# Patient Record
Sex: Female | Born: 1965 | Race: White | Hispanic: No | Marital: Married | Smoking: Never smoker
Health system: Southern US, Community
[De-identification: ages and names within clinical notes are randomized; demographics above are authoritative.]

## PROBLEM LIST (undated history)

## (undated) DIAGNOSIS — K219 Gastro-esophageal reflux disease without esophagitis: Secondary | ICD-10-CM

## (undated) DIAGNOSIS — G4730 Sleep apnea, unspecified: Secondary | ICD-10-CM

## (undated) DIAGNOSIS — C439 Malignant melanoma of skin, unspecified: Secondary | ICD-10-CM

## (undated) DIAGNOSIS — J189 Pneumonia, unspecified organism: Secondary | ICD-10-CM

## (undated) DIAGNOSIS — F32A Depression, unspecified: Secondary | ICD-10-CM

## (undated) DIAGNOSIS — I10 Essential (primary) hypertension: Secondary | ICD-10-CM

## (undated) DIAGNOSIS — F419 Anxiety disorder, unspecified: Secondary | ICD-10-CM

## (undated) DIAGNOSIS — B019 Varicella without complication: Secondary | ICD-10-CM

## (undated) DIAGNOSIS — L509 Urticaria, unspecified: Secondary | ICD-10-CM

## (undated) DIAGNOSIS — F329 Major depressive disorder, single episode, unspecified: Secondary | ICD-10-CM

## (undated) HISTORY — DX: Varicella without complication: B01.9

## (undated) HISTORY — DX: Depression, unspecified: F32.A

## (undated) HISTORY — DX: Gastro-esophageal reflux disease without esophagitis: K21.9

## (undated) HISTORY — DX: Major depressive disorder, single episode, unspecified: F32.9

## (undated) HISTORY — DX: Malignant melanoma of skin, unspecified: C43.9

## (undated) HISTORY — DX: Anxiety disorder, unspecified: F41.9

## (undated) HISTORY — DX: Urticaria, unspecified: L50.9

## (undated) HISTORY — PX: WRIST SURGERY: SHX841

## (undated) HISTORY — PX: TONSILECTOMY/ADENOIDECTOMY WITH MYRINGOTOMY: SHX6125

## (undated) HISTORY — DX: Sleep apnea, unspecified: G47.30

---
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
## 1998-01-31 ENCOUNTER — Encounter: Payer: Self-pay | Admitting: Internal Medicine

## 1998-01-31 ENCOUNTER — Ambulatory Visit (HOSPITAL_COMMUNITY): Admission: RE | Admit: 1998-01-31 | Discharge: 1998-01-31 | Payer: Self-pay | Admitting: Internal Medicine

## 1999-08-30 ENCOUNTER — Ambulatory Visit (HOSPITAL_COMMUNITY): Admission: RE | Admit: 1999-08-30 | Discharge: 1999-08-30 | Payer: Self-pay | Admitting: Internal Medicine

## 1999-08-30 ENCOUNTER — Encounter: Payer: Self-pay | Admitting: Internal Medicine

## 2005-01-17 ENCOUNTER — Ambulatory Visit: Payer: Self-pay | Admitting: Internal Medicine

## 2005-03-26 ENCOUNTER — Ambulatory Visit: Payer: Self-pay | Admitting: Internal Medicine

## 2005-04-05 ENCOUNTER — Ambulatory Visit: Payer: Self-pay | Admitting: Internal Medicine

## 2005-04-11 ENCOUNTER — Ambulatory Visit: Payer: Self-pay | Admitting: Internal Medicine

## 2006-06-03 ENCOUNTER — Ambulatory Visit: Payer: Self-pay | Admitting: Internal Medicine

## 2008-08-24 ENCOUNTER — Encounter: Admission: RE | Admit: 2008-08-24 | Discharge: 2008-08-24 | Payer: Self-pay | Admitting: Obstetrics and Gynecology

## 2008-11-01 ENCOUNTER — Encounter: Admission: RE | Admit: 2008-11-01 | Discharge: 2008-11-01 | Payer: Self-pay | Admitting: Obstetrics and Gynecology

## 2009-09-08 ENCOUNTER — Encounter: Admission: RE | Admit: 2009-09-08 | Discharge: 2009-09-08 | Payer: Self-pay | Admitting: Obstetrics and Gynecology

## 2010-02-26 ENCOUNTER — Encounter: Payer: Self-pay | Admitting: Obstetrics and Gynecology

## 2010-02-27 ENCOUNTER — Encounter: Payer: Self-pay | Admitting: Obstetrics and Gynecology

## 2011-02-06 HISTORY — PX: MELANOMA EXCISION: SHX5266

## 2011-08-13 DIAGNOSIS — C439 Malignant melanoma of skin, unspecified: Secondary | ICD-10-CM | POA: Insufficient documentation

## 2011-08-14 HISTORY — PX: OTHER SURGICAL HISTORY: SHX169

## 2012-09-05 LAB — HM MAMMOGRAPHY: HM Mammogram: NORMAL

## 2012-09-05 LAB — HM PAP SMEAR: HM Pap smear: NORMAL

## 2013-06-01 ENCOUNTER — Telehealth: Payer: Self-pay

## 2013-06-01 NOTE — Telephone Encounter (Signed)
Left message for call back Identifiable  NEW PATIENT

## 2013-06-01 NOTE — Telephone Encounter (Signed)
New patient.  Previous PCP Dr. Tamsen Roers at [facility].  OB/GYN- Dr. Vivianne Master    Medication and allergies:  Reviewed and updated  90 day supply/mail order: n/a Local pharmacy:  [facility] [address]   Immunizations due:  Td/Tdap?     A/P: Personal, family history or past surgical hx: Reviewed and Updated PAP- 09/2012- normal per patient CCS- has never received MMG- 09/2012- normal per patient  Flu- did not receive Tdap- unsure of last vaccine; probably greater than 10 years ago   To Discuss with Provider: Nothing at this time.

## 2013-06-02 ENCOUNTER — Ambulatory Visit: Payer: Self-pay | Admitting: Family Medicine

## 2013-06-02 ENCOUNTER — Encounter: Payer: Self-pay | Admitting: Family Medicine

## 2013-06-02 ENCOUNTER — Ambulatory Visit (INDEPENDENT_AMBULATORY_CARE_PROVIDER_SITE_OTHER): Payer: BC Managed Care – PPO | Admitting: Family Medicine

## 2013-06-02 VITALS — BP 120/82 | HR 97 | Temp 98.4°F | Ht 65.0 in | Wt 215.0 lb

## 2013-06-02 DIAGNOSIS — Z9109 Other allergy status, other than to drugs and biological substances: Secondary | ICD-10-CM

## 2013-06-02 DIAGNOSIS — E669 Obesity, unspecified: Secondary | ICD-10-CM | POA: Insufficient documentation

## 2013-06-02 DIAGNOSIS — F329 Major depressive disorder, single episode, unspecified: Secondary | ICD-10-CM

## 2013-06-02 DIAGNOSIS — F3289 Other specified depressive episodes: Secondary | ICD-10-CM

## 2013-06-02 DIAGNOSIS — E66811 Obesity, class 1: Secondary | ICD-10-CM | POA: Insufficient documentation

## 2013-06-02 DIAGNOSIS — F32A Depression, unspecified: Secondary | ICD-10-CM

## 2013-06-02 MED ORDER — BUPROPION HCL ER (XL) 150 MG PO TB24: 150.0000 mg | ORAL_TABLET | Freq: Every day | ORAL | Status: DC

## 2013-06-02 MED ORDER — OMEPRAZOLE 40 MG PO CPDR: 40.0000 mg | DELAYED_RELEASE_CAPSULE | Freq: Every day | ORAL | Status: DC

## 2013-06-02 NOTE — Progress Notes (Signed)
Pre visit review using our clinic review tool, if applicable. No additional management support is needed unless otherwise documented below in the visit note. 

## 2013-06-02 NOTE — Patient Instructions (Signed)

## 2013-06-02 NOTE — Progress Notes (Signed)
Subjective:    Patient ID: Vicki Le, female    DOB: [birth year], 48 y.o.   MRN: [id number]  HPI Pt here to establish and to discuss depression. She is not suicidal. She was started on lexapro previously but it made her tired.   No other complaints.    Review of Systems    as above  Past Medical History  Diagnosis Date  . Melanoma     left buttock  . Chicken pox   . Depression   . GERD (gastroesophageal reflux disease)    History   Social History  . Marital Status: Married    Spouse Name: N/A    Number of Children: N/A  . Years of Education: N/A   Occupational History  . Not on file.   Social History Main Topics  . Smoking status: Never Smoker   . Smokeless tobacco: Not on file  . Alcohol Use: Not on file  . Drug Use: Not on file  . Sexual Activity: Not on file   Other Topics Concern  . Not on file   Social History Narrative  . No narrative on file   Family History  Problem Relation Age of Onset  . Alzheimer's disease Father   . Colon cancer Father   . Breast cancer      on mother and father side  . Depression    . Hypertension     Current Outpatient Prescriptions  Medication Sig Dispense Refill  . Ascorbic Acid (VITAMIN C) 1000 MG tablet Take 1,000 mg by mouth daily.      . cetirizine (ZYRTEC) 10 MG tablet Take 10 mg by mouth daily.      . cholecalciferol (VITAMIN D) 1000 UNITS tablet Take 1,000 Units by mouth daily.      . Cyanocobalamin (VITAMIN B-12 PO) Take 1 tablet by mouth daily.      . fluticasone (FLONASE) 50 MCG/ACT nasal spray Place 2 sprays into both nostrils daily.      . montelukast (SINGULAIR) 10 MG tablet Take 10 mg by mouth at bedtime.      . norethindrone-ethinyl estradiol (TRIPHASIL,CYCLAFEM,ALYACEN) 0.5/0.75/1-35 MG-MCG tablet Take 1 tablet by mouth daily.      Marland Kitchen omeprazole (PRILOSEC) 40 MG capsule Take 1 capsule (40 mg total) by mouth daily.  90 capsule  3  . valACYclovir (VALTREX) 500 MG tablet Take 500 mg by mouth as  needed.      . zinc gluconate 50 MG tablet Take 50 mg by mouth daily.      Marland Kitchen buPROPion (WELLBUTRIN XL) 150 MG 24 hr tablet Take 1 tablet (150 mg total) by mouth daily. 1 po qd for 1 week then 2 po qam  60 tablet  0   No current facility-administered medications for this visit.    Objective:   Physical Exam  BP 120/82  Pulse 97  Temp(Src) 98.4 F (36.9 C) (Oral)  Ht 5\' 5"  (1.651 m)  Wt 215 lb (97.523 kg)  BMI 35.78 kg/m2  SpO2 99% General appearance: alert, cooperative, appears stated age and no distress Lungs: clear to auscultation bilaterally Heart: S1, S2 normal Extremities: extremities normal, atraumatic, no cyanosis or edema Neurologic: Alert and oriented X 3, normal strength and tone. Normal symmetric reflexes. Normal coordination and gait       Assessment & Plan:  1. Depression Start meds ---list of counselors given to pt - buPROPion (WELLBUTRIN XL) 150 MG 24 hr tablet; Take 1 tablet (150 mg total) by mouth daily.  1 po qd for 1 week then 2 po qam  Dispense: 60 tablet; Refill: 0  2. Sensitivity to sunlight

## 2013-06-12 ENCOUNTER — Telehealth: Payer: Self-pay | Admitting: Internal Medicine

## 2013-06-12 MED ORDER — OSELTAMIVIR PHOSPHATE 75 MG PO CAPS: 75.0000 mg | ORAL_CAPSULE | Freq: Every day | ORAL | Status: DC

## 2013-06-12 NOTE — Telephone Encounter (Signed)
Husband tested + for  influenza today, Vicki Le is here with him, we agreed to start Tamiflu for 10 days. She is currently asymptomatic. If she develops symptoms she will let us know.

## 2013-06-24 ENCOUNTER — Telehealth: Payer: Self-pay | Admitting: Family Medicine

## 2013-06-24 NOTE — Telephone Encounter (Signed)
Caller name:Vicki Le Relation to pt: patient Call back number: [phone] Pharmacy:  Reason for call: patient called and stated that she has been having stomach pain for two days in the mornings. Patient's symptoms are sore abdomin and cramping. Patient thinks that it may be from the Wellbutrin she is on. Please advise.

## 2013-06-24 NOTE — Telephone Encounter (Signed)
Patient said she was feeling a little better, but she is concerned. She has agreed to scale back to one daily and I made her aware if she feels better in the morning to call and we will cancel the apt with no charge, and if she is still feeling bad she can come in, she agreed to do so.   She has also agreed to call back in 1 week for options   KP

## 2013-06-24 NOTE — Telephone Encounter (Signed)
Can Wellbutrin cause stomach pain?      KP

## 2013-06-24 NOTE — Telephone Encounter (Signed)
Pt called back and stated she will call first thing in the morning to schedule an apt to come see Dr. Etter Sjogren.

## 2013-06-24 NOTE — Telephone Encounter (Signed)
Yes it can---  Decrease to 1 a day again for about a week then stop  If it gets better we can discuss different options

## 2013-06-25 ENCOUNTER — Encounter: Payer: Self-pay | Admitting: Family Medicine

## 2013-06-25 ENCOUNTER — Ambulatory Visit (INDEPENDENT_AMBULATORY_CARE_PROVIDER_SITE_OTHER): Payer: BC Managed Care – PPO | Admitting: Family Medicine

## 2013-06-25 VITALS — BP 120/86 | HR 97 | Temp 98.6°F | Resp 16 | Wt 208.0 lb

## 2013-06-25 DIAGNOSIS — K5289 Other specified noninfective gastroenteritis and colitis: Secondary | ICD-10-CM

## 2013-06-25 DIAGNOSIS — K529 Noninfective gastroenteritis and colitis, unspecified: Secondary | ICD-10-CM | POA: Insufficient documentation

## 2013-06-25 LAB — CBC WITH DIFFERENTIAL/PLATELET
BASOS ABS: 0 10*3/uL (ref 0.0–0.1)
BASOS PCT: 0.4 % (ref 0.0–3.0)
EOS ABS: 0.1 10*3/uL (ref 0.0–0.7)
Eosinophils Relative: 1.5 % (ref 0.0–5.0)
HCT: 42.2 % (ref 36.0–46.0)
Hemoglobin: 14.1 g/dL (ref 12.0–15.0)
Lymphocytes Relative: 22.2 % (ref 12.0–46.0)
Lymphs Abs: 2 10*3/uL (ref 0.7–4.0)
MCHC: 33.4 g/dL (ref 30.0–36.0)
MCV: 92.8 fl (ref 78.0–100.0)
MONO ABS: 0.6 10*3/uL (ref 0.1–1.0)
Monocytes Relative: 6.5 % (ref 3.0–12.0)
NEUTROS PCT: 69.4 % (ref 43.0–77.0)
Neutro Abs: 6.4 10*3/uL (ref 1.4–7.7)
Platelets: 258 10*3/uL (ref 150.0–400.0)
RBC: 4.55 Mil/uL (ref 3.87–5.11)
RDW: 13.1 % (ref 11.5–15.5)
WBC: 9.2 10*3/uL (ref 4.0–10.5)

## 2013-06-25 LAB — BASIC METABOLIC PANEL
BUN: 8 mg/dL (ref 6–23)
CO2: 28 mEq/L (ref 19–32)
Calcium: 9.5 mg/dL (ref 8.4–10.5)
Chloride: 104 mEq/L (ref 96–112)
Creatinine, Ser: 0.7 mg/dL (ref 0.4–1.2)
GFR: 99.6 mL/min (ref >60.00)
GLUCOSE: 89 mg/dL (ref 70–99)
Potassium: 4.4 mEq/L (ref 3.5–5.1)
Sodium: 141 mEq/L (ref 135–145)

## 2013-06-25 LAB — HEPATIC FUNCTION PANEL
ALK PHOS: 40 U/L (ref 39–117)
ALT: 11 U/L (ref 0–35)
AST: 14 U/L (ref 0–37)
Albumin: 4.2 g/dL (ref 3.5–5.2)
BILIRUBIN DIRECT: 0 mg/dL (ref 0.0–0.3)
BILIRUBIN TOTAL: 0.5 mg/dL (ref 0.2–1.2)
TOTAL PROTEIN: 7.2 g/dL (ref 6.0–8.3)

## 2013-06-25 NOTE — Assessment & Plan Note (Signed)
New.  No red flags on hx or PE.  Suspect viral process.  Will check labs to r/o biliary disorder (which is what pt fears due to color of stool), infection.  Start immodium prn.  Reviewed supportive care and red flags that should prompt return.  Pt expressed understanding and is in agreement w/ plan.

## 2013-06-25 NOTE — Progress Notes (Signed)
   Subjective:    Patient ID: Vicki Le, female    DOB: 05/07/1965, 48 y.o.   MRN: [id number]  HPI abd pain- pt reports she increased her Wellbutrin 2 weeks ago to 2 tabs daily.  Pt reports diffuse abd spasm/cramping w/ diarrhea.  Stool turned bright yellow.  sxs started 4 days ago- this was more than 1 week after increasing Wellbutrin dose.  No fevers.  No known sick contacts.   Review of Systems For ROS see HPI     Objective:   Physical Exam  Vitals reviewed. Constitutional: She is oriented to person, place, and time. She appears well-developed and well-nourished. No distress.  HENT:  Head: Normocephalic and atraumatic.  MMM  Neck: Neck supple.  Cardiovascular: Normal rate, regular rhythm and intact distal pulses.   Pulmonary/Chest: Effort normal and breath sounds normal. No respiratory distress. She has no wheezes. She has no rales.  Abdominal: Soft. She exhibits no distension. There is no tenderness. There is no rebound.  Hyperactive BS  Lymphadenopathy:    She has no cervical adenopathy.  Neurological: She is alert and oriented to person, place, and time.  Skin: Skin is warm and dry.          Assessment & Plan:

## 2013-06-25 NOTE — Patient Instructions (Signed)
Follow up as needed We'll notify you of your lab results and make any changes if needed Drink plenty of fluids! Eat as you are able REST! Immodium as needed for diarrhea and abdominal spasm Call with any questions or concerns Hang in there! [place] Day!

## 2013-06-25 NOTE — Progress Notes (Signed)
Pre visit review using our clinic review tool, if applicable. No additional management support is needed unless otherwise documented below in the visit note. 

## 2013-06-26 ENCOUNTER — Encounter: Payer: Self-pay | Admitting: General Practice

## 2013-07-07 ENCOUNTER — Ambulatory Visit (INDEPENDENT_AMBULATORY_CARE_PROVIDER_SITE_OTHER): Payer: BC Managed Care – PPO | Admitting: Family Medicine

## 2013-07-07 ENCOUNTER — Encounter: Payer: Self-pay | Admitting: Family Medicine

## 2013-07-07 VITALS — BP 114/76 | HR 99 | Temp 98.1°F | Wt 211.0 lb

## 2013-07-07 DIAGNOSIS — F329 Major depressive disorder, single episode, unspecified: Secondary | ICD-10-CM

## 2013-07-07 DIAGNOSIS — F3289 Other specified depressive episodes: Secondary | ICD-10-CM

## 2013-07-07 MED ORDER — BUPROPION HCL ER (XL) 300 MG PO TB24: 300.0000 mg | ORAL_TABLET | Freq: Every day | ORAL | Status: DC

## 2013-07-07 NOTE — Progress Notes (Signed)
Pre visit review using our clinic review tool, if applicable. No additional management support is needed unless otherwise documented below in the visit note. 

## 2013-07-07 NOTE — Progress Notes (Signed)
   Subjective:    Patient ID: Vicki Le, female    DOB: 17-Mar-1965, 48 y.o.   MRN: [id number]  HPI Pt here to f/u depression and wellbutrin.  She is doing better and would like to go back up to 300 mg.  She thought it was making her sick but she had the stomach flu.   [yReview of Systems    as above Objective:   Physical Exam BP 114/76  Pulse 99  Temp(Src) 98.1 F (36.7 C) (Oral)  Wt 211 lb (95.709 kg)  SpO2 97%  LMP 06/16/2013 General appearance: alert, cooperative, appears stated age and no distress Neurologic: Alert and oriented X 3, normal strength and tone. Normal symmetric reflexes. Normal coordination and gait       Assessment & Plan:  1. Depressive disorder, not elsewhere classified Increase to 300 mg -- call with any ? Or concerns - buPROPion (WELLBUTRIN XL) 300 MG 24 hr tablet; Take 1 tablet (300 mg total) by mouth daily.  Dispense: 90 tablet; Refill: 1

## 2013-07-07 NOTE — Patient Instructions (Signed)

## 2013-12-28 ENCOUNTER — Other Ambulatory Visit: Payer: Self-pay | Admitting: Family Medicine

## 2014-01-08 ENCOUNTER — Encounter: Payer: Self-pay | Admitting: Family Medicine

## 2014-01-08 ENCOUNTER — Encounter: Payer: Self-pay | Admitting: Internal Medicine

## 2014-01-08 ENCOUNTER — Ambulatory Visit (INDEPENDENT_AMBULATORY_CARE_PROVIDER_SITE_OTHER): Payer: BC Managed Care – PPO | Admitting: Family Medicine

## 2014-01-08 VITALS — BP 122/76 | HR 92 | Temp 98.4°F | Wt 193.8 lb

## 2014-01-08 DIAGNOSIS — K648 Other hemorrhoids: Secondary | ICD-10-CM

## 2014-01-08 DIAGNOSIS — K589 Irritable bowel syndrome without diarrhea: Secondary | ICD-10-CM

## 2014-01-08 DIAGNOSIS — K219 Gastro-esophageal reflux disease without esophagitis: Secondary | ICD-10-CM

## 2014-01-08 DIAGNOSIS — G8929 Other chronic pain: Secondary | ICD-10-CM

## 2014-01-08 DIAGNOSIS — R1013 Epigastric pain: Secondary | ICD-10-CM

## 2014-01-08 DIAGNOSIS — K644 Residual hemorrhoidal skin tags: Secondary | ICD-10-CM

## 2014-01-08 LAB — BASIC METABOLIC PANEL
BUN: 10 mg/dL (ref 6–23)
CO2: 27 mEq/L (ref 19–32)
CREATININE: 0.67 mg/dL (ref 0.50–1.10)
Calcium: 9.4 mg/dL (ref 8.4–10.5)
Chloride: 102 mEq/L (ref 96–112)
Glucose, Bld: 96 mg/dL (ref 70–99)
POTASSIUM: 3.7 meq/L (ref 3.5–5.3)
Sodium: 139 mEq/L (ref 135–145)

## 2014-01-08 LAB — CBC WITH DIFFERENTIAL/PLATELET
BASOS ABS: 0 10*3/uL (ref 0.0–0.1)
Basophils Relative: 0 % (ref 0–1)
Eosinophils Absolute: 0.2 10*3/uL (ref 0.0–0.7)
Eosinophils Relative: 2 % (ref 0–5)
HCT: 39.8 % (ref 36.0–46.0)
HEMOGLOBIN: 13.7 g/dL (ref 12.0–15.0)
LYMPHS PCT: 28 % (ref 12–46)
Lymphs Abs: 2.7 10*3/uL (ref 0.7–4.0)
MCH: 30.8 pg (ref 26.0–34.0)
MCHC: 34.4 g/dL (ref 30.0–36.0)
MCV: 89.4 fL (ref 78.0–100.0)
MPV: 10.3 fL (ref 9.4–12.4)
Monocytes Absolute: 0.9 10*3/uL (ref 0.1–1.0)
Monocytes Relative: 9 % (ref 3–12)
NEUTROS ABS: 5.8 10*3/uL (ref 1.7–7.7)
Neutrophils Relative %: 61 % (ref 43–77)
PLATELETS: 314 10*3/uL (ref 150–400)
RBC: 4.45 MIL/uL (ref 3.87–5.11)
RDW: 12.7 % (ref 11.5–15.5)
WBC: 9.5 10*3/uL (ref 4.0–10.5)

## 2014-01-08 LAB — HEPATIC FUNCTION PANEL
ALT: 10 U/L (ref 0–35)
AST: 10 U/L (ref 0–37)
Albumin: 4.1 g/dL (ref 3.5–5.2)
Alkaline Phosphatase: 44 U/L (ref 39–117)
BILIRUBIN DIRECT: 0.1 mg/dL (ref 0.0–0.3)
BILIRUBIN INDIRECT: 0.2 mg/dL (ref 0.2–1.2)
BILIRUBIN TOTAL: 0.3 mg/dL (ref 0.2–1.2)
Total Protein: 6.6 g/dL (ref 6.0–8.3)

## 2014-01-08 LAB — LIPASE: LIPASE: 40 U/L (ref 0–75)

## 2014-01-08 LAB — AMYLASE: AMYLASE: 22 U/L (ref 0–105)

## 2014-01-08 MED ORDER — DEXLANSOPRAZOLE 60 MG PO CPDR
60.0000 mg | DELAYED_RELEASE_CAPSULE | Freq: Every day | ORAL | Status: DC
Start: 2014-01-08 — End: 2014-09-27

## 2014-01-08 MED ORDER — HYDROCORTISONE ACE-PRAMOXINE 1-1 % RE FOAM: 1.0000 | Freq: Two times a day (BID) | RECTAL | Status: DC

## 2014-01-08 MED ORDER — HYDROCORTISONE 2.5 % RE CREA: 1.0000 "application " | TOPICAL_CREAM | Freq: Two times a day (BID) | RECTAL | Status: DC

## 2014-01-08 NOTE — Progress Notes (Signed)
  Subjective:     Vicki Le is a 48 y.o. female who presents for evaluation of abdominal pain. Onset was 3 weeks ago. Symptoms have been gradually worsening. The pain is described as cramping and dull, and is 4/10 in intensity. Pain is located in the epigastric region without radiation.  Aggravating factors: none.  Alleviating factors: not eating. Associated symptoms: belching and flatus. The patient denies arthralagias, chills, constipation, diarrhea, dysuria, fever, frequency, headache, hematochezia, hematuria, melena, myalgias, nausea, sweats and vomiting.  The patient's history has been marked as reviewed and updated as appropriate.  Review of Systems Pertinent items are noted in HPI.     Objective:    BP 122/76 mmHg  Pulse 92  Temp(Src) 98.4 F (36.9 C) (Oral)  Wt 193 lb 12.8 oz (87.907 kg)  SpO2 97% General appearance: alert, cooperative, appears stated age and no distress Lungs: clear to auscultation bilaterally Abdomen: abnormal findings:  moderate tenderness in the epigastrium    Assessment:    Abdominal pain,---  gerd vs IBS .    Plan:    See orders for lab and imaging studies. Adhere to simple, bland diet. Initiate empiric trial of fiber therapy. Further follow-up plans will be based on outcome of lab/imaging studies; see orders. Follow up as needed. dexilant

## 2014-01-08 NOTE — Patient Instructions (Addendum)
Food Choices for Gastroesophageal Reflux Disease When you have gastroesophageal reflux disease (GERD), the foods you eat and your eating habits are very important. Choosing the right foods can help ease the discomfort of GERD. WHAT GENERAL GUIDELINES DO I NEED TO FOLLOW?  Choose fruits, vegetables, whole grains, low-fat dairy products, and low-fat meat, fish, and poultry.  Limit fats such as oils, salad dressings, butter, nuts, and avocado.  Keep a food diary to identify foods that cause symptoms.  Avoid foods that cause reflux. These may be different for different people.  Eat frequent small meals instead of three large meals each day.  Eat your meals slowly, in a relaxed setting.  Limit fried foods.  Cook foods using methods other than frying.  Avoid drinking alcohol.  Avoid drinking large amounts of liquids with your meals.  Avoid bending over or lying down until 2-3 hours after eating. WHAT FOODS ARE NOT RECOMMENDED? The following are some foods and drinks that may worsen your symptoms: Vegetables Tomatoes. Tomato juice. Tomato and spaghetti sauce. Chili peppers. Onion and garlic. Horseradish. Fruits Oranges, grapefruit, and lemon (fruit and juice). Meats High-fat meats, fish, and poultry. This includes hot dogs, ribs, ham, sausage, salami, and bacon. Dairy Whole milk and chocolate milk. Sour cream. Cream. Butter. Ice cream. Cream cheese.  Beverages Coffee and tea, with or without caffeine. Carbonated beverages or energy drinks. Condiments Hot sauce. Barbecue sauce.  Sweets/Desserts Chocolate and cocoa. Donuts. Peppermint and spearmint. Fats and Oils High-fat foods, including [place] fries and potato chips. Other Vinegar. Strong spices, such as black pepper, white pepper, red pepper, cayenne, curry powder, cloves, ginger, and chili powder. The items listed above may not be a complete list of foods and beverages to avoid. Contact your dietitian for more  information. Document Released: 01/22/2005 Document Revised: 01/27/2013 Document Reviewed: 11/26/2012 [facility] Patient Information 2015 [place], [place]. This information is not intended to replace advice given to you by your health care provider. Make sure you discuss any questions you have with your health care provider.  Hemorrhoids Hemorrhoids are swollen veins around the rectum or anus. There are two types of hemorrhoids:   Internal hemorrhoids. These occur in the veins just inside the rectum. They may poke through to the outside and become irritated and painful.  External hemorrhoids. These occur in the veins outside the anus and can be felt as a painful swelling or hard lump near the anus. CAUSES  Pregnancy.   Obesity.   Constipation or diarrhea.   Straining to have a bowel movement.   Sitting for long periods on the toilet.  Heavy lifting or other activity that caused you to strain.  Anal intercourse. SYMPTOMS   Pain.   Anal itching or irritation.   Rectal bleeding.   Fecal leakage.   Anal swelling.   One or more lumps around the anus.  DIAGNOSIS  Your caregiver may be able to diagnose hemorrhoids by visual examination. Other examinations or tests that may be performed include:   Examination of the rectal area with a gloved hand (digital rectal exam).   Examination of anal canal using a small tube (scope).   A blood test if you have lost a significant amount of blood.  A test to look inside the colon (sigmoidoscopy or colonoscopy). TREATMENT Most hemorrhoids can be treated at home. However, if symptoms do not seem to be getting better or if you have a lot of rectal bleeding, your caregiver may perform a procedure to help make the hemorrhoids get  smaller or remove them completely. Possible treatments include:   Placing a rubber band at the base of the hemorrhoid to cut off the circulation (rubber band ligation).   Injecting a chemical to shrink  the hemorrhoid (sclerotherapy).   Using a tool to burn the hemorrhoid (infrared light therapy).   Surgically removing the hemorrhoid (hemorrhoidectomy).   Stapling the hemorrhoid to block blood flow to the tissue (hemorrhoid stapling).  HOME CARE INSTRUCTIONS   Eat foods with fiber, such as whole grains, beans, nuts, fruits, and vegetables. Ask your doctor about taking products with added fiber in them (fibersupplements).  Increase fluid intake. Drink enough water and fluids to keep your urine clear or pale yellow.   Exercise regularly.   Go to the bathroom when you have the urge to have a bowel movement. Do not wait.   Avoid straining to have bowel movements.   Keep the anal area dry and clean. Use wet toilet paper or moist towelettes after a bowel movement.   Medicated creams and suppositories may be used or applied as directed.   Only take over-the-counter or prescription medicines as directed by your caregiver.   Take warm sitz baths for 15-20 minutes, 3-4 times a day to ease pain and discomfort.   Place ice packs on the hemorrhoids if they are tender and swollen. Using ice packs between sitz baths may be helpful.   Put ice in a plastic bag.   Place a towel between your skin and the bag.   Leave the ice on for 15-20 minutes, 3-4 times a day.   Do not use a donut-shaped pillow or sit on the toilet for long periods. This increases blood pooling and pain.  SEEK MEDICAL CARE IF:  You have increasing pain and swelling that is not controlled by treatment or medicine.  You have uncontrolled bleeding.  You have difficulty or you are unable to have a bowel movement.  You have pain or inflammation outside the area of the hemorrhoids. MAKE SURE YOU:  Understand these instructions.  Will watch your condition.  Will get help right away if you are not doing well or get worse. Document Released: 01/20/2000 Document Revised: 01/09/2012 Document Reviewed:  11/27/2011 [facility] Patient Information 2015 [place], [place]. This information is not intended to replace advice given to you by your health care provider. Make sure you discuss any questions you have with your health care provider.

## 2014-01-08 NOTE — Progress Notes (Signed)
Pre visit review using our clinic review tool, if applicable. No additional management support is needed unless otherwise documented below in the visit note. 

## 2014-01-09 LAB — TSH: TSH: 1.402 u[IU]/mL (ref 0.350–4.500)

## 2014-01-11 LAB — H. PYLORI ANTIBODY, IGG: H Pylori IgG: 0.62 {ISR}

## 2014-02-03 ENCOUNTER — Telehealth: Payer: Self-pay | Admitting: *Deleted

## 2014-02-03 NOTE — Telephone Encounter (Signed)
Prior authorization for dexilant initiated. Awaiting determination. JG//CMA

## 2014-03-03 ENCOUNTER — Ambulatory Visit: Payer: BC Managed Care – PPO | Admitting: Internal Medicine

## 2014-03-28 ENCOUNTER — Other Ambulatory Visit: Payer: Self-pay | Admitting: Family Medicine

## 2014-05-31 ENCOUNTER — Other Ambulatory Visit: Payer: Self-pay | Admitting: Family Medicine

## 2014-06-29 ENCOUNTER — Other Ambulatory Visit: Payer: Self-pay

## 2014-06-29 MED ORDER — MONTELUKAST SODIUM 10 MG PO TABS: 10.0000 mg | ORAL_TABLET | Freq: Every day | ORAL | Status: DC

## 2014-07-03 ENCOUNTER — Other Ambulatory Visit: Payer: Self-pay | Admitting: Family Medicine

## 2014-07-06 ENCOUNTER — Other Ambulatory Visit: Payer: Self-pay

## 2014-07-06 MED ORDER — MONTELUKAST SODIUM 10 MG PO TABS: 10.0000 mg | ORAL_TABLET | Freq: Every day | ORAL | Status: DC

## 2014-09-27 ENCOUNTER — Ambulatory Visit (HOSPITAL_BASED_OUTPATIENT_CLINIC_OR_DEPARTMENT_OTHER)
Admission: RE | Admit: 2014-09-27 | Discharge: 2014-09-27 | Disposition: A | Discharge status: Home / Self Care | Payer: BLUE CROSS/BLUE SHIELD | Source: Ambulatory Visit | Attending: Family Medicine | Admitting: Family Medicine

## 2014-09-27 ENCOUNTER — Other Ambulatory Visit: Payer: Self-pay | Admitting: Family Medicine

## 2014-09-27 ENCOUNTER — Encounter (HOSPITAL_BASED_OUTPATIENT_CLINIC_OR_DEPARTMENT_OTHER): Payer: Self-pay

## 2014-09-27 ENCOUNTER — Encounter: Payer: Self-pay | Admitting: Family Medicine

## 2014-09-27 ENCOUNTER — Telehealth: Payer: Self-pay | Admitting: Family Medicine

## 2014-09-27 ENCOUNTER — Ambulatory Visit (INDEPENDENT_AMBULATORY_CARE_PROVIDER_SITE_OTHER): Payer: BLUE CROSS/BLUE SHIELD | Admitting: Family Medicine

## 2014-09-27 VITALS — BP 122/86 | Temp 98.2°F | Ht 65.0 in | Wt 198.0 lb

## 2014-09-27 DIAGNOSIS — R079 Chest pain, unspecified: Secondary | ICD-10-CM

## 2014-09-27 DIAGNOSIS — R1032 Left lower quadrant pain: Secondary | ICD-10-CM | POA: Insufficient documentation

## 2014-09-27 DIAGNOSIS — R197 Diarrhea, unspecified: Secondary | ICD-10-CM

## 2014-09-27 LAB — CBC WITH DIFFERENTIAL/PLATELET
BASOS ABS: 0 10*3/uL (ref 0.0–0.1)
Basophils Relative: 0.4 % (ref 0.0–3.0)
EOS PCT: 1.1 % (ref 0.0–5.0)
Eosinophils Absolute: 0.1 10*3/uL (ref 0.0–0.7)
HCT: 40.1 % (ref 36.0–46.0)
Hemoglobin: 13.6 g/dL (ref 12.0–15.0)
LYMPHS ABS: 1.6 10*3/uL (ref 0.7–4.0)
Lymphocytes Relative: 19.4 % (ref 12.0–46.0)
MCHC: 33.8 g/dL (ref 30.0–36.0)
MCV: 93.5 fl (ref 78.0–100.0)
MONO ABS: 0.4 10*3/uL (ref 0.1–1.0)
MONOS PCT: 4.9 % (ref 3.0–12.0)
NEUTROS ABS: 5.9 10*3/uL (ref 1.4–7.7)
NEUTROS PCT: 74.2 % (ref 43.0–77.0)
PLATELETS: 282 10*3/uL (ref 150.0–400.0)
RBC: 4.29 Mil/uL (ref 3.87–5.11)
RDW: 12.7 % (ref 11.5–15.5)
WBC: 8 10*3/uL (ref 4.0–10.5)

## 2014-09-27 LAB — POCT URINALYSIS DIPSTICK
BILIRUBIN UA: NEGATIVE
Blood, UA: NEGATIVE
GLUCOSE UA: NEGATIVE
LEUKOCYTES UA: NEGATIVE
NITRITE UA: NEGATIVE
PH UA: 6
Protein, UA: NEGATIVE
Spec Grav, UA: 1.015
Urobilinogen, UA: 2

## 2014-09-27 LAB — COMPREHENSIVE METABOLIC PANEL
ALT: 8 U/L (ref 0–35)
AST: 9 U/L (ref 0–37)
Albumin: 4.2 g/dL (ref 3.5–5.2)
Alkaline Phosphatase: 37 U/L — ABNORMAL LOW (ref 39–117)
BUN: 8 mg/dL (ref 6–23)
CHLORIDE: 103 meq/L (ref 96–112)
CO2: 27 meq/L (ref 19–32)
Calcium: 9.1 mg/dL (ref 8.4–10.5)
Creatinine, Ser: 0.59 mg/dL (ref 0.40–1.20)
GFR: 114.74 mL/min (ref >60.00)
GLUCOSE: 103 mg/dL — AB (ref 70–99)
POTASSIUM: 3.6 meq/L (ref 3.5–5.1)
Sodium: 138 mEq/L (ref 135–145)
Total Bilirubin: 0.4 mg/dL (ref 0.2–1.2)
Total Protein: 6.9 g/dL (ref 6.0–8.3)

## 2014-09-27 LAB — H. PYLORI ANTIBODY, IGG: H Pylori IgG: NEGATIVE

## 2014-09-27 LAB — AMYLASE: AMYLASE: 20 U/L — AB (ref 27–131)

## 2014-09-27 LAB — LIPASE: Lipase: 23 U/L (ref 11.0–59.0)

## 2014-09-27 MED ORDER — IOHEXOL 300 MG/ML  SOLN
100.0000 mL | Freq: Once | INTRAMUSCULAR | Status: AC | PRN
  Administered 2014-09-27: 100 mL via INTRAVENOUS

## 2014-09-27 MED ORDER — GI COCKTAIL ~~LOC~~
30.0000 mL | Freq: Once | ORAL | Status: AC
  Administered 2014-09-27: 30 mL via ORAL

## 2014-09-27 MED ORDER — PANTOPRAZOLE SODIUM 40 MG PO TBEC: 40.0000 mg | DELAYED_RELEASE_TABLET | Freq: Every day | ORAL | Status: DC

## 2014-09-27 NOTE — Telephone Encounter (Signed)
See ct  

## 2014-09-27 NOTE — Telephone Encounter (Signed)
°  Relation to GC:YOYO Call back [phone] Pharmacy:  Reason for call: pt states she had a ct-scan done today, states that imaging downstairs informed her to call if she had not heard anything before 5, pt would like to know if results are back

## 2014-09-27 NOTE — Telephone Encounter (Signed)
To MD for review     KP 

## 2014-09-27 NOTE — Progress Notes (Signed)
Patient ID: Vicki Le, female   DOB: 12-03-1965, 49 y.o.   MRN: [id number]   Subjective:    Patient ID: Vicki Le, female    DOB: [birth year], 49 y.o.   MRN: [id number]  Chief Complaint  Patient presents with  . GI Problem    x's 5 days c/o bloating, excessive burping, abdominal discomfort and feels like needles are sticking her in her chest and abdomen, loose stolols and lack of appetite.  . Fatigue    HPI Patient is in today for c/o bloating, burping x 5 days . No nvd.  She is also very tired.    Past Medical History  Diagnosis Date  . Melanoma     left buttock  . Chicken pox   . Depression   . GERD (gastroesophageal reflux disease)     Past Surgical History  Procedure Laterality Date  . Skin cancer removal  August 14, 2011    2 lymph nodes removed as well  . Tonsilectomy/adenoidectomy with myringotomy    . Wrist surgery  1991/1192    ganglion cyst r wrist  . Melanoma excision  2013    Family History  Problem Relation Age of Onset  . Alzheimer's disease Father   . Colon cancer Father   . Breast cancer      on mother and father side  . Depression    . Hypertension      Social History   Social History  . Marital Status: Married    Spouse Name: N/A  . Number of Children: N/A  . Years of Education: N/A   Occupational History  . Not on file.   Social History Main Topics  . Smoking status: Never Smoker   . Smokeless tobacco: Not on file  . Alcohol Use: Not on file  . Drug Use: Not on file  . Sexual Activity: Not on file   Other Topics Concern  . Not on file   Social History Narrative    Outpatient Prescriptions Prior to Visit  Medication Sig Dispense Refill  . ALLERGY 10 MG tablet Take 10 mg by mouth every morning.  3  . Ascorbic Acid (VITAMIN C) 1000 MG tablet Take 1,000 mg by mouth daily.    Marland Kitchen buPROPion (WELLBUTRIN XL) 300 MG 24 hr tablet TAKE 1 TABLET (300 MG TOTAL) BY MOUTH DAILY. 90 tablet 1  . cetirizine (ZYRTEC) 10 MG tablet Take 10 mg  by mouth daily.    . cholecalciferol (VITAMIN D) 1000 UNITS tablet Take 1,000 Units by mouth daily.    . Cyanocobalamin (VITAMIN B-12 PO) Take 1 tablet by mouth daily.    . fluticasone (FLONASE) 50 MCG/ACT nasal spray Place 2 sprays into both nostrils daily.    . hydrocortisone (ANUSOL-HC) 2.5 % rectal cream Place 1 application rectally 2 (two) times daily. 30 g 0  . hydrocortisone-pramoxine (PROCTOFOAM HC) rectal foam Place 1 applicator rectally 2 (two) times daily. 10 g 0  . montelukast (SINGULAIR) 10 MG tablet Take 1 tablet (10 mg total) by mouth at bedtime. 90 tablet 3  . norethindrone-ethinyl estradiol (TRIPHASIL,CYCLAFEM,ALYACEN) 0.5/0.75/1-35 MG-MCG tablet Take 1 tablet by mouth daily.    Marland Kitchen omeprazole (PRILOSEC) 40 MG capsule TAKE 1 CAPSULE (40 MG TOTAL) BY MOUTH DAILY. 90 capsule 3  . ranitidine (ZANTAC) 150 MG capsule Take 150 mg by mouth 2 (two) times daily.  1  . valACYclovir (VALTREX) 500 MG tablet Take 500 mg by mouth as needed.    . zinc gluconate 50  MG tablet Take 50 mg by mouth daily.    Marland Kitchen dexlansoprazole (DEXILANT) 60 MG capsule Take 1 capsule (60 mg total) by mouth daily. 30 capsule 11   No facility-administered medications prior to visit.    Allergies  Allergen Reactions  . Vicodin [Hydrocodone-Acetaminophen] Nausea And Vomiting    Review of Systems  Constitutional: Positive for malaise/fatigue. Negative for fever.  HENT: Negative for congestion.   Eyes: Negative for discharge.  Respiratory: Negative for shortness of breath.   Cardiovascular: Negative for chest pain, palpitations and leg swelling.  Gastrointestinal: Positive for heartburn, nausea, abdominal pain and diarrhea.  Genitourinary: Negative for dysuria.  Musculoskeletal: Negative for falls.  Skin: Negative for rash.  Neurological: Positive for headaches. Negative for loss of consciousness.  Endo/Heme/Allergies: Negative for environmental allergies.  Psychiatric/Behavioral: Negative for depression. The  patient is not nervous/anxious.        Objective:    Physical Exam  Constitutional: She is oriented to person, place, and time. She appears well-developed and well-nourished.  HENT:  Head: Normocephalic and atraumatic.  Eyes: Conjunctivae and EOM are normal.  Neck: Normal range of motion. Neck supple. No JVD present. Carotid bruit is not present. No thyromegaly present.  Cardiovascular: Normal rate, regular rhythm and normal heart sounds.   No murmur heard. Pulmonary/Chest: Effort normal and breath sounds normal. No respiratory distress. She has no wheezes. She has no rales. She exhibits no tenderness.  Musculoskeletal: She exhibits no edema.  Neurological: She is alert and oriented to person, place, and time.  Psychiatric: She has a normal mood and affect. Her behavior is normal.    BP 122/86 mmHg  Temp(Src) 98.2 F (36.8 C) (Oral)  Ht 5\' 5"  (1.651 m)  Wt 198 lb (89.812 kg)  BMI 32.95 kg/m2 Wt Readings from Last 3 Encounters:  09/27/14 198 lb (89.812 kg)  01/08/14 193 lb 12.8 oz (87.907 kg)  07/07/13 211 lb (95.709 kg)     Lab Results  Component Value Date   WBC 9.5 01/08/2014   HGB 13.7 01/08/2014   HCT 39.8 01/08/2014   PLT 314 01/08/2014   GLUCOSE 96 01/08/2014   ALT 10 01/08/2014   AST 10 01/08/2014   NA 139 01/08/2014   K 3.7 01/08/2014   CL 102 01/08/2014   CREATININE 0.67 01/08/2014   BUN 10 01/08/2014   CO2 27 01/08/2014   TSH 1.402 01/08/2014    Lab Results  Component Value Date   TSH 1.402 01/08/2014   Lab Results  Component Value Date   WBC 9.5 01/08/2014   HGB 13.7 01/08/2014   HCT 39.8 01/08/2014   MCV 89.4 01/08/2014   PLT 314 01/08/2014   Lab Results  Component Value Date   NA 139 01/08/2014   K 3.7 01/08/2014   CO2 27 01/08/2014   GLUCOSE 96 01/08/2014   BUN 10 01/08/2014   CREATININE 0.67 01/08/2014   BILITOT 0.3 01/08/2014   ALKPHOS 44 01/08/2014   AST 10 01/08/2014   ALT 10 01/08/2014   PROT 6.6 01/08/2014   ALBUMIN 4.1  01/08/2014   CALCIUM 9.4 01/08/2014   GFR 99.60 06/25/2013   No results found for: CHOL No results found for: HDL No results found for: LDLCALC No results found for: TRIG No results found for: CHOLHDL No results found for: HGBA1C     Assessment & Plan:   Problem List Items Addressed This Visit    None    Visit Diagnoses    Chest pain, unspecified chest pain  type    -  Primary    Relevant Orders    EKG 12-Lead (Completed)     1. Chest pain, unspecified chest pain type ? gerd - EKG 12-Lead - Amylase - Lipase - CBC with Differential/Platelet - Comprehensive metabolic panel - POCT urinalysis dipstick - H. pylori antibody, IgG - gi cocktail (Maalox,Lidocaine,Donnatal); Take 30 mLs by mouth once.  2. LLQ pain   - CT Abdomen Pelvis W Contrast; Future - Amylase - Lipase - CBC with Differential/Platelet - Comprehensive metabolic panel - POCT urinalysis dipstick - H. pylori antibody, IgG - gi cocktail (Maalox,Lidocaine,Donnatal); Take 30 mLs by mouth once.  3. Diarrhea   - CT Abdomen Pelvis W Contrast; Future - Amylase - Lipase - CBC with Differential/Platelet - Comprehensive metabolic panel - POCT urinalysis dipstick - H. pylori antibody, IgG - gi cocktail (Maalox,Lidocaine,Donnatal); Take 30 mLs by mouth once.   I have discontinued Ms. Arcand's dexlansoprazole. I am also having her maintain her norethindrone-ethinyl estradiol, valACYclovir, Cyanocobalamin (VITAMIN B-12 PO), cholecalciferol, vitamin C, zinc gluconate, fluticasone, cetirizine, ranitidine, ALLERGY, hydrocortisone, hydrocortisone-pramoxine, buPROPion, omeprazole, and montelukast.  No orders of the defined types were placed in this encounter.     Elizabeth Sauer, LPN

## 2014-09-27 NOTE — Progress Notes (Signed)
Pre visit review using our clinic review tool, if applicable. No additional management support is needed unless otherwise documented below in the visit note. 

## 2014-09-27 NOTE — Telephone Encounter (Signed)
Notes Recorded by Rosalita Chessman, DO on 09/27/2014 at 5:22 PM Normal   Patient has been made aware and verbalized understanding.       KP

## 2014-09-27 NOTE — Patient Instructions (Signed)
Gastroesophageal Reflux Disease, Adult Gastroesophageal reflux disease (GERD) happens when acid from your stomach flows up into the esophagus. When acid comes in contact with the esophagus, the acid causes soreness (inflammation) in the esophagus. Over time, GERD may create small holes (ulcers) in the lining of the esophagus. CAUSES   Increased body weight. This puts pressure on the stomach, making acid rise from the stomach into the esophagus.  Smoking. This increases acid production in the stomach.  Drinking alcohol. This causes decreased pressure in the lower esophageal sphincter (valve or ring of muscle between the esophagus and stomach), allowing acid from the stomach into the esophagus.  Late evening meals and a full stomach. This increases pressure and acid production in the stomach.  A malformed lower esophageal sphincter. Sometimes, no cause is found. SYMPTOMS   Burning pain in the lower part of the mid-chest behind the breastbone and in the mid-stomach area. This may occur twice a week or more often.  Trouble swallowing.  Sore throat.  Dry cough.  Asthma-like symptoms including chest tightness, shortness of breath, or wheezing. DIAGNOSIS  Your caregiver may be able to diagnose GERD based on your symptoms. In some cases, X-rays and other tests may be done to check for complications or to check the condition of your stomach and esophagus. TREATMENT  Your caregiver may recommend over-the-counter or prescription medicines to help decrease acid production. Ask your caregiver before starting or adding any new medicines.  HOME CARE INSTRUCTIONS   Change the factors that you can control. Ask your caregiver for guidance concerning weight loss, quitting smoking, and alcohol consumption.  Avoid foods and drinks that make your symptoms worse, such as:  Caffeine or alcoholic drinks.  Chocolate.  Peppermint or mint flavorings.  Garlic and onions.  Spicy foods.  Citrus fruits,  such as oranges, lemons, or limes.  Tomato-based foods such as sauce, chili, salsa, and pizza.  Fried and fatty foods.  Avoid lying down for the 3 hours prior to your bedtime or prior to taking a nap.  Eat small, frequent meals instead of large meals.  Wear loose-fitting clothing. Do not wear anything tight around your waist that causes pressure on your stomach.  Raise the head of your bed 6 to 8 inches with wood blocks to help you sleep. Extra pillows will not help.  Only take over-the-counter or prescription medicines for pain, discomfort, or fever as directed by your caregiver.  Do not take aspirin, ibuprofen, or other nonsteroidal anti-inflammatory drugs (NSAIDs). SEEK IMMEDIATE MEDICAL CARE IF:   You have pain in your arms, neck, jaw, teeth, or back.  Your pain increases or changes in intensity or duration.  You develop nausea, vomiting, or sweating (diaphoresis).  You develop shortness of breath, or you faint.  Your vomit is green, yellow, black, or looks like coffee grounds or blood.  Your stool is red, bloody, or black. These symptoms could be signs of other problems, such as heart disease, gastric bleeding, or esophageal bleeding. MAKE SURE YOU:   Understand these instructions.  Will watch your condition.  Will get help right away if you are not doing well or get worse. Document Released: 11/01/2004 Document Revised: 04/16/2011 Document Reviewed: 08/11/2010 ExitCare Patient Information 2015 [employer]. This information is not intended to replace advice given to you by your health care provider. Make sure you discuss any questions you have with your health care provider.  

## 2014-09-29 ENCOUNTER — Encounter: Payer: Self-pay | Admitting: *Deleted

## 2014-09-30 ENCOUNTER — Telehealth: Payer: Self-pay | Admitting: Family Medicine

## 2014-09-30 NOTE — Telephone Encounter (Signed)
Spoke with Dr. Etter Sjogren. Her recommendation is for patient to take Protonix twice a day until she can get into see GI.  If symptoms persist or worsen, go to ER.  Pt notified and made aware.  She stated understanding and agreed with plan.  She was encourage to call back with further questions or concerns.

## 2014-09-30 NOTE — Telephone Encounter (Signed)
Pt was triaged by [facility].  Please see phone note on 09/30/14.

## 2014-09-30 NOTE — Telephone Encounter (Signed)
Pt says she's already taking Zantac and Protonix. She says that she was only been Protonix x 3 days, but she continues to have the chest discomfort.  At the moment, her symptoms have improved, but she says can't understand where symptoms are coming from.  She has been consuming a bland diet.  She has been avoiding spicy foods, fried foods, and soft drinks.  She says when she burps it sounds like a "chunky burp"--says it sounds like a man.  She says the bloating has decreased, abdominal pain has resolved.  She just keeps having intermittent chest pressure and she's just concerned that it's her heart.  She denied shortness of breath, nausea, pain down arms, abdominal/back/jaw pain.

## 2014-09-30 NOTE — Telephone Encounter (Signed)
[facility] - Client [facility] Patient Name: Vicki Le DOB: May 13, 1965 Initial Comment Caller states, has chest pains, fatigue, bloating feeling, dx with acid reflux on Monday , was given Rx, but chest pains are continuing Nurse Assessment Nurse: Mechele Dawley, RN, Amy Date/Time Eilene Ghazi Time): 09/30/2014 11:36:35 AM Confirm and document reason for call. If symptomatic, describe symptoms. ---CALLER STATES THAT SHE IS HAVING SOME CONTINUAL CHEST DISCOMFORT. SHE IS BURPING. SHE IS NOT AS BLOATED. SHE IS HAVING DISCOMFORT IN HER CHEST. SHE WAS DIAGNOSED ON MONDAY. SHE HAD CT SCAN AND ALL BLOOD WORK CAME BACK NORMAL. SHE STATES THAT SHE DOES NOT KNOW IF THIS IS NORMAL. SHE STATES THE PAIN IS STAYING THE SAME, UNCOMFORTABLE FEELING. SHE WAS ON OMPERAZOLE AND CHANGED TO [place]. THIS IS THE THIRD DAY OF TAKING THIS. WHEN SHE GOES TO WORK AND SITS DOWN THAT IS WHEN IT IS HURTING. SHE STATES THAT AT NIGHT SHE IS NOT HAVING THESE ISSUES. SHE STATES THAT WHEN SHE TAKES A DEEP BREATH SHE FEELS BETTER. SHE STATES SHE IS A SHALLOW BREATHER. Has the patient traveled out of the country within the last 30 days? ---Not Applicable Does the patient require triage? ---Yes Related visit to physician within the last 2 weeks? ---Yes Does the PT have any chronic conditions? (i.e. diabetes, asthma, etc.) ---Yes List chronic conditions. ---ACID REFLUX Did the patient indicate they were pregnant? ---No Guidelines Guideline Title Affirmed Question Affirmed Notes Chest Pain [1] Chest pain lasting <= 5 minutes AND [2] NO chest pain or cardiac symptoms now (Exceptions: pains lasting a few seconds) Final Disposition User See Physician within [place], Therapist, sports, Amy Comments CALLER IS INSTRUCTED TO CALL BACK IF [place]. SHE STATES THAT SYMPTOMS HAVE NOT REALLY CHANGED OR INCREASED SINCE HER VISIT, JUST STILL THERE. INSTRUCTED  HER WOULD SEND THE INFORMATION OVER TO THE OFFICE AND SEE WHAT THE MD WANTS HER TO DO. SHE STATES SHE HAD EKG, BLOOD WORK AND EVERYTHING WAS PLEASE NOTE: All timestamps contained within this report are represented as [place] Standard Time. CONFIDENTIALTY NOTICE: This fax transmission is intended only for the addressee. It contains information that is legally privileged, confidential or otherwise protected from use or disclosure. If you are not the intended recipient, you are strictly prohibited from reviewing, disclosing, copying using or disseminating any of this information or taking any action in reliance on or regarding this information. If you have received this fax in error, please notify us immediately by telephone so that we can arrange for its return to [place]. Phone: [phone], Toll-Free: [phone], Fax: [phone] Page: 2 of 2 Call Id: [id number] Comments NORMAL. HER CHEST WHEN SHE PRESSES ON IT IS VERY SORE. Referrals REFERRED TO PCP OFFICE Disagree/Comply: Comply

## 2014-09-30 NOTE — Telephone Encounter (Signed)
Please advise 

## 2014-09-30 NOTE — Telephone Encounter (Signed)
Pt still having "chest pains" that were attributed to acid reflux and she is still bloated. She is very concerned and wanting a call back since it isn't going away. Pt was seen 09/27/14. Per Genevieve Norlander. Transferred call to [facility].

## 2014-09-30 NOTE — Telephone Encounter (Signed)
She can take pepcid or zantac with protonix----  We are working on GI--- if she gets no relief with this she can go to ER ---- Delsa Sale is working on GI referral

## 2014-10-03 ENCOUNTER — Emergency Department (HOSPITAL_BASED_OUTPATIENT_CLINIC_OR_DEPARTMENT_OTHER)
Admission: EM | Admit: 2014-10-03 | Discharge: 2014-10-03 | Disposition: A | Discharge status: Home / Self Care | Payer: BLUE CROSS/BLUE SHIELD | Attending: Emergency Medicine | Admitting: Emergency Medicine

## 2014-10-03 ENCOUNTER — Encounter (HOSPITAL_BASED_OUTPATIENT_CLINIC_OR_DEPARTMENT_OTHER): Payer: Self-pay | Admitting: Emergency Medicine

## 2014-10-03 DIAGNOSIS — F329 Major depressive disorder, single episode, unspecified: Secondary | ICD-10-CM | POA: Insufficient documentation

## 2014-10-03 DIAGNOSIS — S61219A Laceration without foreign body of unspecified finger without damage to nail, initial encounter: Secondary | ICD-10-CM

## 2014-10-03 DIAGNOSIS — Z85820 Personal history of malignant melanoma of skin: Secondary | ICD-10-CM | POA: Insufficient documentation

## 2014-10-03 DIAGNOSIS — Z79899 Other long term (current) drug therapy: Secondary | ICD-10-CM | POA: Diagnosis not present

## 2014-10-03 DIAGNOSIS — Y998 Other external cause status: Secondary | ICD-10-CM | POA: Insufficient documentation

## 2014-10-03 DIAGNOSIS — K219 Gastro-esophageal reflux disease without esophagitis: Secondary | ICD-10-CM | POA: Insufficient documentation

## 2014-10-03 DIAGNOSIS — Z7952 Long term (current) use of systemic steroids: Secondary | ICD-10-CM | POA: Insufficient documentation

## 2014-10-03 DIAGNOSIS — W25XXXA Contact with sharp glass, initial encounter: Secondary | ICD-10-CM | POA: Diagnosis not present

## 2014-10-03 DIAGNOSIS — Z23 Encounter for immunization: Secondary | ICD-10-CM | POA: Insufficient documentation

## 2014-10-03 DIAGNOSIS — Z7951 Long term (current) use of inhaled steroids: Secondary | ICD-10-CM | POA: Diagnosis not present

## 2014-10-03 DIAGNOSIS — Y9389 Activity, other specified: Secondary | ICD-10-CM | POA: Diagnosis not present

## 2014-10-03 DIAGNOSIS — Z8619 Personal history of other infectious and parasitic diseases: Secondary | ICD-10-CM | POA: Diagnosis not present

## 2014-10-03 DIAGNOSIS — Y9289 Other specified places as the place of occurrence of the external cause: Secondary | ICD-10-CM | POA: Insufficient documentation

## 2014-10-03 DIAGNOSIS — S61212A Laceration without foreign body of right middle finger without damage to nail, initial encounter: Secondary | ICD-10-CM | POA: Diagnosis not present

## 2014-10-03 MED ORDER — TETANUS-DIPHTH-ACELL PERTUSSIS 5-2.5-18.5 LF-MCG/0.5 IM SUSP
0.5000 mL | Freq: Once | INTRAMUSCULAR | Status: AC
  Administered 2014-10-03: 0.5 mL via INTRAMUSCULAR
  Filled 2014-10-03: qty 0.5

## 2014-10-03 MED ORDER — LIDOCAINE HCL (PF) 1 % IJ SOLN
10.0000 mL | Freq: Once | INTRAMUSCULAR | Status: AC
  Administered 2014-10-03: 10 mL via INTRADERMAL
  Filled 2014-10-03: qty 10

## 2014-10-03 NOTE — Discharge Instructions (Signed)

## 2014-10-03 NOTE — ED Notes (Signed)
Applied bacitracin and nonstick dressing to right middle finger

## 2014-10-03 NOTE — ED Notes (Signed)
Pt verbalizes understanding of d/c instructions and denies any further needs at this time. 

## 2014-10-03 NOTE — ED Provider Notes (Signed)
CSN: [id number]     Arrival date & time 10/03/14  1926 History   First MD Initiated Contact with Patient 10/03/14 1934     Chief Complaint  Patient presents with  . Extremity Laceration     (Consider location/radiation/quality/duration/timing/severity/associated sxs/prior Treatment) HPI  Patient is a 49 y.o. female with no significant past medical history not on blood thinners who presents with a middle finger laceration.  She was emptying the dishwasher and grabbed a glass that was wedged between the pegs when it broke in her hand cutting her finger.  She is not in pain.    Past Medical History  Diagnosis Date  . Melanoma     left buttock  . Chicken pox   . Depression   . GERD (gastroesophageal reflux disease)    Past Surgical History  Procedure Laterality Date  . Skin cancer removal  August 14, 2011    2 lymph nodes removed as well  . Tonsilectomy/adenoidectomy with myringotomy    . Wrist surgery  1991/1192    ganglion cyst r wrist  . Melanoma excision  2013   Family History  Problem Relation Age of Onset  . Alzheimer's disease Father   . Colon cancer Father   . Breast cancer      on mother and father side  . Depression    . Hypertension     Social History  Substance Use Topics  . Smoking status: Never Smoker   . Smokeless tobacco: None  . Alcohol Use: Yes     Comment: occassionally   OB History    No data available     Review of Systems  All other systems reviewed and are negative.     Allergies  Vicodin  Home Medications   Prior to Admission medications   Medication Sig Start Date End Date Taking? Authorizing Provider  ALLERGY 10 MG tablet Take 10 mg by mouth every morning. 12/30/13   Historical Provider, MD  Ascorbic Acid (VITAMIN C) 1000 MG tablet Take 1,000 mg by mouth daily.    Historical Provider, MD  buPROPion (WELLBUTRIN XL) 300 MG 24 hr tablet TAKE 1 TABLET (300 MG TOTAL) BY MOUTH DAILY. 09/27/14   Rosalita Chessman, DO  cetirizine (ZYRTEC) 10  MG tablet Take 10 mg by mouth daily.    Historical Provider, MD  cholecalciferol (VITAMIN D) 1000 UNITS tablet Take 1,000 Units by mouth daily.    Historical Provider, MD  Cyanocobalamin (VITAMIN B-12 PO) Take 1 tablet by mouth daily.    Historical Provider, MD  fluticasone (FLONASE) 50 MCG/ACT nasal spray Place 2 sprays into both nostrils daily.    Historical Provider, MD  hydrocortisone (ANUSOL-HC) 2.5 % rectal cream Place 1 application rectally 2 (two) times daily. 01/08/14   Rosalita Chessman, DO  hydrocortisone-pramoxine (PROCTOFOAM HC) rectal foam Place 1 applicator rectally 2 (two) times daily. 01/08/14   Alferd Apa Lowne, DO  montelukast (SINGULAIR) 10 MG tablet Take 1 tablet (10 mg total) by mouth at bedtime. 07/06/14   Rosalita Chessman, DO  norethindrone-ethinyl estradiol (TRIPHASIL,CYCLAFEM,ALYACEN) 0.5/0.75/1-35 MG-MCG tablet Take 1 tablet by mouth daily.    Historical Provider, MD  pantoprazole (PROTONIX) 40 MG tablet Take 1 tablet (40 mg total) by mouth daily. 09/27/14   Rosalita Chessman, DO  ranitidine (ZANTAC) 150 MG capsule Take 150 mg by mouth 2 (two) times daily. 12/30/13   Historical Provider, MD  valACYclovir (VALTREX) 500 MG tablet Take 500 mg by mouth as needed.  Historical Provider, MD  zinc gluconate 50 MG tablet Take 50 mg by mouth daily.    Historical Provider, MD   BP 151/99 mmHg  Pulse 124  Temp(Src) 99.1 F (37.3 C) (Oral)  Resp 18  Ht 5\' 4"  (1.626 m)  Wt 197 lb (89.359 kg)  BMI 33.80 kg/m2  SpO2 99%  LMP 09/06/2014 (Approximate) Physical Exam  Constitutional: She is oriented to person, place, and time. She appears well-developed and well-nourished.  Eyes: Conjunctivae are normal. No scleral icterus.  Pulmonary/Chest: Effort normal. No respiratory distress.  Musculoskeletal:  She has FROM of MCP, DIPJ, and PIPJ.    Neurological: She is alert and oriented to person, place, and time.  Sensory intact.  Strength normal.  Skin:  Capillary refill less than 3 seconds.   There is a 2.5 cm laceration of the right middle finger.  No surrounding erythema, warmth, or signs of infection.  Laceration edges are well approximated.   Psychiatric: She has a normal mood and affect. Her behavior is normal.    ED Course  Procedures (including critical care time) LACERATION REPAIR Consent: Verbal consent obtained. Risks and benefits: risks, benefits and alternatives were discussed Consent given by: patient Patient identity confirmed: provided demographic data Prepped and Draped in normal sterile fashion Wound explored  Laceration Location: right middle finger  Laceration Length: 2.5cm  No Foreign Bodies seen or palpated  Anesthesia: local infiltration  Local anesthetic: lidocaine 1% without epinephrine  Anesthetic total: 5 ml  Irrigation method: syringe Amount of cleaning: standard  Skin closure: 4-0 prolene  Number of sutures: 3  Technique: simple interrupted  Patient tolerance: Patient tolerated the procedure well with no immediate complications.  Labs Review Labs Reviewed - No data to display  Imaging Review No results found. I have personally reviewed and evaluated these images and lab results as part of my medical decision-making.   EKG Interpretation None      MDM   Final diagnoses:  None    Patient with 2.5 cm laceration on right middle finger.  Wound cleaned and explored.  Laceration repair.  Pt to follow up with PCP in 10 days for suture removal.  Pt given return precautions.     Gloriann Loan, PA-C 10/03/14 2115  Pattricia Boss, MD 10/03/14 [phone]

## 2014-10-03 NOTE — ED Notes (Signed)
Patient reports injuring right middle finger after cutting it on a glass.

## 2014-10-07 ENCOUNTER — Encounter: Payer: Self-pay | Admitting: Gastroenterology

## 2014-10-07 ENCOUNTER — Telehealth: Payer: Self-pay | Admitting: Family Medicine

## 2014-10-07 DIAGNOSIS — R1012 Left upper quadrant pain: Secondary | ICD-10-CM

## 2014-10-07 NOTE — Telephone Encounter (Signed)
Pt would like to have a referral to GI. She says that she was under the impression that one was being done for her.    CB#: [phone]

## 2014-10-07 NOTE — Telephone Encounter (Signed)
The referral has not been placed, but Dr.Lowne's note says GI referral pending. I will place the referral now.      KP

## 2014-10-07 NOTE — Telephone Encounter (Signed)
Delsa Sale would you push this through as soon as possible since the patient has been waiting for over a week. Thanks    KP

## 2014-10-11 ENCOUNTER — Other Ambulatory Visit: Payer: Self-pay

## 2014-10-11 ENCOUNTER — Emergency Department (HOSPITAL_BASED_OUTPATIENT_CLINIC_OR_DEPARTMENT_OTHER): Payer: BLUE CROSS/BLUE SHIELD

## 2014-10-11 ENCOUNTER — Encounter (HOSPITAL_BASED_OUTPATIENT_CLINIC_OR_DEPARTMENT_OTHER): Payer: Self-pay

## 2014-10-11 ENCOUNTER — Observation Stay (HOSPITAL_BASED_OUTPATIENT_CLINIC_OR_DEPARTMENT_OTHER)
Admission: EM | Admit: 2014-10-11 | Discharge: 2014-10-12 | Disposition: A | Discharge status: Home / Self Care | Payer: BLUE CROSS/BLUE SHIELD | Attending: Internal Medicine | Admitting: Internal Medicine

## 2014-10-11 DIAGNOSIS — Z85820 Personal history of malignant melanoma of skin: Secondary | ICD-10-CM | POA: Insufficient documentation

## 2014-10-11 DIAGNOSIS — R200 Anesthesia of skin: Secondary | ICD-10-CM | POA: Diagnosis not present

## 2014-10-11 DIAGNOSIS — G459 Transient cerebral ischemic attack, unspecified: Secondary | ICD-10-CM

## 2014-10-11 DIAGNOSIS — R079 Chest pain, unspecified: Secondary | ICD-10-CM | POA: Insufficient documentation

## 2014-10-11 DIAGNOSIS — K219 Gastro-esophageal reflux disease without esophagitis: Secondary | ICD-10-CM | POA: Insufficient documentation

## 2014-10-11 LAB — RAPID URINE DRUG SCREEN, HOSP PERFORMED
Amphetamines: NOT DETECTED
BARBITURATES: NOT DETECTED
BENZODIAZEPINES: NOT DETECTED
COCAINE: NOT DETECTED
Opiates: NOT DETECTED
TETRAHYDROCANNABINOL: NOT DETECTED

## 2014-10-11 LAB — COMPREHENSIVE METABOLIC PANEL
ALT: 13 U/L — ABNORMAL LOW (ref 14–54)
AST: 18 U/L (ref 15–41)
Albumin: 3.9 g/dL (ref 3.5–5.0)
Alkaline Phosphatase: 41 U/L (ref 38–126)
Anion gap: 10 (ref 5–15)
BUN: 12 mg/dL (ref 6–20)
CHLORIDE: 104 mmol/L (ref 101–111)
CO2: 25 mmol/L (ref 22–32)
Calcium: 8.9 mg/dL (ref 8.9–10.3)
Creatinine, Ser: 0.64 mg/dL (ref 0.44–1.00)
Glucose, Bld: 100 mg/dL — ABNORMAL HIGH (ref 65–99)
POTASSIUM: 3.7 mmol/L (ref 3.5–5.1)
SODIUM: 139 mmol/L (ref 135–145)
Total Bilirubin: 0.5 mg/dL (ref 0.3–1.2)
Total Protein: 6.8 g/dL (ref 6.5–8.1)

## 2014-10-11 LAB — URINALYSIS, ROUTINE W REFLEX MICROSCOPIC
BILIRUBIN URINE: NEGATIVE
GLUCOSE, UA: NEGATIVE mg/dL
Hgb urine dipstick: NEGATIVE
Ketones, ur: NEGATIVE mg/dL
Leukocytes, UA: NEGATIVE
NITRITE: NEGATIVE
PH: 7.5 (ref 5.0–8.0)
Protein, ur: NEGATIVE mg/dL
SPECIFIC GRAVITY, URINE: 1.01 (ref 1.005–1.030)
Urobilinogen, UA: 0.2 mg/dL (ref 0.0–1.0)

## 2014-10-11 LAB — CBC
HEMATOCRIT: 42.5 % (ref 36.0–46.0)
Hemoglobin: 14 g/dL (ref 12.0–15.0)
MCH: 31.4 pg (ref 26.0–34.0)
MCHC: 32.9 g/dL (ref 30.0–36.0)
MCV: 95.3 fL (ref 78.0–100.0)
Platelets: 307 10*3/uL (ref 150–400)
RBC: 4.46 MIL/uL (ref 3.87–5.11)
RDW: 12.7 % (ref 11.5–15.5)
WBC: 10.2 10*3/uL (ref 4.0–10.5)

## 2014-10-11 LAB — TROPONIN I: Troponin I: <0.03 ng/mL (ref <0.031)

## 2014-10-11 MED ORDER — LORATADINE 10 MG PO TABS
10.0000 mg | ORAL_TABLET | Freq: Every morning | ORAL | Status: DC
  Administered 2014-10-12: 10 mg via ORAL
  Filled 2014-10-11: qty 1

## 2014-10-11 MED ORDER — FAMOTIDINE 20 MG PO TABS
20.0000 mg | ORAL_TABLET | Freq: Two times a day (BID) | ORAL | Status: DC
  Administered 2014-10-11 – 2014-10-12 (×2): 20 mg via ORAL
  Filled 2014-10-11 (×2): qty 1

## 2014-10-11 MED ORDER — ENOXAPARIN SODIUM 40 MG/0.4ML ~~LOC~~ SOLN
40.0000 mg | Freq: Every day | SUBCUTANEOUS | Status: DC
  Administered 2014-10-11: 40 mg via SUBCUTANEOUS
  Filled 2014-10-11: qty 0.4

## 2014-10-11 MED ORDER — ASPIRIN 81 MG PO CHEW
324.0000 mg | CHEWABLE_TABLET | Freq: Once | ORAL | Status: AC
  Administered 2014-10-11: 324 mg via ORAL
  Filled 2014-10-11: qty 4

## 2014-10-11 MED ORDER — PANTOPRAZOLE SODIUM 40 MG PO TBEC
40.0000 mg | DELAYED_RELEASE_TABLET | Freq: Every day | ORAL | Status: DC
  Administered 2014-10-12: 40 mg via ORAL
  Filled 2014-10-11: qty 1

## 2014-10-11 MED ORDER — NORETHIN-ETH ESTRAD TRIPHASIC 0.5/0.75/1-35 MG-MCG PO TABS: 1.0000 | ORAL_TABLET | Freq: Every day | ORAL | Status: DC

## 2014-10-11 MED ORDER — FLUTICASONE PROPIONATE 50 MCG/ACT NA SUSP
2.0000 | Freq: Every day | NASAL | Status: DC
  Administered 2014-10-12: 2 via NASAL
  Filled 2014-10-11: qty 16

## 2014-10-11 MED ORDER — MONTELUKAST SODIUM 10 MG PO TABS
10.0000 mg | ORAL_TABLET | Freq: Every day | ORAL | Status: DC
Start: 2014-10-11 — End: 2014-10-12
  Administered 2014-10-12: 10 mg via ORAL
  Filled 2014-10-11 (×2): qty 1

## 2014-10-11 MED ORDER — BUPROPION HCL ER (XL) 150 MG PO TB24
300.0000 mg | ORAL_TABLET | Freq: Every day | ORAL | Status: DC
Start: 2014-10-12 — End: 2014-10-12
  Administered 2014-10-12: 300 mg via ORAL
  Filled 2014-10-11: qty 2

## 2014-10-11 NOTE — ED Notes (Signed)
Report given to [place]

## 2014-10-11 NOTE — ED Provider Notes (Signed)
CSN: [id number]     Arrival date & time 10/11/14  1441 History   This chart was scribed for Ezequiel Essex, MD by Hilda Lias, ED Scribe. This patient was seen in room MH08/MH08 and the patient's care was started at 4:09 PM.  Chief Complaint  Patient presents with  . Chest Pain      The history is provided by the patient. No language interpreter was used.   HPI Comments: Vicki Le is a 49 y.o. female who presents to the Emergency Department complaining of intermittent left-sided and central chest pain with associated numbness in her left arm, left leg, and left side of her face and associated intermittent SOB, diaphoresis that has been present for 2 weeks. Pt states that she saw her PCP two weeks ago and was diagnosed with GERD, and also received an EKG and CT of her abdomen, which were normal. Pt states that she had left-sided face and left arm numbness yesterday in association with her chest pain, and notes that this morning she had similar symptoms, with numbness on the left side of her face, and in her left arm and left leg. Pt states her numbness lasted for 30 minutes or so this morning, but notes she did not have any facial droop and that her speech was normal. Pt states her gait was normal and that she chould use her arms and legs while she had numbness. Pt denies trouble talking or trouble swallowing.     Past Medical History  Diagnosis Date  . Melanoma     left buttock  . Chicken pox   . Depression   . GERD (gastroesophageal reflux disease)    Past Surgical History  Procedure Laterality Date  . Skin cancer removal  August 14, 2011    2 lymph nodes removed as well  . Tonsilectomy/adenoidectomy with myringotomy    . Wrist surgery  1991/1192    ganglion cyst r wrist  . Melanoma excision  2013   Family History  Problem Relation Age of Onset  . Alzheimer's disease Father   . Colon cancer Father   . Breast cancer      on mother and father side  . Depression    .  Hypertension     Social History  Substance Use Topics  . Smoking status: Never Smoker   . Smokeless tobacco: None  . Alcohol Use: Yes     Comment: occassionally   OB History    No data available     Review of Systems  A complete 10 system review of systems was obtained and all systems are negative except as noted in the HPI and PMH.    Allergies  Vicodin  Home Medications   Prior to Admission medications   Medication Sig Start Date End Date Taking? Authorizing Provider  ALLERGY 10 MG tablet Take 10 mg by mouth every morning. 12/30/13   Historical Provider, MD  Ascorbic Acid (VITAMIN C) 1000 MG tablet Take 1,000 mg by mouth daily.    Historical Provider, MD  buPROPion (WELLBUTRIN XL) 300 MG 24 hr tablet TAKE 1 TABLET (300 MG TOTAL) BY MOUTH DAILY. 09/27/14   Rosalita Chessman, DO  cetirizine (ZYRTEC) 10 MG tablet Take 10 mg by mouth daily.    Historical Provider, MD  cholecalciferol (VITAMIN D) 1000 UNITS tablet Take 1,000 Units by mouth daily.    Historical Provider, MD  Cyanocobalamin (VITAMIN B-12 PO) Take 1 tablet by mouth daily.    Historical Provider, MD  fluticasone (FLONASE) 50 MCG/ACT nasal spray Place 2 sprays into both nostrils daily.    Historical Provider, MD  hydrocortisone (ANUSOL-HC) 2.5 % rectal cream Place 1 application rectally 2 (two) times daily. 01/08/14   Rosalita Chessman, DO  hydrocortisone-pramoxine (PROCTOFOAM HC) rectal foam Place 1 applicator rectally 2 (two) times daily. 01/08/14   Alferd Apa Lowne, DO  montelukast (SINGULAIR) 10 MG tablet Take 1 tablet (10 mg total) by mouth at bedtime. 07/06/14   Rosalita Chessman, DO  norethindrone-ethinyl estradiol (TRIPHASIL,CYCLAFEM,ALYACEN) 0.5/0.75/1-35 MG-MCG tablet Take 1 tablet by mouth daily.    Historical Provider, MD  pantoprazole (PROTONIX) 40 MG tablet Take 1 tablet (40 mg total) by mouth daily. 09/27/14   Rosalita Chessman, DO  ranitidine (ZANTAC) 150 MG capsule Take 150 mg by mouth 2 (two) times daily. 12/30/13    Historical Provider, MD  valACYclovir (VALTREX) 500 MG tablet Take 500 mg by mouth as needed.    Historical Provider, MD  zinc gluconate 50 MG tablet Take 50 mg by mouth daily.    Historical Provider, MD   BP 129/89 mmHg  Pulse 100  Temp(Src) 98 F (36.7 C) (Oral)  Resp 18  Ht 5\' 4"  (1.626 m)  Wt 195 lb (88.451 kg)  BMI 33.46 kg/m2  SpO2 97%  LMP 10/08/2014 Physical Exam  Constitutional: She is oriented to person, place, and time. She appears well-developed and well-nourished. No distress.  HENT:  Head: Normocephalic and atraumatic.  Mouth/Throat: Oropharynx is clear and moist. No oropharyngeal exudate.  Eyes: Conjunctivae and EOM are normal. Pupils are equal, round, and reactive to light.  Neck: Normal range of motion. Neck supple.  No meningismus.  Cardiovascular: Regular rhythm, normal heart sounds and intact distal pulses.   No murmur heard. Mild tachycardia   Pulmonary/Chest: Effort normal and breath sounds normal. No respiratory distress.  Abdominal: Soft. There is no tenderness. There is no rebound and no guarding.  Musculoskeletal: Normal range of motion. She exhibits no edema or tenderness.  Neurological: She is alert and oriented to person, place, and time. No cranial nerve deficit. She exhibits normal muscle tone. Coordination normal.  No ataxia on finger to nose bilaterally. No pronator drift. 5/5 strength throughout. CN 2-12 intact. Negative Romberg. Equal grip strength. Sensation intact. Gait is normal.   Skin: Skin is warm.  Psychiatric: She has a normal mood and affect. Her behavior is normal.  Nursing note and vitals reviewed.   ED Course  Procedures (including critical care time)  DIAGNOSTIC STUDIES: Oxygen Saturation is 100% on room air, normal by my interpretation.    COORDINATION OF CARE: 4:15 PM Discussed treatment plan with pt at bedside and pt agreed to plan.   Labs Review Labs Reviewed  COMPREHENSIVE METABOLIC PANEL - Abnormal; Notable for the  following:    Glucose, Bld 100 (*)    ALT 13 (*)    All other components within normal limits  TROPONIN I  CBC  URINALYSIS, ROUTINE W REFLEX MICROSCOPIC (NOT AT [facility])  URINE RAPID DRUG SCREEN, HOSP PERFORMED    Imaging Review Dg Chest 2 View  10/11/2014   CLINICAL DATA:  Left chest pain for 3 weeks question acid reflux. Left-sided body numbness for 1-2 days.  EXAM: CHEST  2 VIEW  COMPARISON:  None.  FINDINGS: The heart size and mediastinal contours are within normal limits. Both lungs are clear. The visualized skeletal structures are unremarkable.  IMPRESSION: No active cardiopulmonary disease.   Electronically Signed   By: Benjamine Mola  Owens Shark M.D.   On: 10/11/2014 16:35   Ct Head Wo Contrast  10/11/2014   CLINICAL DATA:  LEFT-sided numbness ingested a.  Chest pain  EXAM: CT HEAD WITHOUT CONTRAST  TECHNIQUE: Contiguous axial images were obtained from the base of the skull through the vertex without intravenous contrast.  COMPARISON:  None.  FINDINGS: No acute intracranial hemorrhage. No focal mass lesion. No CT evidence of acute infarction. No midline shift or mass effect. No hydrocephalus. Basilar cisterns are patent. Paranasal sinuses and mastoid air cells are clear.  IMPRESSION: Normal head CTA   Electronically Signed   By: Suzy Bouchard M.D.   On: 10/11/2014 17:05   I have personally reviewed and evaluated these images and lab results as part of my medical decision-making.   EKG Interpretation   Date/Time:  Monday October 11 2014 14:47:48 EDT Ventricular Rate:  110 PR Interval:  132 QRS Duration: 82 QT Interval:  332 QTC Calculation: 449 R Axis:   -7 Text Interpretation:  Sinus tachycardia Possible Anterior infarct , age  undetermined Abnormal ECG No previous ECGs available Confirmed by [place]   MD, Kanav Kazmierczak [phone]) on 10/11/2014 3:49:09 PM      MDM   Final diagnoses:  Transient cerebral ischemia, unspecified transient cerebral ischemia type   Patient reports intermittent  chest pain coming and going for several weeks. Saw PCP and worked up for acid reflux with CT scan and Protonix. Today she became concerned because she had heaviness of the left side of her body lasted about 30 minutes. Lasted for several minutes yesterday as well. Back to baseline now. No weakness.  Chest pain is atypical for ACS. EKG shows sinus tachycardia. CT head is negative.  Concern that her left-sided weakness may represent TIA though she really has no risk factors for stroke. 30 minutes of left-sided numbness today now resolved as well as 5 minutes of left-sided numbness yesterday. MRI not available.  Would benefit from TIA workup. Discussed with Dr. Humphrey Rolls.    I personally performed the services described in this documentation, which was scribed in my presence. The recorded information has been reviewed and is accurate.   Ezequiel Essex, MD 10/11/14 [phone]

## 2014-10-11 NOTE — ED Notes (Signed)
Attempt to call report nurse not available 

## 2014-10-11 NOTE — ED Notes (Signed)
Pt c/o CP "for weeks"-was seen by PCP 2 weeks ago-dx with GERD, EKG, CT abd-rx protonix-pt denies CP at present

## 2014-10-11 NOTE — ED Notes (Signed)
MD at bedside. 

## 2014-10-11 NOTE — H&P (Signed)
[facility] History and Physical  Vicki Le JXB:[id number] DOB: 1965-07-03 DOA: 10/11/2014  Referring physician: EDP PCP: Garnet Koyanagi, DO   Chief Complaint: Chest pain   HPI: Vicki Le is a 49 y.o. female who presents to the Emergency Department complaining of intermittent left-sided and central chest pain with associated numbness in her left arm, left leg, and left side of her face and associated intermittent SOB, diaphoresis that has been present for at least 2 weeks or more. Pt states that she saw her PCP two weeks ago and was diagnosed with GERD, and also received an EKG and CT of her abdomen, which were normal. Pt states that she had left-sided face and left arm numbness yesterday in association with her chest pain, and notes that this morning she had similar symptoms, with numbness on the left side of her face, and in her left arm and left leg. Pt states her numbness lasted for 30 minutes or so this morning, but notes she did not have any facial droop and that her speech was normal. Pt states her gait was normal and that she chould use her arms and legs while she had numbness. Pt denies trouble talking or trouble swallowing.  Regarding risk factors: patient has no h/o HTN, DM, HLD, smoking, family history of early onset CAD or stroke.   Review of Systems: Systems reviewed.  As above, otherwise negative  Past Medical History  Diagnosis Date  . Melanoma     left buttock  . Chicken pox   . Depression   . GERD (gastroesophageal reflux disease)    Past Surgical History  Procedure Laterality Date  . Skin cancer removal  August 14, 2011    2 lymph nodes removed as well  . Tonsilectomy/adenoidectomy with myringotomy    . Wrist surgery  1991/1192    ganglion cyst r wrist  . Melanoma excision  2013   Social History:  reports that she has never smoked. She does not have any smokeless tobacco history on file. She reports that she drinks alcohol. She reports that she does not  use illicit drugs.  Allergies  Allergen Reactions  . Vicodin [Hydrocodone-Acetaminophen] Nausea And Vomiting    Family History  Problem Relation Age of Onset  . Alzheimer's disease Father   . Colon cancer Father   . Breast cancer      on mother and father side  . Depression    . Hypertension       Prior to Admission medications   Medication Sig Start Date End Date Taking? Authorizing Provider  ALLERGY 10 MG tablet Take 10 mg by mouth every morning. 12/30/13   Historical Provider, MD  Ascorbic Acid (VITAMIN C) 1000 MG tablet Take 1,000 mg by mouth daily.    Historical Provider, MD  buPROPion (WELLBUTRIN XL) 300 MG 24 hr tablet TAKE 1 TABLET (300 MG TOTAL) BY MOUTH DAILY. 09/27/14   Rosalita Chessman, DO  cetirizine (ZYRTEC) 10 MG tablet Take 10 mg by mouth daily.    Historical Provider, MD  cholecalciferol (VITAMIN D) 1000 UNITS tablet Take 1,000 Units by mouth daily.    Historical Provider, MD  Cyanocobalamin (VITAMIN B-12 PO) Take 1 tablet by mouth daily.    Historical Provider, MD  fluticasone (FLONASE) 50 MCG/ACT nasal spray Place 2 sprays into both nostrils daily.    Historical Provider, MD  hydrocortisone (ANUSOL-HC) 2.5 % rectal cream Place 1 application rectally 2 (two) times daily. 01/08/14   Rosalita Chessman, DO  hydrocortisone-pramoxine (PROCTOFOAM HC) rectal foam Place 1 applicator rectally 2 (two) times daily. 01/08/14   Alferd Apa Lowne, DO  montelukast (SINGULAIR) 10 MG tablet Take 1 tablet (10 mg total) by mouth at bedtime. 07/06/14   Rosalita Chessman, DO  norethindrone-ethinyl estradiol (TRIPHASIL,CYCLAFEM,ALYACEN) 0.5/0.75/1-35 MG-MCG tablet Take 1 tablet by mouth daily.    Historical Provider, MD  pantoprazole (PROTONIX) 40 MG tablet Take 1 tablet (40 mg total) by mouth daily. 09/27/14   Rosalita Chessman, DO  ranitidine (ZANTAC) 150 MG capsule Take 150 mg by mouth 2 (two) times daily. 12/30/13   Historical Provider, MD  valACYclovir (VALTREX) 500 MG tablet Take 500 mg by mouth as  needed.    Historical Provider, MD  zinc gluconate 50 MG tablet Take 50 mg by mouth daily.    Historical Provider, MD   Physical Exam: Filed Vitals:   10/11/14 1915  BP: 129/89  Pulse: 100  Temp:   Resp:     BP 129/89 mmHg  Pulse 100  Temp(Src) 98 F (36.7 C) (Oral)  Resp 18  Ht 5\' 4"  (1.626 m)  Wt 88.451 kg (195 lb)  BMI 33.46 kg/m2  SpO2 97%  LMP 10/08/2014  General Appearance:    Alert, oriented, no distress, appears stated age  Head:    Normocephalic, atraumatic  Eyes:    PERRL, EOMI, sclera non-icteric        Nose:   Nares without drainage or epistaxis. Mucosa, turbinates normal  Throat:   Moist mucous membranes. Oropharynx without erythema or exudate.  Neck:   Supple. No carotid bruits.  No thyromegaly.  No lymphadenopathy.   Back:     No CVA tenderness, no spinal tenderness  Lungs:     Clear to auscultation bilaterally, without wheezes, rhonchi or rales  Chest wall:    No tenderness to palpitation  Heart:    Regular rate and rhythm without murmurs, gallops, rubs  Abdomen:     Soft, non-tender, nondistended, normal bowel sounds, no organomegaly  Genitalia:    deferred  Rectal:    deferred  Extremities:   No clubbing, cyanosis or edema.  Pulses:   2+ and symmetric all extremities  Skin:   Skin color, texture, turgor normal, no rashes or lesions  Lymph nodes:   Cervical, supraclavicular, and axillary nodes normal  Neurologic:   CNII-XII intact. Normal strength, sensation and reflexes      throughout    Labs on Admission:  Basic Metabolic Panel:  Recent Labs Lab 10/11/14 1640  NA 139  K 3.7  CL 104  CO2 25  GLUCOSE 100*  BUN 12  CREATININE 0.64  CALCIUM 8.9   Liver Function Tests:  Recent Labs Lab 10/11/14 1640  AST 18  ALT 13*  ALKPHOS 41  BILITOT 0.5  PROT 6.8  ALBUMIN 3.9   No results for input(s): LIPASE, AMYLASE in the last 168 hours. No results for input(s): AMMONIA in the last 168 hours. CBC:  Recent Labs Lab 10/11/14 1559  WBC  10.2  HGB 14.0  HCT 42.5  MCV 95.3  PLT 307   Cardiac Enzymes:  Recent Labs Lab 10/11/14 1559  TROPONINI <0.03    BNP (last 3 results) No results for input(s): PROBNP in the last 8760 hours. CBG: No results for input(s): GLUCAP in the last 168 hours.  Radiological Exams on Admission: Dg Chest 2 View  10/11/2014   CLINICAL DATA:  Left chest pain for 3 weeks question acid reflux. Left-sided body numbness for  1-2 days.  EXAM: CHEST  2 VIEW  COMPARISON:  None.  FINDINGS: The heart size and mediastinal contours are within normal limits. Both lungs are clear. The visualized skeletal structures are unremarkable.  IMPRESSION: No active cardiopulmonary disease.   Electronically Signed   By: Nolon Nations M.D.   On: 10/11/2014 16:35   Ct Head Wo Contrast  10/11/2014   CLINICAL DATA:  LEFT-sided numbness ingested a.  Chest pain  EXAM: CT HEAD WITHOUT CONTRAST  TECHNIQUE: Contiguous axial images were obtained from the base of the skull through the vertex without intravenous contrast.  COMPARISON:  None.  FINDINGS: No acute intracranial hemorrhage. No focal mass lesion. No CT evidence of acute infarction. No midline shift or mass effect. No hydrocephalus. Basilar cisterns are patent. Paranasal sinuses and mastoid air cells are clear.  IMPRESSION: Normal head CTA   Electronically Signed   By: Suzy Bouchard M.D.   On: 10/11/2014 17:05    EKG: Independently reviewed.  Assessment/Plan Principal Problem:   Left sided numbness   1. Left sided numbness - 1. No real risk factors for TIA other than being on hormonal BC pills. 2. Will go ahead and get MRI brain due to a history of malignant melanoma of the skin 3 years ago (though this was very low stage, lymphnode negative, etc). 3. If MRI brain is negative, consider discharge, not sure that she needs the full TIA work up done inpatient if MRI negative. 2. Chest pain - Patient is really absent risk factors for CAD, her heart score is a 1 at this  point (1 point for age).  PCP may or may not wish to consider stress test as outpatient but ACS likely hood is low at this point when HPI and PMH are considered.    Code Status: Full Code  Family Communication: Husband at bedside Disposition Plan: Admit to obs   Time spent: 30 min  GARDNER, JARED M. [facility] Pager [phone]  If 7AM-7PM, please contact the day team taking care of the patient [email or web address] Password [email or web address] 10/11/2014, 9:59 PM

## 2014-10-12 ENCOUNTER — Other Ambulatory Visit: Payer: Self-pay

## 2014-10-12 ENCOUNTER — Observation Stay (HOSPITAL_BASED_OUTPATIENT_CLINIC_OR_DEPARTMENT_OTHER): Payer: BLUE CROSS/BLUE SHIELD

## 2014-10-12 ENCOUNTER — Observation Stay (HOSPITAL_COMMUNITY): Payer: BLUE CROSS/BLUE SHIELD

## 2014-10-12 ENCOUNTER — Telehealth: Payer: Self-pay | Admitting: Family Medicine

## 2014-10-12 DIAGNOSIS — G459 Transient cerebral ischemic attack, unspecified: Secondary | ICD-10-CM

## 2014-10-12 DIAGNOSIS — R200 Anesthesia of skin: Secondary | ICD-10-CM

## 2014-10-12 DIAGNOSIS — R0789 Other chest pain: Secondary | ICD-10-CM

## 2014-10-12 LAB — LIPID PANEL
CHOL/HDL RATIO: 3.2 ratio
CHOLESTEROL: 185 mg/dL (ref 0–200)
HDL: 58 mg/dL (ref >40)
LDL Cholesterol: 76 mg/dL (ref 0–99)
TRIGLYCERIDES: 255 mg/dL — AB (ref <150)
VLDL: 51 mg/dL — AB (ref 0–40)

## 2014-10-12 LAB — TROPONIN I: Troponin I: <0.03 ng/mL (ref <0.031)

## 2014-10-12 MED ORDER — PANTOPRAZOLE SODIUM 40 MG PO TBEC: 40.0000 mg | DELAYED_RELEASE_TABLET | Freq: Two times a day (BID) | ORAL | Status: DC

## 2014-10-12 NOTE — Discharge Summary (Signed)
Physician Discharge Summary  Vicki Le BWI:[id number] DOB: 20-May-1965 DOA: 10/11/2014  PCP: Garnet Koyanagi, DO  Admit date: 10/11/2014 Discharge date: 10/12/2014  Time spent: 35 minutes  Recommendations for Outpatient Follow-up:  1. HgbA1C 2. Hold on ASA for now- numbness sounds like anxiety and recent GERD diagnosis- consider after GI evaluation  Discharge Diagnoses:  Principal Problem:   Left sided numbness   Discharge Condition: improved  Diet recommendation: cardiac  Filed Weights   10/11/14 1449  Weight: 88.451 kg (195 lb)    History of present illness:  Vicki Le is a 49 y.o. female who presents to the Emergency Department complaining of intermittent left-sided and central chest pain with associated numbness in her left arm, left leg, and left side of her face and associated intermittent SOB, diaphoresis that has been present for at least 2 weeks or more. Pt states that she saw her PCP two weeks ago and was diagnosed with GERD, and also received an EKG and CT of her abdomen, which were normal. Pt states that she had left-sided face and left arm numbness yesterday in association with her chest pain, and notes that this morning she had similar symptoms, with numbness on the left side of her face, and in her left arm and left leg. Pt states her numbness lasted for 30 minutes or so this morning, but notes she did not have any facial droop and that her speech was normal. Pt states her gait was normal and that she chould use her arms and legs while she had numbness. Pt denies trouble talking or trouble swallowing.  Regarding risk factors: patient has no h/o HTN, DM, HLD, smoking, family history of early onset CAD or stroke.  Hospital Course:   Atypical chest pain-recent diagnosis of GERD. Sharp quality chest pain. In fact, this has improved since addition of Protonix. Outpatient stress test  Left-sided body numbness-MRI negative, echo, carotid ok, FLP ok, HgBA1C  pending  Procedures:    Consultations:  cards  Discharge Exam: Filed Vitals:   10/12/14 1505  BP: 123/85  Pulse: 95  Temp: 98.2 F (36.8 C)  Resp: 18    General: A+Ox3, NAD  Discharge Instructions   Discharge Instructions    Diet - low sodium heart healthy    Complete by:  As directed      Increase activity slowly    Complete by:  As directed           Current Discharge Medication List    CONTINUE these medications which have NOT CHANGED   Details  ALLERGY 10 MG tablet Take 10 mg by mouth every morning. Refills: 3    Ascorbic Acid (VITAMIN C) 1000 MG tablet Take 1,000 mg by mouth daily.    buPROPion (WELLBUTRIN XL) 300 MG 24 hr tablet TAKE 1 TABLET (300 MG TOTAL) BY MOUTH DAILY. Qty: 90 tablet, Refills: 1    cetirizine (ZYRTEC) 10 MG tablet Take 10 mg by mouth daily.    cholecalciferol (VITAMIN D) 1000 UNITS tablet Take 1,000 Units by mouth daily.    Cyanocobalamin (VITAMIN B-12 PO) Take 1 tablet by mouth daily.    fluticasone (FLONASE) 50 MCG/ACT nasal spray Place 2 sprays into both nostrils daily.    montelukast (SINGULAIR) 10 MG tablet Take 1 tablet (10 mg total) by mouth at bedtime. Qty: 90 tablet, Refills: 3    norethindrone-ethinyl estradiol 1/35 (ORTHO-NOVUM, NORTREL,CYCLAFEM) tablet Take 1 tablet by mouth daily.    pantoprazole (PROTONIX) 40 MG tablet Take 1  tablet (40 mg total) by mouth daily. Qty: 30 tablet, Refills: 3    ranitidine (ZANTAC) 150 MG capsule Take 150 mg by mouth 2 (two) times daily. Refills: 1    zinc gluconate 50 MG tablet Take 50 mg by mouth daily.      STOP taking these medications     norethindrone-ethinyl estradiol (TRIPHASIL,CYCLAFEM,ALYACEN) 0.5/0.75/1-35 MG-MCG tablet        Allergies  Allergen Reactions  . Vicodin [Hydrocodone-Acetaminophen] Nausea And Vomiting   Follow-up Information    Follow up with Garnet Koyanagi, DO In 1 week.   Specialty:  Family Medicine   Contact information:   [address] [place] [address] [phone]        The results of significant diagnostics from this hospitalization (including imaging, microbiology, ancillary and laboratory) are listed below for reference.    Significant Diagnostic Studies: Dg Chest 2 View  10/11/2014   CLINICAL DATA:  Left chest pain for 3 weeks question acid reflux. Left-sided body numbness for 1-2 days.  EXAM: CHEST  2 VIEW  COMPARISON:  None.  FINDINGS: The heart size and mediastinal contours are within normal limits. Both lungs are clear. The visualized skeletal structures are unremarkable.  IMPRESSION: No active cardiopulmonary disease.   Electronically Signed   By: Nolon Nations M.D.   On: 10/11/2014 16:35   Ct Head Wo Contrast  10/11/2014   CLINICAL DATA:  LEFT-sided numbness ingested a.  Chest pain  EXAM: CT HEAD WITHOUT CONTRAST  TECHNIQUE: Contiguous axial images were obtained from the base of the skull through the vertex without intravenous contrast.  COMPARISON:  None.  FINDINGS: No acute intracranial hemorrhage. No focal mass lesion. No CT evidence of acute infarction. No midline shift or mass effect. No hydrocephalus. Basilar cisterns are patent. Paranasal sinuses and mastoid air cells are clear.  IMPRESSION: Normal head CTA   Electronically Signed   By: Suzy Bouchard M.D.   On: 10/11/2014 17:05   Mr Brain Wo Contrast  10/12/2014   CLINICAL DATA:  Initial evaluation for intermittent left-sided numbness.  EXAM: MRI HEAD WITHOUT CONTRAST  TECHNIQUE: Multiplanar, multiecho pulse sequences of the brain and surrounding structures were obtained without intravenous contrast.  COMPARISON:  Prior CT from 10/11/2014.  FINDINGS: The CSF containing spaces are within normal limits for patient age. No focal parenchymal signal abnormality is identified. No mass lesion, midline shift, or extra-axial fluid collection. Ventricles are normal in size without evidence of hydrocephalus.  No diffusion-weighted signal abnormality is  identified to suggest acute intracranial infarct. Gray-white matter differentiation is maintained. Normal flow voids are seen within the intracranial vasculature. No intracranial hemorrhage identified.  The cervicomedullary junction is normal. Pituitary gland is within normal limits. Pituitary stalk is midline. The globes and optic nerves demonstrate a normal appearance with normal signal intensity.  The bone marrow signal intensity is normal. Calvarium is intact. Visualized upper cervical spine is within normal limits.  Scalp soft tissues are unremarkable.  Paranasal sinuses are clear.  No mastoid effusion.  IMPRESSION: Normal MRI of the brain.   Electronically Signed   By: Jeannine Boga M.D.   On: 10/12/2014 03:07   Ct Abdomen Pelvis W Contrast  09/27/2014   CLINICAL DATA:  Left lower quadrant abdominal pain.  EXAM: CT ABDOMEN AND PELVIS WITH CONTRAST  TECHNIQUE: Multidetector CT imaging of the abdomen and pelvis was performed using the standard protocol following bolus administration of intravenous contrast.  CONTRAST:  129mL OMNIPAQUE IOHEXOL 300  MG/ML  SOLN  COMPARISON:  None.  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 Visualized lung bases appear normal.  No gallstones are noted. The liver, spleen and pancreas appear normal. Adrenal glands and kidneys appear normal. No hydronephrosis or renal obstruction is noted. The appendix appears normal. There is no evidence of bowel obstruction. No abnormal fluid collection is noted. Uterus and ovaries appear normal. Urinary bladder appears normal. No significant adenopathy is noted.  IMPRESSION: No acute abnormality seen in the abdomen or pelvis.   Electronically Signed   By: Marijo Conception, M.D.   On: 09/27/2014 15:48    Microbiology: No results found for this or any previous visit (from the past 240 hour(s)).   Labs: Basic Metabolic Panel:  Recent Labs Lab 10/11/14 1640  NA 139  K 3.7  CL 104  CO2 25  GLUCOSE 100*  BUN 12   CREATININE 0.64  CALCIUM 8.9   Liver Function Tests:  Recent Labs Lab 10/11/14 1640  AST 18  ALT 13*  ALKPHOS 41  BILITOT 0.5  PROT 6.8  ALBUMIN 3.9   No results for input(s): LIPASE, AMYLASE in the last 168 hours. No results for input(s): AMMONIA in the last 168 hours. CBC:  Recent Labs Lab 10/11/14 1559  WBC 10.2  HGB 14.0  HCT 42.5  MCV 95.3  PLT 307   Cardiac Enzymes:  Recent Labs Lab 10/11/14 1559 10/12/14 1106  TROPONINI <0.03 <0.03   BNP: BNP (last 3 results) No results for input(s): BNP in the last 8760 hours.  ProBNP (last 3 results) No results for input(s): PROBNP in the last 8760 hours.  CBG: No results for input(s): GLUCAP in the last 168 hours.     SignedEulogio Bear  [facility] 10/12/2014, 3:29 PM

## 2014-10-12 NOTE — Discharge Instructions (Signed)
STROKE/TIA DISCHARGE INSTRUCTIONS SMOKING Cigarette smoking nearly doubles your risk of having a stroke & is the single most alterable risk factor  If you smoke or have smoked in the last 12 months, you are advised to quit smoking for your health.  Most of the excess cardiovascular risk related to smoking disappears within a year of stopping.  Ask you doctor about anti-smoking medications  [place] Quit Line: 1-800-QUIT NOW  Free Smoking Cessation Classes [phone]  CHOLESTEROL Know your levels; limit fat & cholesterol in your diet  Lipid Panel     Component Value Date/Time   CHOL 185 10/12/2014 1106   TRIG 255* 10/12/2014 1106   HDL 58 10/12/2014 1106   CHOLHDL 3.2 10/12/2014 1106   VLDL 51* 10/12/2014 1106   LDLCALC 76 10/12/2014 1106      Many patients benefit from treatment even if their cholesterol is at goal.  Goal: Total Cholesterol (CHOL) less than 160  Goal:  Triglycerides (TRIG) less than 150  Goal:  HDL greater than 40  Goal:  LDL (LDLCALC) less than 100   BLOOD PRESSURE American Stroke Association blood pressure target is less that 120/80 mm/Hg  Your discharge blood pressure is:  BP: 123/85 mmHg  Monitor your blood pressure  Limit your salt and alcohol intake  Many individuals will require more than one medication for high blood pressure  DIABETES (A1c is a blood sugar average for last 3 months) Goal HGBA1c is under 7% (HBGA1c is blood sugar average for last 3 months)  Diabetes: No known diagnosis of diabetes    No results found for: HGBA1C   Your HGBA1c can be lowered with medications, healthy diet, and exercise.  Check your blood sugar as directed by your physician  Call your physician if you experience unexplained or low blood sugars.  PHYSICAL ACTIVITY/REHABILITATION Goal is 30 minutes at least 4 days per week  Activity: Increase activity slowly, Therapies: Physical Therapy: NONE Return to work: As tolarated  Activity decreases your risk of heart  attack and stroke and makes your heart stronger.  It helps control your weight and blood pressure; helps you relax and can improve your mood.  Participate in a regular exercise program.  Talk with your doctor about the best form of exercise for you (dancing, walking, swimming, cycling).  DIET/WEIGHT Goal is to maintain a healthy weight  Your discharge diet is: Diet Heart Room service appropriate?: Yes; Fluid consistency:: Thin Diet - low sodium heart healthy thin liquids Your height is:  Height: 5\' 4"  (162.6 cm) Your current weight is: Weight: 88.451 kg (195 lb) Your Body Mass Index (BMI) is:  BMI (Calculated): 33.5  Following the type of diet specifically designed for you will help prevent another stroke.  Your goal weight range is:  108-132  Your goal Body Mass Index (BMI) is 19-24.  Healthy food habits can help reduce 3 risk factors for stroke:  High cholesterol, hypertension, and excess weight.  RESOURCES Stroke/Support Group:  Call [phone]   STROKE EDUCATION PROVIDED/REVIEWED AND GIVEN TO PATIENT Stroke warning signs and symptoms How to activate emergency medical system (call 911). Medications prescribed at discharge. Need for follow-up after discharge. Personal risk factors for stroke. Pneumonia vaccine given: No Flu vaccine given: No My questions have been answered, the writing is legible, and I understand these instructions.  I will adhere to these goals & educational materials that have been provided to me after my discharge from the hospital.

## 2014-10-12 NOTE — Telephone Encounter (Signed)
Relation to XI:DHWY  Call back number:[phone] Pharmacy: [id number] - [place], [place] [phone] (Phone) [phone] (Fax)         Reason for call:  Patient requesting a 3 month supply of   pantoprazole (PROTONIX) EC tablet 40 mg

## 2014-10-12 NOTE — Telephone Encounter (Signed)
Rx faxed.    KP 

## 2014-10-12 NOTE — Progress Notes (Signed)
VASCULAR LAB PRELIMINARY  PRELIMINARY  PRELIMINARY  PRELIMINARY  Carotid duplex completed.    Preliminary report:  Bilateral - No evidence of ICA stenosis. Vertebral artery flow is antegrade   Skye Rodarte, RVS 10/12/2014, 2:42 PM

## 2014-10-12 NOTE — Consult Note (Signed)
Admit date: 10/11/2014 Referring Physician  Dr. Eliseo Squires Primary Physician Garnet Koyanagi, DO Primary Cardiologist  New Reason for Consultation  Chest pain  HPI: 49 year old with no prior cardiovascular history that was admitted with intermittent chest discomfort, numbness in her left arm, left leg, left side of her face, occasional shortness of breath, diaphoresis that has been subacute for the past 2 or 3 weeks. Recently saw primary physician, diagnosed with GERD, CT of her abdomen was normal. EKG was normal. Her numbness which is mostly left-sided lasted approximately 30 minutes as.  Nondiabetic, no hyperlipidemia. No early family history of CAD.    PMH:   Past Medical History  Diagnosis Date  . Melanoma     left buttock  . Chicken pox   . Depression   . GERD (gastroesophageal reflux disease)     PSH:   Past Surgical History  Procedure Laterality Date  . Skin cancer removal  August 14, 2011    2 lymph nodes removed as well  . Tonsilectomy/adenoidectomy with myringotomy    . Wrist surgery  1991/1192    ganglion cyst r wrist  . Melanoma excision  2013   Allergies:  Vicodin Prior to Admit Meds:   Prior to Admission medications   Medication Sig Start Date End Date Taking? Authorizing Provider  ALLERGY 10 MG tablet Take 10 mg by mouth every morning. 12/30/13  Yes Historical Provider, MD  Ascorbic Acid (VITAMIN C) 1000 MG tablet Take 1,000 mg by mouth daily.   Yes Historical Provider, MD  buPROPion (WELLBUTRIN XL) 300 MG 24 hr tablet TAKE 1 TABLET (300 MG TOTAL) BY MOUTH DAILY. 09/27/14  Yes Rosalita Chessman, DO  cetirizine (ZYRTEC) 10 MG tablet Take 10 mg by mouth daily.   Yes Historical Provider, MD  cholecalciferol (VITAMIN D) 1000 UNITS tablet Take 1,000 Units by mouth daily.   Yes Historical Provider, MD  Cyanocobalamin (VITAMIN B-12 PO) Take 1 tablet by mouth daily.   Yes Historical Provider, MD  fluticasone (FLONASE) 50 MCG/ACT nasal spray Place 2 sprays into both nostrils daily.    Yes Historical Provider, MD  montelukast (SINGULAIR) 10 MG tablet Take 1 tablet (10 mg total) by mouth at bedtime. 07/06/14  Yes Rosalita Chessman, DO  norethindrone-ethinyl estradiol 1/35 (ORTHO-NOVUM, NORTREL,CYCLAFEM) tablet Take 1 tablet by mouth daily.   Yes Historical Provider, MD  pantoprazole (PROTONIX) 40 MG tablet Take 1 tablet (40 mg total) by mouth daily. Patient taking differently: Take 40 mg by mouth 2 (two) times daily.  09/27/14  Yes Yvonne R Lowne, DO  ranitidine (ZANTAC) 150 MG capsule Take 150 mg by mouth 2 (two) times daily. 12/30/13  Yes Historical Provider, MD  zinc gluconate 50 MG tablet Take 50 mg by mouth daily.   Yes Historical Provider, MD   Fam HX:    Family History  Problem Relation Age of Onset  . Alzheimer's disease Father   . Colon cancer Father   . Breast cancer      on mother and father side  . Depression    . Hypertension     Social HX:    Social History   Social History  . Marital Status: Married    Spouse Name: N/A  . Number of Children: N/A  . Years of Education: N/A   Occupational History  . Not on file.   Social History Main Topics  . Smoking status: Never Smoker   . Smokeless tobacco: Not on file  . Alcohol Use: Yes  Comment: occassionally  . Drug Use: No  . Sexual Activity: Not on file   Other Topics Concern  . Not on file   Social History Narrative     ROS:  All 11 ROS were addressed and are negative except what is stated in the HPI   Physical Exam: Blood pressure 124/77, pulse 91, temperature 98.3 F (36.8 C), temperature source Oral, resp. rate 18, height 5\' 4"  (1.626 m), weight 195 lb (88.451 kg), last menstrual period 10/08/2014, SpO2 100 %.   General: Well developed, well nourished, in no acute distress Head: Eyes PERRLA, No xanthomas.   Normal cephalic and atramatic  Lungs:   Clear bilaterally to auscultation and percussion. Normal respiratory effort. No wheezes, no rales. Heart:   HRRR S1 S2 Pulses are 2+ & equal.  No murmur, rubs, gallops.  No carotid bruit. No JVD.  No abdominal bruits.  Abdomen: Bowel sounds are positive, abdomen soft and non-tender without masses. No hepatosplenomegaly. Msk:  Back normal. Normal strength and tone for age. Extremities:  No clubbing, cyanosis or edema.  DP +1 Neuro: Alert and oriented X 3, non-focal, MAE x 4 GU: Deferred Rectal: Deferred Psych:  Good affect, responds appropriately      Labs: Lab Results  Component Value Date   WBC 10.2 10/11/2014   HGB 14.0 10/11/2014   HCT 42.5 10/11/2014   MCV 95.3 10/11/2014   PLT 307 10/11/2014     Recent Labs Lab 10/11/14 1640  NA 139  K 3.7  CL 104  CO2 25  BUN 12  CREATININE 0.64  CALCIUM 8.9  PROT 6.8  BILITOT 0.5  ALKPHOS 41  ALT 13*  AST 18  GLUCOSE 100*    Recent Labs  10/11/14 1559  TROPONINI <0.03   No results found for: CHOL, HDL, LDLCALC, TRIG No results found for: DDIMER   Radiology:  Dg Chest 2 View  10/11/2014   CLINICAL DATA:  Left chest pain for 3 weeks question acid reflux. Left-sided body numbness for 1-2 days.  EXAM: CHEST  2 VIEW  COMPARISON:  None.  FINDINGS: The heart size and mediastinal contours are within normal limits. Both lungs are clear. The visualized skeletal structures are unremarkable.  IMPRESSION: No active cardiopulmonary disease.   Electronically Signed   By: Nolon Nations M.D.   On: 10/11/2014 16:35   Ct Head Wo Contrast  10/11/2014   CLINICAL DATA:  LEFT-sided numbness ingested a.  Chest pain  EXAM: CT HEAD WITHOUT CONTRAST  TECHNIQUE: Contiguous axial images were obtained from the base of the skull through the vertex without intravenous contrast.  COMPARISON:  None.  FINDINGS: No acute intracranial hemorrhage. No focal mass lesion. No CT evidence of acute infarction. No midline shift or mass effect. No hydrocephalus. Basilar cisterns are patent. Paranasal sinuses and mastoid air cells are clear.  IMPRESSION: Normal head CTA   Electronically Signed   By: Suzy Bouchard M.D.   On: 10/11/2014 17:05   Mr Brain Wo Contrast  10/12/2014   CLINICAL DATA:  Initial evaluation for intermittent left-sided numbness.  EXAM: MRI HEAD WITHOUT CONTRAST  TECHNIQUE: Multiplanar, multiecho pulse sequences of the brain and surrounding structures were obtained without intravenous contrast.  COMPARISON:  Prior CT from 10/11/2014.  FINDINGS: The CSF containing spaces are within normal limits for patient age. No focal parenchymal signal abnormality is identified. No mass lesion, midline shift, or extra-axial fluid collection. Ventricles are normal in size without evidence of hydrocephalus.  No diffusion-weighted signal abnormality  is identified to suggest acute intracranial infarct. Gray-white matter differentiation is maintained. Normal flow voids are seen within the intracranial vasculature. No intracranial hemorrhage identified.  The cervicomedullary junction is normal. Pituitary gland is within normal limits. Pituitary stalk is midline. The globes and optic nerves demonstrate a normal appearance with normal signal intensity.  The bone marrow signal intensity is normal. Calvarium is intact. Visualized upper cervical spine is within normal limits.  Scalp soft tissues are unremarkable.  Paranasal sinuses are clear.  No mastoid effusion.  IMPRESSION: Normal MRI of the brain.   Electronically Signed   By: Jeannine Boga M.D.   On: 10/12/2014 03:07   Personally viewed.  EKG:   10/11/14-sinus tachycardia rate 110, poor R-wave progression, otherwise unremarkable. Personally viewed.   MRI of brain as above normal. No stroke. U tox was normal.  ASSESSMENT/PLAN:    49 year old female with atypical chest pain, transient left sided body numbness, normal MRI of brain, sinus tachycardia, normal troponin.  1. Atypical chest pain-recent diagnosis of GERD. Sharp quality chest pain. In fact, this has improved since addition of Protonix. Troponin on arrival was normal. I will order a second  troponin. If troponin is normal, we will go ahead and set up for outpatient stress test. If abnormal, we will proceed with diagnostic cardiac catheterization.  2. Left-sided body numbness-MRI reassuring. Question etiology. Per primary team. She questions if this could be anxiety related.  Discharge per primary team. I'm comfortable cardiac perspective for discharge after second troponin result is back, and normal hopefully.    Candee Furbish, MD  10/12/2014  10:26 AM

## 2014-10-13 ENCOUNTER — Telehealth: Payer: Self-pay | Admitting: Family Medicine

## 2014-10-13 ENCOUNTER — Telehealth: Payer: Self-pay | Admitting: *Deleted

## 2014-10-13 LAB — HEMOGLOBIN A1C
Hgb A1c MFr Bld: 5.2 % (ref 4.8–5.6)
MEAN PLASMA GLUCOSE: 103 mg/dL

## 2014-10-13 NOTE — Telephone Encounter (Signed)
TCM call completed with patient.

## 2014-10-13 NOTE — Telephone Encounter (Signed)
Transition Care Management Follow-up Telephone Call  Recommendations for Outpatient Follow-up:  1. HgbA1C 2. Hold on ASA for now- numbness sounds like anxiety and recent GERD diagnosis- consider after GI evaluation   Date discharged? 10/12/14    How have you been since you were released from the hospital? "just tired" no numbness or anything, no headaches     Do you understand why you were in the hospital? YES    Do you understand the discharge instructions? YES    Where were you discharged to? Home with husband    Items Reviewed:  Medications reviewed: yes  Allergies reviewed: yes  Dietary changes reviewed: yes- heart healthy   Referrals reviewed: yes - patient has stress test scheduled on 12th, Dr. Myrtice Lauth- GI scheduled 9/15   Functional Questionnaire:   Activities of Daily Living (ADLs):   She states they are independent in the following: ambulation, bathing and hygiene, feeding, continence, grooming, toileting and dressing States they require assistance with the following: none   Any transportation issues/concerns?: no    Any patient concerns? no   Confirmed importance and date/time of follow-up visits scheduled yes 10/25/14  Provider Appointment booked with Dr. Etter Sjogren   Confirmed with patient if condition begins to worsen call PCP or go to the ER.  Patient was given the office number and encouraged to call back with question or concerns.  : yes

## 2014-10-13 NOTE — Telephone Encounter (Signed)
I can't make her keep an appointment--- she need hosp f/u--- we can discuss then

## 2014-10-13 NOTE — Telephone Encounter (Signed)
Pt scheduled hospital f/u 10/25/14, dc 10/12/14 dx of anxiety, she said cpe was done 09/27/14 when here but not documented as cpe, she wants to cancel bc she's had all sorts of labs done in the last couple weeks and doesn't think it is necessary. Ok to cancel?

## 2014-10-13 NOTE — Telephone Encounter (Signed)
To MD to review and advise      KP 

## 2014-10-13 NOTE — Telephone Encounter (Signed)
Patient scheduled hospital f/u (will call for TCM call) but would like to cancel physical.  Office visit from 09/27/14 is not documented as physical, other concerns addressed.  Please see below and advise.

## 2014-10-14 ENCOUNTER — Telehealth (HOSPITAL_COMMUNITY): Payer: Self-pay | Admitting: *Deleted

## 2014-10-14 NOTE — Telephone Encounter (Signed)
Patient given detailed instructions per Myocardial Perfusion Study Information Sheet for test on 10/19/14 at 1200. Patient notified to arrive 15 minutes early and that it is imperative to arrive on time for appointment to keep from having the test rescheduled.  If you need to cancel or reschedule your appointment, please call the office within 24 hours of your appointment. Failure to do so may result in a cancellation of your appointment, and a $50 no show fee. Patient verbalized understanding. Hubbard Robinson, RN

## 2014-10-18 ENCOUNTER — Ambulatory Visit (HOSPITAL_COMMUNITY): Payer: BLUE CROSS/BLUE SHIELD | Attending: Cardiovascular Disease

## 2014-10-18 DIAGNOSIS — R0602 Shortness of breath: Secondary | ICD-10-CM | POA: Insufficient documentation

## 2014-10-18 DIAGNOSIS — R0789 Other chest pain: Secondary | ICD-10-CM | POA: Diagnosis not present

## 2014-10-18 DIAGNOSIS — R0609 Other forms of dyspnea: Secondary | ICD-10-CM | POA: Insufficient documentation

## 2014-10-18 LAB — MYOCARDIAL PERFUSION IMAGING
CHL CUP NUCLEAR SDS: 1
CHL CUP NUCLEAR SRS: 0
CHL CUP RESTING HR STRESS: 90 {beats}/min
CSEPED: 6 min
CSEPEDS: 0 s
CSEPEW: 7 METS
LV dias vol: 68 mL
LV sys vol: 19 mL
MPHR: 171 {beats}/min
NUC STRESS TID: 0.86
Peak HR: 173 {beats}/min
Percent HR: 101 %
RATE: 0.29
SSS: 1

## 2014-10-18 MED ORDER — TECHNETIUM TC 99M SESTAMIBI GENERIC - CARDIOLITE
32.9000 | Freq: Once | INTRAVENOUS | Status: AC | PRN
  Administered 2014-10-18: 32.9 via INTRAVENOUS

## 2014-10-18 MED ORDER — TECHNETIUM TC 99M SESTAMIBI GENERIC - CARDIOLITE
10.8000 | Freq: Once | INTRAVENOUS | Status: AC | PRN
  Administered 2014-10-18: 11 via INTRAVENOUS

## 2014-10-21 ENCOUNTER — Ambulatory Visit: Payer: BLUE CROSS/BLUE SHIELD | Admitting: Gastroenterology

## 2014-10-25 ENCOUNTER — Encounter: Payer: Self-pay | Admitting: Family Medicine

## 2014-10-25 ENCOUNTER — Ambulatory Visit (INDEPENDENT_AMBULATORY_CARE_PROVIDER_SITE_OTHER): Payer: BLUE CROSS/BLUE SHIELD | Admitting: Family Medicine

## 2014-10-25 VITALS — BP 114/82 | HR 90 | Temp 98.8°F | Ht 65.0 in | Wt 198.6 lb

## 2014-10-25 DIAGNOSIS — K219 Gastro-esophageal reflux disease without esophagitis: Secondary | ICD-10-CM | POA: Insufficient documentation

## 2014-10-25 DIAGNOSIS — F411 Generalized anxiety disorder: Secondary | ICD-10-CM

## 2014-10-25 DIAGNOSIS — F418 Other specified anxiety disorders: Secondary | ICD-10-CM | POA: Diagnosis not present

## 2014-10-25 DIAGNOSIS — F419 Anxiety disorder, unspecified: Secondary | ICD-10-CM | POA: Insufficient documentation

## 2014-10-25 DIAGNOSIS — R200 Anesthesia of skin: Secondary | ICD-10-CM

## 2014-10-25 DIAGNOSIS — F329 Major depressive disorder, single episode, unspecified: Secondary | ICD-10-CM | POA: Insufficient documentation

## 2014-10-25 MED ORDER — CITALOPRAM HYDROBROMIDE 10 MG PO TABS: 10.0000 mg | ORAL_TABLET | Freq: Every day | ORAL | Status: DC

## 2014-10-25 NOTE — Assessment & Plan Note (Signed)
F/u GI con't protonix

## 2014-10-25 NOTE — Progress Notes (Addendum)
Patient ID: Vicki Le, female    DOB: December 10, 1965  Age: 49 y.o. MRN: [id number]    Subjective:  Subjective HPI Vicki Le presents for f/u hospital with L sided numbness / chest pain.  All symptoms resolved  Pt admits to being under a lot of stress.  It was felt that her symptoms may have been from [place] and anxiety.  She had to reschedule her appointment with GI.  She was admitted 9/5- 10/12/14 MRI, Echo and carotid all good.      Review of Systems  Constitutional: Negative for diaphoresis, appetite change, fatigue and unexpected weight change.  Eyes: Negative for pain, redness and visual disturbance.  Respiratory: Negative for cough, chest tightness, shortness of breath and wheezing.   Cardiovascular: Negative for chest pain, palpitations and leg swelling.  Endocrine: Negative for cold intolerance, heat intolerance, polydipsia, polyphagia and polyuria.  Genitourinary: Negative for dysuria, frequency and difficulty urinating.  Musculoskeletal: Negative for back pain, arthralgias and gait problem.  Neurological: Negative for dizziness, light-headedness, numbness and headaches.  Psychiatric/Behavioral: Positive for dysphoric mood. Negative for suicidal ideas, sleep disturbance and self-injury. The patient is nervous/anxious.   All other systems reviewed and are negative.   History Past Medical History  Diagnosis Date  . Melanoma     left buttock  . Chicken pox   . Depression   . GERD (gastroesophageal reflux disease)     She has past surgical history that includes skin cancer removal (August 14, 2011); Tonsilectomy/adenoidectomy with myringotomy; Wrist surgery (1991/1192); and Melanoma excision (2013).   Her family history includes Alzheimer's disease in her father; Breast cancer in an other family member; Colon cancer in her father; Depression in an other family member; Hypertension in an other family member.She reports that she has never smoked. She does not have any smokeless  tobacco history on file. She reports that she drinks alcohol. She reports that she does not use illicit drugs.  Current Outpatient Prescriptions on File Prior to Visit  Medication Sig Dispense Refill  . Ascorbic Acid (VITAMIN C) 1000 MG tablet Take 1,000 mg by mouth daily.    Marland Kitchen buPROPion (WELLBUTRIN XL) 300 MG 24 hr tablet TAKE 1 TABLET (300 MG TOTAL) BY MOUTH DAILY. 90 tablet 1  . cetirizine (ZYRTEC) 10 MG tablet Take 10 mg by mouth daily.    . cholecalciferol (VITAMIN D) 1000 UNITS tablet Take 1,000 Units by mouth daily.    . Cyanocobalamin (VITAMIN B-12 PO) Take 1 tablet by mouth daily.    . fluticasone (FLONASE) 50 MCG/ACT nasal spray Place 2 sprays into both nostrils daily.    Marland Kitchen loratadine (CLARITIN) 10 MG tablet Take 10 mg by mouth daily.    . montelukast (SINGULAIR) 10 MG tablet Take 1 tablet (10 mg total) by mouth at bedtime. 90 tablet 3  . norethindrone-ethinyl estradiol 1/35 (ORTHO-NOVUM, NORTREL,CYCLAFEM) tablet Take 1 tablet by mouth daily.    . pantoprazole (PROTONIX) 40 MG tablet Take 1 tablet (40 mg total) by mouth 2 (two) times daily. 180 tablet 1  . ranitidine (ZANTAC) 150 MG capsule Take 150 mg by mouth 2 (two) times daily.  1  . zinc gluconate 50 MG tablet Take 50 mg by mouth daily.     No current facility-administered medications on file prior to visit.     Objective:  Objective Physical Exam  Constitutional: She is oriented to person, place, and time. She appears well-developed and well-nourished. No distress.  HENT:  Head: Normocephalic and atraumatic.  Right  Ear: Tympanic membrane normal.  Left Ear: Tympanic membrane normal.  Nose: Mucosal edema and rhinorrhea present. Right sinus exhibits maxillary sinus tenderness and frontal sinus tenderness. Left sinus exhibits maxillary sinus tenderness and frontal sinus tenderness.  Mouth/Throat: Uvula is midline and mucous membranes are normal. Posterior oropharyngeal erythema present. No oropharyngeal exudate.  Eyes:  Conjunctivae and EOM are normal. Pupils are equal, round, and reactive to light.  Neck: Normal range of motion. Neck supple. No JVD present. Carotid bruit is not present. No thyromegaly present.  Cardiovascular: Normal rate, regular rhythm and normal heart sounds.   No murmur heard. Pulmonary/Chest: Effort normal and breath sounds normal. No respiratory distress. She has no wheezes. She has no rales. She exhibits no tenderness.  Musculoskeletal: She exhibits no edema.  Lymphadenopathy:    She has no cervical adenopathy.  Neurological: She is alert and oriented to person, place, and time.  Psychiatric: Her speech is normal and behavior is normal. Judgment and thought content normal. Her mood appears anxious. Her affect is not blunt, not labile and not inappropriate. Cognition and memory are normal. She exhibits a depressed mood.  Nursing note and vitals reviewed.  BP 114/82 mmHg  Pulse 90  Temp(Src) 98.8 F (37.1 C) (Oral)  Ht 5\' 5"  (1.651 m)  Wt 198 lb 9.6 oz (90.084 kg)  BMI 33.05 kg/m2  SpO2 98%  LMP 10/08/2014 Wt Readings from Last 3 Encounters:  10/25/14 198 lb 9.6 oz (90.084 kg)  10/18/14 198 lb (89.812 kg)  10/11/14 195 lb (88.451 kg)     Lab Results  Component Value Date   WBC 10.2 10/11/2014   HGB 14.0 10/11/2014   HCT 42.5 10/11/2014   PLT 307 10/11/2014   GLUCOSE 100* 10/11/2014   CHOL 185 10/12/2014   TRIG 255* 10/12/2014   HDL 58 10/12/2014   LDLCALC 76 10/12/2014   ALT 13* 10/11/2014   AST 18 10/11/2014   NA 139 10/11/2014   K 3.7 10/11/2014   CL 104 10/11/2014   CREATININE 0.64 10/11/2014   BUN 12 10/11/2014   CO2 25 10/11/2014   TSH 1.402 01/08/2014   HGBA1C 5.2 10/12/2014    Dg Chest 2 View  10/11/2014   CLINICAL DATA:  Left chest pain for 3 weeks question acid reflux. Left-sided body numbness for 1-2 days.  EXAM: CHEST  2 VIEW  COMPARISON:  None.  FINDINGS: The heart size and mediastinal contours are within normal limits. Both lungs are clear. The  visualized skeletal structures are unremarkable.  IMPRESSION: No active cardiopulmonary disease.   Electronically Signed   By: Nolon Nations M.D.   On: 10/11/2014 16:35   Ct Head Wo Contrast  10/11/2014   CLINICAL DATA:  LEFT-sided numbness ingested a.  Chest pain  EXAM: CT HEAD WITHOUT CONTRAST  TECHNIQUE: Contiguous axial images were obtained from the base of the skull through the vertex without intravenous contrast.  COMPARISON:  None.  FINDINGS: No acute intracranial hemorrhage. No focal mass lesion. No CT evidence of acute infarction. No midline shift or mass effect. No hydrocephalus. Basilar cisterns are patent. Paranasal sinuses and mastoid air cells are clear.  IMPRESSION: Normal head CTA   Electronically Signed   By: Suzy Bouchard M.D.   On: 10/11/2014 17:05   Mr Brain Wo Contrast  10/12/2014   CLINICAL DATA:  Initial evaluation for intermittent left-sided numbness.  EXAM: MRI HEAD WITHOUT CONTRAST  TECHNIQUE: Multiplanar, multiecho pulse sequences of the brain and surrounding structures were obtained without intravenous contrast.  COMPARISON:  Prior CT from 10/11/2014.  FINDINGS: The CSF containing spaces are within normal limits for patient age. No focal parenchymal signal abnormality is identified. No mass lesion, midline shift, or extra-axial fluid collection. Ventricles are normal in size without evidence of hydrocephalus.  No diffusion-weighted signal abnormality is identified to suggest acute intracranial infarct. Gray-white matter differentiation is maintained. Normal flow voids are seen within the intracranial vasculature. No intracranial hemorrhage identified.  The cervicomedullary junction is normal. Pituitary gland is within normal limits. Pituitary stalk is midline. The globes and optic nerves demonstrate a normal appearance with normal signal intensity.  The bone marrow signal intensity is normal. Calvarium is intact. Visualized upper cervical spine is within normal limits.  Scalp  soft tissues are unremarkable.  Paranasal sinuses are clear.  No mastoid effusion.  IMPRESSION: Normal MRI of the brain.   Electronically Signed   By: Jeannine Boga M.D.   On: 10/12/2014 03:07     Assessment & Plan:  Plan I am having Ms. Fruchter start on citalopram. I am also having her maintain her Cyanocobalamin (VITAMIN B-12 PO), cholecalciferol, vitamin C, zinc gluconate, fluticasone, cetirizine, ranitidine, montelukast, buPROPion, norethindrone-ethinyl estradiol 1/35, pantoprazole, and loratadine.  Meds ordered this encounter  Medications  . citalopram (CELEXA) 10 MG tablet    Sig: Take 1 tablet (10 mg total) by mouth daily.    Dispense:  30 tablet    Refill:  5    Problem List Items Addressed This Visit    Left sided numbness    With some chest pain Felt to be gerd/ anxiety But out pt stress recommended      GERD (gastroesophageal reflux disease)    F/u GI con't protonix      Anxiety and depression    con't wellbutrin Add celexa 10 mg 1/2 tab po qd, may increase to 1 tab in 1-2 weeks Call us with any problems otherwise we will see her in 1 month       Other Visit Diagnoses    Generalized anxiety disorder    -  Primary    Relevant Medications    citalopram (CELEXA) 10 MG tablet    Gastroesophageal reflux disease without esophagitis          Flu shot refused Follow-up: Return in about 4 weeks (around 11/22/2014), or if symptoms worsen or fail to improve, for anxiety depression.  Garnet Koyanagi, DO

## 2014-10-25 NOTE — Assessment & Plan Note (Addendum)
With some chest pain Felt to be gerd/ anxiety But out pt stress recommended-- also neg

## 2014-10-25 NOTE — Assessment & Plan Note (Signed)
con't wellbutrin Add celexa 10 mg 1/2 tab po qd, may increase to 1 tab in 1-2 weeks Call us with any problems otherwise we will see her in 1 month

## 2014-10-25 NOTE — Progress Notes (Signed)
Pre visit review using our clinic review tool, if applicable. No additional management support is needed unless otherwise documented below in the visit note. 

## 2014-10-25 NOTE — Patient Instructions (Signed)
Generalized Anxiety Disorder Generalized anxiety disorder (GAD) is a mental disorder. It interferes with life functions, including relationships, work, and school. GAD is different from normal anxiety, which everyone experiences at some point in their lives in response to specific life events and activities. Normal anxiety actually helps us prepare for and get through these life events and activities. Normal anxiety goes away after the event or activity is over.  GAD causes anxiety that is not necessarily related to specific events or activities. It also causes excess anxiety in proportion to specific events or activities. The anxiety associated with GAD is also difficult to control. GAD can vary from mild to severe. People with severe GAD can have intense waves of anxiety with physical symptoms (panic attacks).  SYMPTOMS The anxiety and worry associated with GAD are difficult to control. This anxiety and worry are related to many life events and activities and also occur more days than not for 6 months or longer. People with GAD also have three or more of the following symptoms (one or more in children):  Restlessness.   Fatigue.  Difficulty concentrating.   Irritability.  Muscle tension.  Difficulty sleeping or unsatisfying sleep. DIAGNOSIS GAD is diagnosed through an assessment by your health care provider. Your health care provider will ask you questions aboutyour mood,physical symptoms, and events in your life. Your health care provider may ask you about your medical history and use of alcohol or drugs, including prescription medicines. Your health care provider may also do a physical exam and blood tests. Certain medical conditions and the use of certain substances can cause symptoms similar to those associated with GAD. Your health care provider may refer you to a mental health specialist for further evaluation. TREATMENT The following therapies are usually used to treat GAD:    Medication. Antidepressant medication usually is prescribed for long-term daily control. Antianxiety medicines may be added in severe cases, especially when panic attacks occur.   Talk therapy (psychotherapy). Certain types of talk therapy can be helpful in treating GAD by providing support, education, and guidance. A form of talk therapy called cognitive behavioral therapy can teach you healthy ways to think about and react to daily life events and activities.  Stress managementtechniques. These include yoga, meditation, and exercise and can be very helpful when they are practiced regularly. A mental health specialist can help determine which treatment is best for you. Some people see improvement with one therapy. However, other people require a combination of therapies. Document Released: 05/19/2012 Document Revised: 06/08/2013 Document Reviewed: 05/19/2012 [employer] Patient Information 2015 [employer]. This information is not intended to replace advice given to you by your health care provider. Make sure you discuss any questions you have with your health care provider.  

## 2014-10-29 ENCOUNTER — Encounter: Payer: Self-pay | Admitting: Family Medicine

## 2014-11-25 ENCOUNTER — Ambulatory Visit: Payer: BLUE CROSS/BLUE SHIELD | Admitting: Family Medicine

## 2014-11-29 ENCOUNTER — Ambulatory Visit (INDEPENDENT_AMBULATORY_CARE_PROVIDER_SITE_OTHER): Payer: BLUE CROSS/BLUE SHIELD | Admitting: Physician Assistant

## 2014-11-29 ENCOUNTER — Encounter: Payer: Self-pay | Admitting: Physician Assistant

## 2014-11-29 VITALS — BP 137/92 | HR 90 | Temp 98.4°F | Resp 16 | Ht 65.0 in | Wt 195.2 lb

## 2014-11-29 DIAGNOSIS — J069 Acute upper respiratory infection, unspecified: Secondary | ICD-10-CM | POA: Diagnosis not present

## 2014-11-29 DIAGNOSIS — B9789 Other viral agents as the cause of diseases classified elsewhere: Principal | ICD-10-CM

## 2014-11-29 MED ORDER — BENZONATATE 200 MG PO CAPS: 200.0000 mg | ORAL_CAPSULE | Freq: Three times a day (TID) | ORAL | Status: DC | PRN

## 2014-11-29 MED ORDER — FLUTICASONE PROPIONATE 50 MCG/ACT NA SUSP: 2.0000 | Freq: Every day | NASAL | Status: DC

## 2014-11-29 NOTE — Patient Instructions (Signed)
Please continue allergy medications, but restart the Flonase (refills have been sent in). Increase fluids and place a humidifier in the bedroom. Use the Tessalon as directed for cough. Alternate Tylenol and Ibuprofen for scratchy throat. Follow-up if symptoms are not resolving.

## 2014-11-29 NOTE — Assessment & Plan Note (Signed)
Exam unremarkable. Continue allergy medications. Will begin Flonase. Rx Tessalon for cough. Supportive measures reviewed. Follow-up PRN if symptoms are not resolving.

## 2014-11-29 NOTE — Progress Notes (Signed)
Patient presents to clinic today c/o chest tightness and PND associated with non-productive cough. Endorses significant sore throat today. Denies fever, chills, recent travel. Symptoms have been present x 2 days. Husband with similar symptoms x 3 days.  Past Medical History  Diagnosis Date  . 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 Chicken pox   . Depression   . GERD (gastroesophageal reflux disease)     Current Outpatient Prescriptions on File Prior to Visit  Medication Sig Dispense Refill  . Ascorbic Acid (VITAMIN C) 1000 MG tablet Take 1,000 mg by mouth daily.    Marland Kitchen buPROPion (WELLBUTRIN XL) 300 MG 24 hr tablet TAKE 1 TABLET (300 MG TOTAL) BY MOUTH DAILY. 90 tablet 1  . cetirizine (ZYRTEC) 10 MG tablet Take 10 mg by mouth daily.    . cholecalciferol (VITAMIN D) 1000 UNITS tablet Take 1,000 Units by mouth daily.    . Cyanocobalamin (VITAMIN B-12 PO) Take 1 tablet by mouth daily.    Marland Kitchen loratadine (CLARITIN) 10 MG tablet Take 10 mg by mouth daily.    . montelukast (SINGULAIR) 10 MG tablet Take 1 tablet (10 mg total) by mouth at bedtime. 90 tablet 3  . norethindrone-ethinyl estradiol 1/35 (ORTHO-NOVUM, NORTREL,CYCLAFEM) tablet Take 1 tablet by mouth daily.    . pantoprazole (PROTONIX) 40 MG tablet Take 1 tablet (40 mg total) by mouth 2 (two) times daily. 180 tablet 1  . ranitidine (ZANTAC) 150 MG capsule Take 150 mg by mouth 2 (two) times daily.  1  . zinc gluconate 50 MG tablet Take 50 mg by mouth daily.     No current facility-administered medications on file prior to visit.    Allergies  Allergen Reactions  . Vicodin [Hydrocodone-Acetaminophen] Nausea And Vomiting    Family History  Problem Relation Age of Onset  . Alzheimer's disease Father   . Colon cancer Father   . Breast cancer      on mother and father side  . Depression    . Hypertension      Social History   Social History  . Marital Status: Married    Spouse Name: N/A  . Number of Children: N/A  . Years of  Education: N/A   Social History Main Topics  . Smoking status: Never Smoker   . Smokeless tobacco: None  . Alcohol Use: Yes     Comment: occassionally  . Drug Use: No  . Sexual Activity: Not Asked   Other Topics Concern  . None   Social History Narrative   Review of Systems - See HPI.  All other ROS are negative.  BP 137/92 mmHg  Pulse 90  Temp(Src) 98.4 F (36.9 C) (Oral)  Resp 16  Ht _0  (1.651 m)  Wt 195 lb 4 oz (88.565 kg)  BMI 32.49 kg/m2  SpO2 99%  LMP 11/01/2014  Physical Exam  Constitutional: She is oriented to person, place, and time and well-developed, well-nourished, and in no distress.  HENT:  Head: Normocephalic and atraumatic.  Right Ear: External ear normal.  Left Ear: External ear normal.  Nose: Nose normal.  Mouth/Throat: Oropharynx is clear and moist. No oropharyngeal exudate.  TM within normal limits bilaterally.  Eyes: Conjunctivae are normal.  Neck: Neck supple.  Cardiovascular: Normal rate, regular rhythm, normal heart sounds and intact distal pulses.   Pulmonary/Chest: Effort normal and breath sounds normal. No respiratory distress. She has no wheezes. She has no rales. She exhibits no tenderness.  Lymphadenopathy:  She has no cervical adenopathy.  Neurological: She is alert and oriented to person, place, and time.  Skin: Skin is warm and dry. No rash noted.  Psychiatric: Affect normal.  Vitals reviewed.   Recent Results (from the past 2160 hour(s))  Amylase     Status: Abnormal   Collection Time: 09/27/14 12:24 PM  Result Value Ref Range   Amylase 20 (L) 27 - 131 U/L  Lipase     Status: None   Collection Time: 09/27/14 12:24 PM  Result Value Ref Range   Lipase 23.0 11.0 - 59.0 U/L  CBC with Differential/Platelet     Status: None   Collection Time: 09/27/14 12:24 PM  Result Value Ref Range   WBC 8.0 4.0 - 10.5 K/uL   RBC 4.29 3.87 - 5.11 Mil/uL   Hemoglobin 13.6 12.0 - 15.0 g/dL   HCT 40.1 36.0 - 46.0 %   MCV 93.5 78.0 -  100.0 fl   MCHC 33.8 30.0 - 36.0 g/dL   RDW 12.7 11.5 - 15.5 %   Platelets 282.0 150.0 - 400.0 K/uL   Neutrophils Relative % 74.2 43.0 - 77.0 %   Lymphocytes Relative 19.4 12.0 - 46.0 %   Monocytes Relative 4.9 3.0 - 12.0 %   Eosinophils Relative 1.1 0.0 - 5.0 %   Basophils Relative 0.4 0.0 - 3.0 %   Neutro Abs 5.9 1.4 - 7.7 K/uL   Lymphs Abs 1.6 0.7 - 4.0 K/uL   Monocytes Absolute 0.4 0.1 - 1.0 K/uL   Eosinophils Absolute 0.1 0.0 - 0.7 K/uL   Basophils Absolute 0.0 0.0 - 0.1 K/uL  Comprehensive metabolic panel     Status: Abnormal   Collection Time: 09/27/14 12:24 PM  Result Value Ref Range   Sodium 138 135 - 145 mEq/L   Potassium 3.6 3.5 - 5.1 mEq/L   Chloride 103 96 - 112 mEq/L   CO2 27 19 - 32 mEq/L   Glucose, Bld 103 (H) 70 - 99 mg/dL   BUN 8 6 - 23 mg/dL   Creatinine, Ser 0.59 0.40 - 1.20 mg/dL   Total Bilirubin 0.4 0.2 - 1.2 mg/dL   Alkaline Phosphatase 37 (L) 39 - 117 U/L   AST 9 0 - 37 U/L   ALT 8 0 - 35 U/L   Total Protein 6.9 6.0 - 8.3 g/dL   Albumin 4.2 3.5 - 5.2 g/dL   Calcium 9.1 8.4 - 10.5 mg/dL   GFR 114.74 >60.00 mL/min  H. pylori antibody, IgG     Status: None   Collection Time: 09/27/14 12:24 PM  Result Value Ref Range   H Pylori IgG Negative Negative  POCT urinalysis dipstick     Status: None   Collection Time: 09/27/14  3:24 PM  Result Value Ref Range   Color, UA yellow    Clarity, UA clear    Glucose, UA neg    Bilirubin, UA neg    Ketones, UA 2+    Spec Grav, UA 1.015    Blood, UA neg    pH, UA 6.0    Protein, UA neg    Urobilinogen, UA 2.0    Nitrite, UA neg    Leukocytes, UA Negative Negative  Troponin I     Status: None   Collection Time: 10/11/14  3:59 PM  Result Value Ref Range   Troponin I <0.03 <0.031 ng/mL    Comment:        NO INDICATION OF MYOCARDIAL INJURY.   CBC  Status: None   Collection Time: 10/11/14  3:59 PM  Result Value Ref Range   WBC 10.2 4.0 - 10.5 K/uL   RBC 4.46 3.87 - 5.11 MIL/uL   Hemoglobin 14.0 12.0 -  15.0 g/dL   HCT 42.5 36.0 - 46.0 %   MCV 95.3 78.0 - 100.0 fL   MCH 31.4 26.0 - 34.0 pg   MCHC 32.9 30.0 - 36.0 g/dL   RDW 12.7 11.5 - 15.5 %   Platelets 307 150 - 400 K/uL  Comprehensive metabolic panel     Status: Abnormal   Collection Time: 10/11/14  4:40 PM  Result Value Ref Range   Sodium 139 135 - 145 mmol/L   Potassium 3.7 3.5 - 5.1 mmol/L   Chloride 104 101 - 111 mmol/L   CO2 25 22 - 32 mmol/L   Glucose, Bld 100 (H) 65 - 99 mg/dL   BUN 12 6 - 20 mg/dL   Creatinine, Ser 0.64 0.44 - 1.00 mg/dL   Calcium 8.9 8.9 - 10.3 mg/dL   Total Protein 6.8 6.5 - 8.1 g/dL   Albumin 3.9 3.5 - 5.0 g/dL   AST 18 15 - 41 U/L   ALT 13 (L) 14 - 54 U/L   Alkaline Phosphatase 41 38 - 126 U/L   Total Bilirubin 0.5 0.3 - 1.2 mg/dL   GFR calc non Af Amer >60 >60 mL/min   GFR calc Af Amer >60 >60 mL/min    Comment: (NOTE) The eGFR has been calculated using the CKD EPI equation. This calculation has not been validated in all clinical situations. eGFR's persistently <60 mL/min signify possible Chronic Kidney Disease.    Anion gap 10 5 - 15  Urinalysis, Routine w reflex microscopic (not at [facility])     Status: None   Collection Time: 10/11/14  5:05 PM  Result Value Ref Range   Color, Urine YELLOW YELLOW   APPearance CLEAR CLEAR   Specific Gravity, Urine 1.010 1.005 - 1.030   pH 7.5 5.0 - 8.0   Glucose, UA NEGATIVE NEGATIVE mg/dL   Hgb urine dipstick NEGATIVE NEGATIVE   Bilirubin Urine NEGATIVE NEGATIVE   Ketones, ur NEGATIVE NEGATIVE mg/dL   Protein, ur NEGATIVE NEGATIVE mg/dL   Urobilinogen, UA 0.2 0.0 - 1.0 mg/dL   Nitrite NEGATIVE NEGATIVE   Leukocytes, UA NEGATIVE NEGATIVE    Comment: MICROSCOPIC NOT DONE ON URINES WITH NEGATIVE PROTEIN, BLOOD, LEUKOCYTES, NITRITE, OR GLUCOSE <1000 mg/dL.  Urine rapid drug screen (hosp performed)     Status: None   Collection Time: 10/11/14  5:05 PM  Result Value Ref Range   Opiates NONE DETECTED NONE DETECTED   Cocaine NONE DETECTED NONE DETECTED    Benzodiazepines NONE DETECTED NONE DETECTED   Amphetamines NONE DETECTED NONE DETECTED   Tetrahydrocannabinol NONE DETECTED NONE DETECTED   Barbiturates NONE DETECTED NONE DETECTED    Comment:        DRUG SCREEN FOR MEDICAL PURPOSES ONLY.  IF CONFIRMATION IS NEEDED FOR ANY PURPOSE, NOTIFY LAB WITHIN 5 DAYS.        LOWEST DETECTABLE LIMITS FOR URINE DRUG SCREEN Drug Class       Cutoff (ng/mL) Amphetamine      1000 Barbiturate      200 Benzodiazepine   170 Tricyclics       017 Opiates          300 Cocaine          300 THC  50   Lipid panel     Status: Abnormal   Collection Time: 10/12/14 11:06 AM  Result Value Ref Range   Cholesterol 185 0 - 200 mg/dL   Triglycerides 255 (H) <150 mg/dL   HDL 58 >40 mg/dL   Total CHOL/HDL Ratio 3.2 RATIO   VLDL 51 (H) 0 - 40 mg/dL   LDL Cholesterol 76 0 - 99 mg/dL    Comment:        Total Cholesterol/HDL:CHD Risk Coronary Heart Disease Risk Table                     Men   Women  1/2 Average Risk   3.4   3.3  Average Risk       5.0   4.4  2 X Average Risk   9.6   7.1  3 X Average Risk  23.4   11.0        Use the calculated Patient Ratio above and the CHD Risk Table to determine the patient's CHD Risk.        ATP III CLASSIFICATION (LDL):  <100     mg/dL   Optimal  100-129  mg/dL   Near or Above                    Optimal  130-159  mg/dL   Borderline  160-189  mg/dL   High  >190     mg/dL   Very High   Troponin I     Status: None   Collection Time: 10/12/14 11:06 AM  Result Value Ref Range   Troponin I <0.03 <0.031 ng/mL    Comment:        NO INDICATION OF MYOCARDIAL INJURY.   Hemoglobin A1c     Status: None   Collection Time: 10/12/14 11:30 AM  Result Value Ref Range   Hgb A1c MFr Bld 5.2 4.8 - 5.6 %    Comment: (NOTE)         Pre-diabetes: 5.7 - 6.4         Diabetes: >6.4         Glycemic control for adults with diabetes: <7.0    Mean Plasma Glucose 103 mg/dL    Comment: (NOTE) Performed At: [facility] [place], [place] [id number] Lindon Romp MD [id number]   Myocardial Perfusion Imaging     Status: None   Collection Time: 10/18/14  2:53 PM  Result Value Ref Range   Rest HR 90 bpm   Rest BP 117/84 mmHg   Exercise duration (min) 6 min   Exercise duration (sec) 0 sec   Estimated workload 7.0 METS   Peak HR 173 bpm   Peak BP 165/90 mmHg   MPHR 171 bpm   Percent HR 101 %   RPE     LV Systolic Volume 19 mL   TID 7.35    LV Diastolic Volume 68 mL   LHR 0.29    SSS 1    SRS 0    SDS 1     Assessment/Plan: Viral URI with cough Exam unremarkable. Continue allergy medications. Will begin Flonase. Rx Tessalon for cough. Supportive measures reviewed. Follow-up PRN if symptoms are not resolving.

## 2014-12-03 ENCOUNTER — Telehealth: Payer: Self-pay | Admitting: Family Medicine

## 2014-12-03 MED ORDER — AZITHROMYCIN 250 MG PO TABS: ORAL_TABLET | ORAL | Status: DC

## 2014-12-03 NOTE — Telephone Encounter (Signed)
Rx Azithromycin sent to her pharmacy.

## 2014-12-03 NOTE — Telephone Encounter (Signed)
Caller name: Vicki Le Relationship to patient: self Can be reached: [phone] Pharmacy: [id number] - [place], [place]  Reason for call: Pt is now feeling worse and having congestion and discolored mucus. She is out of work today not feeling well. She is asking if abx can be called in for her. She was in 11/29/14.

## 2014-12-03 NOTE — Telephone Encounter (Signed)
Patient informed, understood & agreed/SLS  

## 2014-12-07 ENCOUNTER — Ambulatory Visit: Payer: BLUE CROSS/BLUE SHIELD | Admitting: Gastroenterology

## 2015-01-03 LAB — HM PAP SMEAR: HM PAP: NORMAL

## 2015-01-13 ENCOUNTER — Encounter: Payer: Self-pay | Admitting: Family Medicine

## 2015-02-09 ENCOUNTER — Ambulatory Visit (INDEPENDENT_AMBULATORY_CARE_PROVIDER_SITE_OTHER): Payer: BLUE CROSS/BLUE SHIELD | Admitting: Gastroenterology

## 2015-02-09 ENCOUNTER — Encounter: Payer: Self-pay | Admitting: Gastroenterology

## 2015-02-09 VITALS — BP 130/80 | HR 84 | Ht 65.0 in | Wt 198.1 lb

## 2015-02-09 DIAGNOSIS — K219 Gastro-esophageal reflux disease without esophagitis: Secondary | ICD-10-CM

## 2015-02-09 DIAGNOSIS — R197 Diarrhea, unspecified: Secondary | ICD-10-CM

## 2015-02-09 DIAGNOSIS — Z1211 Encounter for screening for malignant neoplasm of colon: Secondary | ICD-10-CM

## 2015-02-09 MED ORDER — RIFAXIMIN 550 MG PO TABS: 550.0000 mg | ORAL_TABLET | Freq: Three times a day (TID) | ORAL | Status: DC

## 2015-02-09 NOTE — Patient Instructions (Addendum)
Please begin the Low FODMAP diet.   We have sent medications to your pharmacy for you to pick up at your convenience.  You have been scheduled for a colonoscopy. Please follow written instructions given to you at your visit today.  Please pick up your prep supplies at the pharmacy within the next 1-3 days. If you use inhalers (even only as needed), please bring them with you on the day of your procedure. Your physician has requested that you go to www.[email or web address].com and enter the access code given to you at your visit today. This web site gives a general overview about your procedure. However, you should still follow specific instructions given to you by our office regarding your preparation for the procedure.

## 2015-02-09 NOTE — Progress Notes (Signed)
HPI :  50 y/o female, seen in consultation for abdominal symptoms as below, referred by Dr. Etter Sjogren.   Patient has had some intermittent abdominal symptoms which are longstanding, present for years. She reports when nervous she has the urge to have a bowel movement. She reports on average she has variable BMs, anywhere from one to 4 BMs per day. Stools are rarely solid, usually has loose stools. She has increased urge to have a bowel movement with most stools. She has some lower abdominal discomfort, cramping sensation. She reports it is intermittent, and relieved with a bowel movement reliably. She reports significant gas and bloating with these symptoms. Symptoms ongoing for years. No blood in the stools. No FH of celiac, Crohns, or colitis.  Father was diagnosed with colon polyps and ?colon cancer, requiring a surgical resection of his colon. He was diagnosed around before age 63. She has never had a prior colonoscopy.   GERD - she has been on omeprazole for a period of time and worked well for her, however developed an episode of chest pain with epigastric abdominal discomfort which she thought was due to reflux. She had her PPI changed to protonix 40mg  BID, and stopped Omeprazole in late August / September. She reports since making this switch she has no reflux symptoms - no heartburn, no chest pains. She has no dysphagia. No odynophagia. No weight loss. She reports she initially did not respond too well to once per day dosing, much better on the high dose of protonix. She takes zantac as well but she states due to her allergies, in conjunction with zyrtec. No prior EGD. No FH of esophageal cancer. No prior tobacco.   She reports she had a workup and hospitalized at [facility] for Neurologic symptoms, and told it was due to anxiety following negative workup for CVA/ TIA. She has not had recurrence of these symptoms. She has also had a negative cardiac stress test during this time.      Past Medical  History  Diagnosis Date  . Melanoma ([place])     left buttock  . Chicken pox   . Depression   . GERD (gastroesophageal reflux disease)   . Anxiety     Past Surgical History  Procedure Laterality Date  . Skin cancer removal  August 14, 2011    2 lymph nodes removed as well  . Tonsilectomy/adenoidectomy with myringotomy    . Wrist surgery  1991/1192    ganglion cyst r wrist  . Melanoma excision  2013   Family History  Problem Relation Age of Onset  . Alzheimer's disease Father   . Colon cancer Father   . Breast cancer      on mother and father side  . Depression    . Hypertension     Social History  Substance Use Topics  . Smoking status: Never Smoker   . Smokeless tobacco: None  . Alcohol Use: Yes     Comment: occassionally   Current Outpatient Prescriptions  Medication Sig Dispense Refill  . Ascorbic Acid (VITAMIN C) 1000 MG tablet Take 1,000 mg by mouth daily.    Marland Kitchen buPROPion (WELLBUTRIN XL) 300 MG 24 hr tablet TAKE 1 TABLET (300 MG TOTAL) BY MOUTH DAILY. 90 tablet 1  . cetirizine (ZYRTEC) 10 MG tablet Take 10 mg by mouth daily.    . cholecalciferol (VITAMIN D) 1000 UNITS tablet Take 1,000 Units by mouth daily.    . Cyanocobalamin (VITAMIN B-12 PO) Take 1 tablet by mouth  daily.    . fluticasone (FLONASE) 50 MCG/ACT nasal spray Place 2 sprays into both nostrils daily. 16 g 5  . loratadine (CLARITIN) 10 MG tablet Take 10 mg by mouth daily.    Marland Kitchen MIMVEY 1-0.5 MG tablet Take 1 tablet by mouth daily.    . montelukast (SINGULAIR) 10 MG tablet Take 1 tablet (10 mg total) by mouth at bedtime. 90 tablet 3  . pantoprazole (PROTONIX) 40 MG tablet Take 1 tablet (40 mg total) by mouth 2 (two) times daily. 180 tablet 1  . ranitidine (ZANTAC) 150 MG capsule Take 150 mg by mouth 2 (two) times daily.  1  . zinc gluconate 50 MG tablet Take 50 mg by mouth daily.    . rifaximin (XIFAXAN) 550 MG TABS tablet Take 1 tablet (550 mg total) by mouth 3 (three) times daily. 42 tablet 0   No current  facility-administered medications for this visit.   Allergies  Allergen Reactions  . Vicodin [Hydrocodone-Acetaminophen] Nausea And Vomiting     Review of Systems: All systems reviewed and negative except where noted in HPI.   Lab Results  Component Value Date   WBC 10.2 10/11/2014   HGB 14.0 10/11/2014   HCT 42.5 10/11/2014   MCV 95.3 10/11/2014   PLT 307 10/11/2014    Lab Results  Component Value Date   CREATININE 0.64 10/11/2014   BUN 12 10/11/2014   NA 139 10/11/2014   K 3.7 10/11/2014   CL 104 10/11/2014   CO2 25 10/11/2014    Lab Results  Component Value Date   ALT 13* 10/11/2014   AST 18 10/11/2014   ALKPHOS 41 10/11/2014   BILITOT 0.5 10/11/2014     Physical Exam: BP 130/80 mmHg  Pulse 84  Ht 5\' 5"  (1.651 m)  Wt 198 lb 2 oz (89.869 kg)  BMI 32.97 kg/m2  LMP 01/23/2015 Constitutional: Pleasant,well-developed, female in no acute distress. HEENT: Normocephalic and atraumatic. Conjunctivae are normal. No scleral icterus. Neck supple.  Cardiovascular: Normal rate, regular rhythm.  Pulmonary/chest: Effort normal and breath sounds normal. No wheezing, rales or rhonchi. Abdominal: Soft, nondistended, nontender. Bowel sounds active throughout. There are no masses palpable. No hepatomegaly. Extremities: no edema Lymphadenopathy: No cervical adenopathy noted. Neurological: Alert and oriented to person place and time. Skin: Skin is warm and dry. No rashes noted. Psychiatric: Normal mood and affect. Behavior is normal.   ASSESSMENT AND PLAN: 50 y/o female with a history of anxiety and GERD seen in consultation for longstanding abdominal symptoms as outlined above. She has a family history of colon cancer in her father and has had no prior CRC screening.   Regarding her chronic loose stools and intermittent abdominal cramping, I suspect she more than likely has IBS-D. No anemia or alarm symptoms but she does have a family history of colon cancer. In light of  her age and family history, she is due for CRC screening and discussed optical colonoscopy with her in light of these symptoms. Following our discussion as outlined above she wished to proceed with this. Otherwise, I counseled her on a low FODMOP diet and gave an educational handout on it to see if this helps. Otherwise, discussed treatment options for IBS-D, and will try her on a course of this for 2 weeks. Follow up if no improvement on this regimen. I will otherwise screen her for celiac disease with labs.   CRC screening and FH of colon cancer - scheduled for screening colonoscopy as above. The indications, risks,  and benefits of colonoscopy were explained to the patient in detail. Risks include but are not limited to bleeding, perforation, adverse reaction to medications, and cardiopulmonary compromise. Sequelae include but are not limited to the possibility of surgery, hospitalization, and mortality. The patient verbalized understanding and wished to proceed. All questions answered, referred to the scheduler and bowel prep ordered. Further recommendations pending results of the exam.   GERD - longstanding, well controlled on BID protonix at this time. The risks of long term PPIs with current data include increased risk for chronic kidney disease, increased risk of fracture, increased risk of C diff, increased risk of pneumonia (short term usage), potentially increased risk of dementia, potentially increased risk of B12 / calcium deficiency, and rare risk of hypomagnesemia. Patient was counseled to use the lowest daily use of PPI needed to control symptoms. Renal function is currently normal and would recommend checking this yearly while on PPI therapy. I don't feel strongly that she warrants BE screening at this time, and counseled her it is no longer routinely recommended in women per the the ACG guidelines. She has no tobacco use or FH of esophageal cancer and no dysphagia, and symptoms responding to  PPI. However, I offered EGD to her for piece of mind if she was anxious about it, but she declined. She will try to go down to 40mg  daily of protonix over time and see if this controls her, if not, will increase back dose to BID.   [place] Cellar, MD [facility] Pager [phone]  CC: Rosalita Chessman, DO

## 2015-02-10 ENCOUNTER — Other Ambulatory Visit: Payer: Self-pay

## 2015-02-10 DIAGNOSIS — Z1211 Encounter for screening for malignant neoplasm of colon: Secondary | ICD-10-CM

## 2015-02-10 DIAGNOSIS — R197 Diarrhea, unspecified: Secondary | ICD-10-CM

## 2015-02-10 MED ORDER — NA SULFATE-K SULFATE-MG SULF 17.5-3.13-1.6 GM/177ML PO SOLN: ORAL | Status: DC

## 2015-02-15 ENCOUNTER — Telehealth: Payer: Self-pay

## 2015-02-15 NOTE — Telephone Encounter (Signed)
Patient was approved for Xifaxan.

## 2015-03-25 ENCOUNTER — Encounter: Payer: BLUE CROSS/BLUE SHIELD | Admitting: Gastroenterology

## 2015-03-27 ENCOUNTER — Other Ambulatory Visit: Payer: Self-pay | Admitting: Family Medicine

## 2015-03-30 ENCOUNTER — Other Ambulatory Visit: Payer: Self-pay | Admitting: Family Medicine

## 2015-04-04 ENCOUNTER — Other Ambulatory Visit: Payer: Self-pay | Admitting: Family Medicine

## 2015-04-08 ENCOUNTER — Ambulatory Visit: Payer: BLUE CROSS/BLUE SHIELD | Admitting: Family Medicine

## 2015-05-05 ENCOUNTER — Other Ambulatory Visit: Payer: Self-pay | Admitting: Family Medicine

## 2015-05-06 ENCOUNTER — Ambulatory Visit (INDEPENDENT_AMBULATORY_CARE_PROVIDER_SITE_OTHER): Payer: BLUE CROSS/BLUE SHIELD | Admitting: Family Medicine

## 2015-05-06 ENCOUNTER — Encounter: Payer: Self-pay | Admitting: Family Medicine

## 2015-05-06 VITALS — BP 120/78 | HR 99 | Temp 99.2°F | Ht 65.0 in | Wt 200.1 lb

## 2015-05-06 DIAGNOSIS — J069 Acute upper respiratory infection, unspecified: Secondary | ICD-10-CM

## 2015-05-06 DIAGNOSIS — J029 Acute pharyngitis, unspecified: Secondary | ICD-10-CM

## 2015-05-06 LAB — POCT RAPID STREP A (OFFICE): RAPID STREP A SCREEN: NEGATIVE

## 2015-05-06 MED ORDER — AMOXICILLIN 875 MG PO TABS: 875.0000 mg | ORAL_TABLET | Freq: Two times a day (BID) | ORAL | Status: DC

## 2015-05-06 NOTE — Patient Instructions (Signed)

## 2015-05-06 NOTE — Progress Notes (Signed)
Subjective:    Patient ID: Vicki Le, female    DOB: Jun 01, 1965, 50 y.o.   MRN: [id number]  Chief Complaint  Patient presents with  . Sore Throat    sinus drainage  . Fatigue    HPI Patient is in today for sore throat x 3 days with some nasal congestion. No fevers / chills or bodyaches.    Past Medical History  Diagnosis Date  . Melanoma ([place])     left buttock  . Chicken pox   . Depression   . GERD (gastroesophageal reflux disease)   . Anxiety     Past Surgical History  Procedure Laterality Date  . Skin cancer removal  August 14, 2011    2 lymph nodes removed as well  . Tonsilectomy/adenoidectomy with myringotomy    . Wrist surgery  1991/1192    ganglion cyst r wrist  . Melanoma excision  2013    Family History  Problem Relation Age of Onset  . Alzheimer's disease Father   . Colon cancer Father   . Breast cancer      on mother and father side  . Depression    . Hypertension      Social History   Social History  . Marital Status: Married    Spouse Name: N/A  . Number of Children: N/A  . Years of Education: N/A   Occupational History  . Not on file.   Social History Main Topics  . Smoking status: Never Smoker   . Smokeless tobacco: Not on file  . Alcohol Use: Yes     Comment: occassionally  . Drug Use: No  . Sexual Activity: Not on file   Other Topics Concern  . Not on file   Social History Narrative    Outpatient Prescriptions Prior to Visit  Medication Sig Dispense Refill  . Ascorbic Acid (VITAMIN C) 1000 MG tablet Take 1,000 mg by mouth daily.    Marland Kitchen buPROPion (WELLBUTRIN XL) 300 MG 24 hr tablet Take 1 tablet (300 mg total) by mouth daily. Office visit due now 90 tablet 0  . cetirizine (ZYRTEC) 10 MG tablet Take 10 mg by mouth daily.    . cholecalciferol (VITAMIN D) 1000 UNITS tablet Take 1,000 Units by mouth daily.    . Cyanocobalamin (VITAMIN B-12 PO) Take 1 tablet by mouth daily.    . fluticasone (FLONASE) 50 MCG/ACT nasal spray USE 2  SPRAYS IN EACH NOSTRIL ONCE DAILY 48 g 1  . loratadine (CLARITIN) 10 MG tablet Take 10 mg by mouth daily.    Marland Kitchen MIMVEY 1-0.5 MG tablet Take 1 tablet by mouth daily.    . montelukast (SINGULAIR) 10 MG tablet TAKE 1 TABLET BY MOUTH AT BEDTIME. 90 tablet 1  . pantoprazole (PROTONIX) 40 MG tablet TAKE 1 TABLET (40 MG TOTAL) BY MOUTH 2 (TWO) TIMES DAILY. 180 tablet 0  . ranitidine (ZANTAC) 150 MG capsule Take 150 mg by mouth 2 (two) times daily.  1  . zinc gluconate 50 MG tablet Take 50 mg by mouth daily.    . Na Sulfate-K Sulfate-Mg Sulf SOLN Take as directed per Colonoscopy. 354 mL 0  . rifaximin (XIFAXAN) 550 MG TABS tablet Take 1 tablet (550 mg total) by mouth 3 (three) times daily. 42 tablet 0   No facility-administered medications prior to visit.    Allergies  Allergen Reactions  . Vicodin [Hydrocodone-Acetaminophen] Nausea And Vomiting    Review of Systems  Constitutional: Negative for fever and malaise/fatigue.  HENT: Positive for sore throat. Negative for congestion.   Eyes: Negative for blurred vision.  Respiratory: Negative for shortness of breath.   Cardiovascular: Negative for chest pain, palpitations and leg swelling.  Gastrointestinal: Negative for nausea, abdominal pain and blood in stool.  Genitourinary: Negative for dysuria and frequency.  Musculoskeletal: Negative for falls.  Skin: Negative for rash.  Neurological: Negative for dizziness, loss of consciousness and headaches.  Endo/Heme/Allergies: Negative for environmental allergies.  Psychiatric/Behavioral: Negative for depression. The patient is not nervous/anxious.        Objective:    Physical Exam  Constitutional: She is oriented to person, place, and time. She appears well-developed and well-nourished. No distress.  HENT:  Head: Normocephalic and atraumatic.  Nose: Rhinorrhea present.  Mouth/Throat: Posterior oropharyngeal erythema present. No oropharyngeal exudate.  Eyes: Conjunctivae are normal.  Neck:  Neck supple. No thyromegaly present.  Cardiovascular: Normal rate, regular rhythm and normal heart sounds.   No murmur heard. Pulmonary/Chest: Effort normal and breath sounds normal. No respiratory distress.  Abdominal: Soft. Bowel sounds are normal. She exhibits no distension and no mass. There is no tenderness.  Musculoskeletal: She exhibits no edema.  Lymphadenopathy:    She has no cervical adenopathy.  Neurological: She is alert and oriented to person, place, and time.  Skin: Skin is warm and dry.  Psychiatric: She has a normal mood and affect. Her behavior is normal.   Strep --neg  BP 120/78 mmHg  Pulse 99  Temp(Src) 99.2 F (37.3 C) (Oral)  Ht 5\' 5"  (1.651 m)  Wt 200 lb 2 oz (90.776 kg)  BMI 33.30 kg/m2  SpO2 97% Wt Readings from Last 3 Encounters:  05/06/15 200 lb 2 oz (90.776 kg)  02/09/15 198 lb 2 oz (89.869 kg)  11/29/14 195 lb 4 oz (88.565 kg)     Lab Results  Component Value Date   WBC 10.2 10/11/2014   HGB 14.0 10/11/2014   HCT 42.5 10/11/2014   PLT 307 10/11/2014   GLUCOSE 100* 10/11/2014   CHOL 185 10/12/2014   TRIG 255* 10/12/2014   HDL 58 10/12/2014   LDLCALC 76 10/12/2014   ALT 13* 10/11/2014   AST 18 10/11/2014   NA 139 10/11/2014   K 3.7 10/11/2014   CL 104 10/11/2014   CREATININE 0.64 10/11/2014   BUN 12 10/11/2014   CO2 25 10/11/2014   TSH 1.402 01/08/2014   HGBA1C 5.2 10/12/2014    Lab Results  Component Value Date   TSH 1.402 01/08/2014   Lab Results  Component Value Date   WBC 10.2 10/11/2014   HGB 14.0 10/11/2014   HCT 42.5 10/11/2014   MCV 95.3 10/11/2014   PLT 307 10/11/2014   Lab Results  Component Value Date   NA 139 10/11/2014   K 3.7 10/11/2014   CO2 25 10/11/2014   GLUCOSE 100* 10/11/2014   BUN 12 10/11/2014   CREATININE 0.64 10/11/2014   BILITOT 0.5 10/11/2014   ALKPHOS 41 10/11/2014   AST 18 10/11/2014   ALT 13* 10/11/2014   PROT 6.8 10/11/2014   ALBUMIN 3.9 10/11/2014   CALCIUM 8.9 10/11/2014   ANIONGAP  10 10/11/2014   GFR 114.74 09/27/2014   Lab Results  Component Value Date   CHOL 185 10/12/2014   Lab Results  Component Value Date   HDL 58 10/12/2014   Lab Results  Component Value Date   LDLCALC 76 10/12/2014   Lab Results  Component Value Date   TRIG 255* 10/12/2014  Lab Results  Component Value Date   CHOLHDL 3.2 10/12/2014   Lab Results  Component Value Date   HGBA1C 5.2 10/12/2014       Assessment & Plan:   Problem List Items Addressed This Visit      Unprioritized   Acute URI - Primary    Pt losing ins today rx for abx given to take only if she gets worse.  Pt understands and agrees con't antihistamine and nasal spray.        Relevant Medications   amoxicillin (AMOXIL) 875 MG tablet    Other Visit Diagnoses    Sore throat        Relevant Medications    amoxicillin (AMOXIL) 875 MG tablet    Other Relevant Orders    POCT rapid strep A (Completed)       I have discontinued Ms. Hoos's rifaximin and Na Sulfate-K Sulfate-Mg Sulf. I am also having her start on amoxicillin. Additionally, I am having her maintain her Cyanocobalamin (VITAMIN B-12 PO), cholecalciferol, vitamin C, zinc gluconate, cetirizine, ranitidine, loratadine, MIMVEY, buPROPion, fluticasone, pantoprazole, and montelukast.  Meds ordered this encounter  Medications  . amoxicillin (AMOXIL) 875 MG tablet    Sig: Take 1 tablet (875 mg total) by mouth 2 (two) times daily.    Dispense:  20 tablet    Refill:  0     Ann Held, DO

## 2015-05-07 NOTE — Assessment & Plan Note (Signed)
Pt losing ins today rx for abx given to take only if she gets worse.  Pt understands and agrees con't antihistamine and nasal spray.

## 2015-06-13 ENCOUNTER — Other Ambulatory Visit: Payer: Self-pay | Admitting: Family Medicine

## 2015-06-30 ENCOUNTER — Telehealth: Payer: Self-pay | Admitting: Family Medicine

## 2015-06-30 NOTE — Telephone Encounter (Signed)
Can be reached: [phone]   Reason for call: pt is out of work and out of insurance right now. Her bupropion and pantoprazole is expensive and she is asking if we have coupons/discount cards for either one. Please call her back.

## 2015-06-30 NOTE — Telephone Encounter (Signed)
Message left to call the office.    KP 

## 2015-07-01 MED ORDER — PANTOPRAZOLE SODIUM 40 MG PO TBEC: DELAYED_RELEASE_TABLET | ORAL | Status: DC

## 2015-07-01 NOTE — Addendum Note (Signed)
Addended by: Ewing Schlein on: 07/01/2015 10:55 AM   Modules accepted: Orders

## 2015-07-01 NOTE — Telephone Encounter (Signed)
Spoke with patient and I made her aware I have Protonix coupons, she declined switching Wellbutrin at this time, because it is working for her. She requested that I mail the Protonix coupon. Coupon mailed.     KP

## 2016-01-14 ENCOUNTER — Other Ambulatory Visit: Payer: Self-pay | Admitting: Family Medicine

## 2016-02-16 ENCOUNTER — Ambulatory Visit (INDEPENDENT_AMBULATORY_CARE_PROVIDER_SITE_OTHER): Payer: PRIVATE HEALTH INSURANCE | Admitting: Family Medicine

## 2016-02-16 ENCOUNTER — Encounter: Payer: Self-pay | Admitting: Family Medicine

## 2016-02-16 VITALS — BP 136/86 | HR 99 | Temp 98.8°F | Resp 16 | Ht 65.0 in | Wt 211.2 lb

## 2016-02-16 DIAGNOSIS — J0140 Acute pansinusitis, unspecified: Secondary | ICD-10-CM

## 2016-02-16 DIAGNOSIS — R0683 Snoring: Secondary | ICD-10-CM

## 2016-02-16 DIAGNOSIS — F329 Major depressive disorder, single episode, unspecified: Secondary | ICD-10-CM | POA: Diagnosis not present

## 2016-02-16 DIAGNOSIS — K219 Gastro-esophageal reflux disease without esophagitis: Secondary | ICD-10-CM

## 2016-02-16 DIAGNOSIS — F32A Depression, unspecified: Secondary | ICD-10-CM

## 2016-02-16 MED ORDER — PANTOPRAZOLE SODIUM 40 MG PO TBEC
DELAYED_RELEASE_TABLET | ORAL | 1 refills | Status: DC
Start: 2016-02-16 — End: 2017-01-07

## 2016-02-16 MED ORDER — FLUTICASONE PROPIONATE 50 MCG/ACT NA SUSP: 2.0000 | Freq: Every day | NASAL | 3 refills | Status: DC

## 2016-02-16 MED ORDER — AMOXICILLIN-POT CLAVULANATE 875-125 MG PO TABS: 1.0000 | ORAL_TABLET | Freq: Two times a day (BID) | ORAL | 0 refills | Status: DC

## 2016-02-16 MED ORDER — BUPROPION HCL ER (XL) 150 MG PO TB24
ORAL_TABLET | ORAL | 3 refills | Status: DC
Start: 2016-02-16 — End: 2016-03-22

## 2016-02-16 NOTE — Progress Notes (Signed)
Patient ID: Vicki Le, female    DOB: 05-05-1965  Age: 51 y.o. MRN: [id number]    Subjective:  Subjective  HPI ALIZAH PION presents for f/u depression -- she is stressed but med help.   Pt c/o sinus congestion x since last Friday.  + tightness in chest ,  + cough, productive--esp at night Fever,  + sinus congestion  Review of Systems  Constitutional: Negative for activity change, appetite change, chills, diaphoresis, fatigue, fever and unexpected weight change.  HENT: Positive for congestion, postnasal drip, rhinorrhea and sinus pressure.   Eyes: Negative for pain, redness and visual disturbance.  Respiratory: Positive for cough and chest tightness. Negative for shortness of breath and wheezing.   Cardiovascular: Negative for chest pain, palpitations and leg swelling.  Gastrointestinal: Negative for abdominal distention and abdominal pain.  Endocrine: Negative for cold intolerance, heat intolerance, polydipsia, polyphagia and polyuria.  Genitourinary: Negative for difficulty urinating, dyspareunia, dysuria, flank pain, frequency, genital sores, hematuria, menstrual problem, pelvic pain, urgency, vaginal discharge and vaginal pain.  Musculoskeletal: Negative for back pain.  Allergic/Immunologic: Negative for environmental allergies.  Neurological: Negative for dizziness, light-headedness, numbness and headaches.    History Past Medical History:  Diagnosis Date  . Anxiety   . Chicken pox   . Depression   . GERD (gastroesophageal reflux disease)   . Melanoma ([place])    left buttock    She has a past surgical history that includes skin cancer removal (August 14, 2011); Tonsilectomy/adenoidectomy with myringotomy; Wrist surgery (1991/1192); and Melanoma excision (2013).   Her family history includes Alzheimer's disease in her father; Colon cancer in her father.She reports that she has never smoked. She does not have any smokeless tobacco history on file. She reports that she drinks  alcohol. She reports that she does not use drugs.  Current Outpatient Prescriptions on File Prior to Visit  Medication Sig Dispense Refill  . Ascorbic Acid (VITAMIN C) 1000 MG tablet Take 1,000 mg by mouth daily.    . cetirizine (ZYRTEC) 10 MG tablet Take 10 mg by mouth daily.    . cholecalciferol (VITAMIN D) 1000 UNITS tablet Take 1,000 Units by mouth daily.    Marland Kitchen MIMVEY 1-0.5 MG tablet Take 1 tablet by mouth daily.    Marland Kitchen zinc gluconate 50 MG tablet Take 50 mg by mouth daily.     No current facility-administered medications on file prior to visit.      Objective:  Objective  Physical Exam  Constitutional: She is oriented to person, place, and time. She appears well-developed and well-nourished.  HENT:  Right Ear: External ear normal.  Left Ear: External ear normal.  Nose: Right sinus exhibits maxillary sinus tenderness and frontal sinus tenderness. Left sinus exhibits maxillary sinus tenderness and frontal sinus tenderness.  Mouth/Throat: Posterior oropharyngeal erythema present. No oropharyngeal exudate.  + PND + errythema  Eyes: Conjunctivae are normal. Right eye exhibits no discharge. Left eye exhibits no discharge.  Neck: Normal range of motion. Neck supple.  Cardiovascular: Normal rate, regular rhythm and normal heart sounds.   No murmur heard. Pulmonary/Chest: Effort normal and breath sounds normal. No respiratory distress. She has no wheezes. She has no rales. She exhibits no tenderness.  Musculoskeletal: She exhibits no edema.  Lymphadenopathy:    She has cervical adenopathy.  Neurological: She is alert and oriented to person, place, and time.  Nursing note and vitals reviewed.  BP 136/86 (BP Location: Left Arm, Cuff Size: Normal)   Pulse 99   Temp  98.8 F (37.1 C) (Oral)   Resp 16   Ht 5\' 5"  (1.651 m)   Wt 211 lb 3.2 oz (95.8 kg)   LMP 02/09/2016   SpO2 98%   BMI 35.15 kg/m  Wt Readings from Last 3 Encounters:  02/16/16 211 lb 3.2 oz (95.8 kg)  05/06/15 200 lb  2 oz (90.8 kg)  02/09/15 198 lb 2 oz (89.9 kg)     Lab Results  Component Value Date   WBC 10.2 10/11/2014   HGB 14.0 10/11/2014   HCT 42.5 10/11/2014   PLT 307 10/11/2014   GLUCOSE 100 (H) 10/11/2014   CHOL 185 10/12/2014   TRIG 255 (H) 10/12/2014   HDL 58 10/12/2014   LDLCALC 76 10/12/2014   ALT 13 (L) 10/11/2014   AST 18 10/11/2014   NA 139 10/11/2014   K 3.7 10/11/2014   CL 104 10/11/2014   CREATININE 0.64 10/11/2014   BUN 12 10/11/2014   CO2 25 10/11/2014   TSH 1.402 01/08/2014   HGBA1C 5.2 10/12/2014    Dg Chest 2 View  Result Date: 10/11/2014 CLINICAL DATA:  Left chest pain for 3 weeks question acid reflux. Left-sided body numbness for 1-2 days. EXAM: CHEST  2 VIEW COMPARISON:  None. FINDINGS: The heart size and mediastinal contours are within normal limits. Both lungs are clear. The visualized skeletal structures are unremarkable. IMPRESSION: No active cardiopulmonary disease. Electronically Signed   By: Nolon Nations M.D.   On: 10/11/2014 16:35   Ct Head Wo Contrast  Result Date: 10/11/2014 CLINICAL DATA:  LEFT-sided numbness ingested a.  Chest pain EXAM: CT HEAD WITHOUT CONTRAST TECHNIQUE: Contiguous axial images were obtained from the base of the skull through the vertex without intravenous contrast. COMPARISON:  None. FINDINGS: No acute intracranial hemorrhage. No focal mass lesion. No CT evidence of acute infarction. No midline shift or mass effect. No hydrocephalus. Basilar cisterns are patent. Paranasal sinuses and mastoid air cells are clear. IMPRESSION: Normal head CTA Electronically Signed   By: Suzy Bouchard M.D.   On: 10/11/2014 17:05   Mr Brain Wo Contrast  Result Date: 10/12/2014 CLINICAL DATA:  Initial evaluation for intermittent left-sided numbness. EXAM: MRI HEAD WITHOUT CONTRAST TECHNIQUE: Multiplanar, multiecho pulse sequences of the brain and surrounding structures were obtained without intravenous contrast. COMPARISON:  Prior CT from 10/11/2014.  FINDINGS: The CSF containing spaces are within normal limits for patient age. No focal parenchymal signal abnormality is identified. No mass lesion, midline shift, or extra-axial fluid collection. Ventricles are normal in size without evidence of hydrocephalus. No diffusion-weighted signal abnormality is identified to suggest acute intracranial infarct. Gray-white matter differentiation is maintained. Normal flow voids are seen within the intracranial vasculature. No intracranial hemorrhage identified. The cervicomedullary junction is normal. Pituitary gland is within normal limits. Pituitary stalk is midline. The globes and optic nerves demonstrate a normal appearance with normal signal intensity. The bone marrow signal intensity is normal. Calvarium is intact. Visualized upper cervical spine is within normal limits. Scalp soft tissues are unremarkable. Paranasal sinuses are clear.  No mastoid effusion. IMPRESSION: Normal MRI of the brain. Electronically Signed   By: Jeannine Boga M.D.   On: 10/12/2014 03:07     Assessment & Plan:  Plan  I have discontinued Ms. Littlepage's Cyanocobalamin (VITAMIN B-12 PO), ranitidine, loratadine, montelukast, amoxicillin, and buPROPion. I am also having her start on buPROPion and amoxicillin-clavulanate. Additionally, I am having her maintain her cholecalciferol, vitamin C, zinc gluconate, cetirizine, MIMVEY, pantoprazole, and fluticasone.  Meds  ordered this encounter  Medications  . buPROPion (WELLBUTRIN XL) 150 MG 24 hr tablet    Sig: 3 po q am    Dispense:  90 tablet    Refill:  3  . DISCONTD: fluticasone (FLONASE) 50 MCG/ACT nasal spray    Sig: Place 2 sprays into both nostrils daily.    Dispense:  48 g    Refill:  3  . amoxicillin-clavulanate (AUGMENTIN) 875-125 MG tablet    Sig: Take 1 tablet by mouth 2 (two) times daily.    Dispense:  20 tablet    Refill:  0  . pantoprazole (PROTONIX) 40 MG tablet    Sig: TAKE 1 TABLET (40 MG TOTAL) BY MOUTH 2 (TWO)  TIMES DAILY.    Dispense:  180 tablet    Refill:  1  . fluticasone (FLONASE) 50 MCG/ACT nasal spray    Sig: Place 2 sprays into both nostrils daily.    Dispense:  48 g    Refill:  3    Problem List Items Addressed This Visit      Unprioritized   GERD (gastroesophageal reflux disease)   Relevant Medications   pantoprazole (PROTONIX) 40 MG tablet    Other Visit Diagnoses    Depression, unspecified depression type    -  Primary   Relevant Medications   buPROPion (WELLBUTRIN XL) 150 MG 24 hr tablet   Acute pansinusitis, recurrence not specified       Relevant Medications   amoxicillin-clavulanate (AUGMENTIN) 875-125 MG tablet   fluticasone (FLONASE) 50 MCG/ACT nasal spray   Snoring       Relevant Orders   Ambulatory referral to Neurology      Follow-up: Return in about 6 months (around 08/15/2016).  Ann Held, DO

## 2016-02-16 NOTE — Patient Instructions (Signed)

## 2016-02-16 NOTE — Progress Notes (Signed)
Pre visit review using our clinic review tool, if applicable. No additional management support is needed unless otherwise documented below in the visit note. 

## 2016-03-12 ENCOUNTER — Telehealth: Payer: Self-pay | Admitting: Family Medicine

## 2016-03-12 MED ORDER — FLUCONAZOLE 150 MG PO TABS: ORAL_TABLET | ORAL | 0 refills | Status: DC

## 2016-03-12 MED ORDER — LEVOFLOXACIN 500 MG PO TABS: 500.0000 mg | ORAL_TABLET | Freq: Every day | ORAL | 0 refills | Status: DC

## 2016-03-12 NOTE — Telephone Encounter (Signed)
Reason for call: Patient states she feels she needs something else for the congestion. States she has 2 more antibiotic pills but still coughing up mucus. Also, needs a Rx for Diflucan because she now has a yeast infection. Please advise. LB

## 2016-03-12 NOTE — Telephone Encounter (Signed)
Sent in prescriptions as instructed by PCP. Called informed the patient sent in.

## 2016-03-12 NOTE — Telephone Encounter (Signed)
Reason for call: Patient states she feels she needs something else for the congestion. States she has 2 more antibiotic pills but still coughing up mucus. Also, needs a Rx for Diflucan because she now has a yeast infection. Please advised. LB

## 2016-03-12 NOTE — Addendum Note (Signed)
Addended by: Sharon Seller B on: 03/12/2016 01:40 PM   Modules accepted: Orders

## 2016-03-12 NOTE — Telephone Encounter (Signed)
Caller name: Relationship to patient:Seld Can be reached: [place]:  [id number] - [place] [phone] (Phone) [phone] (Fax)     Reason for call: Patient states she feels she needs something else for the congestion. States she has 2 more antibiotic pills but still coughing up mucus. Also, needs a Rx for Diflucan because she now has a yeast infection

## 2016-03-12 NOTE — Telephone Encounter (Signed)
Diflucan 150 mg #2  1 po qd, repeat in 3 days prn  levaquin 500 mg #10   1po qd

## 2016-03-21 ENCOUNTER — Telehealth: Payer: Self-pay | Admitting: Family Medicine

## 2016-03-21 DIAGNOSIS — F32A Depression, unspecified: Secondary | ICD-10-CM

## 2016-03-21 DIAGNOSIS — F329 Major depressive disorder, single episode, unspecified: Secondary | ICD-10-CM

## 2016-03-21 NOTE — Telephone Encounter (Signed)
[id number] [address], [place] [phone]) [phone] (Fax)   Pharmacy in need of clarification regarding buPROPion (WELLBUTRIN XL) 150 MG 24 hr tablet , pharmacy states 90 tablets were prescribed and due to the frequency patient doesn't have enough pills,please advise pharmacy

## 2016-03-22 MED ORDER — BUPROPION HCL ER (XL) 150 MG PO TB24: ORAL_TABLET | ORAL | 3 refills | Status: DC

## 2016-03-22 NOTE — Telephone Encounter (Signed)
Refill done.  

## 2016-06-07 ENCOUNTER — Other Ambulatory Visit: Payer: Self-pay | Admitting: Obstetrics and Gynecology

## 2016-06-07 DIAGNOSIS — R928 Other abnormal and inconclusive findings on diagnostic imaging of breast: Secondary | ICD-10-CM

## 2016-06-12 ENCOUNTER — Ambulatory Visit
Admission: RE | Admit: 2016-06-12 | Discharge: 2016-06-12 | Disposition: A | Discharge status: Home / Self Care | Payer: PRIVATE HEALTH INSURANCE | Source: Ambulatory Visit | Attending: Obstetrics and Gynecology | Admitting: Obstetrics and Gynecology

## 2016-06-12 DIAGNOSIS — R928 Other abnormal and inconclusive findings on diagnostic imaging of breast: Secondary | ICD-10-CM

## 2016-06-14 ENCOUNTER — Other Ambulatory Visit: Payer: Self-pay | Admitting: Family Medicine

## 2016-06-14 DIAGNOSIS — F329 Major depressive disorder, single episode, unspecified: Secondary | ICD-10-CM

## 2016-06-14 DIAGNOSIS — F32A Depression, unspecified: Secondary | ICD-10-CM

## 2016-06-14 MED ORDER — BUPROPION HCL ER (XL) 150 MG PO TB24: ORAL_TABLET | ORAL | 3 refills | Status: DC

## 2016-09-10 ENCOUNTER — Other Ambulatory Visit: Payer: Self-pay | Admitting: Family Medicine

## 2016-09-10 DIAGNOSIS — F329 Major depressive disorder, single episode, unspecified: Secondary | ICD-10-CM

## 2016-09-10 DIAGNOSIS — F32A Depression, unspecified: Secondary | ICD-10-CM

## 2016-10-13 ENCOUNTER — Other Ambulatory Visit: Payer: Self-pay | Admitting: Family Medicine

## 2016-10-13 DIAGNOSIS — F32A Depression, unspecified: Secondary | ICD-10-CM

## 2016-10-13 DIAGNOSIS — F329 Major depressive disorder, single episode, unspecified: Secondary | ICD-10-CM

## 2016-10-29 ENCOUNTER — Encounter: Payer: Self-pay | Admitting: Family Medicine

## 2016-10-29 ENCOUNTER — Ambulatory Visit (INDEPENDENT_AMBULATORY_CARE_PROVIDER_SITE_OTHER): Payer: PRIVATE HEALTH INSURANCE | Admitting: Family Medicine

## 2016-10-29 VITALS — BP 130/86 | HR 91 | Wt 195.0 lb

## 2016-10-29 DIAGNOSIS — T7840XA Allergy, unspecified, initial encounter: Secondary | ICD-10-CM

## 2016-10-29 MED ORDER — FEXOFENADINE HCL 180 MG PO TABS: 180.0000 mg | ORAL_TABLET | Freq: Every day | ORAL | Status: AC

## 2016-10-29 MED ORDER — RANITIDINE HCL 150 MG PO TABS: 150.0000 mg | ORAL_TABLET | Freq: Two times a day (BID) | ORAL | 5 refills | Status: DC

## 2016-10-29 MED ORDER — LEVOCETIRIZINE DIHYDROCHLORIDE 5 MG PO TABS: 5.0000 mg | ORAL_TABLET | Freq: Every evening | ORAL | 5 refills | Status: AC

## 2016-10-29 NOTE — Assessment & Plan Note (Signed)
con't allegra and xyzal per derm Add zantac bid ? If reaction to hormones-- check with gyn Call us if no better

## 2016-10-29 NOTE — Progress Notes (Signed)
Patient ID: Vicki Le, female    DOB: 12/13/1965  Age: 51 y.o. MRN: [id number]    Subjective:  Subjective  HPI Vicki Le presents for itching and red face.   She spoke to her derm who told her to take zyrtec in pm and allegra in am.  It is better but still red and warm.     Review of Systems  Constitutional: Negative for appetite change, diaphoresis, fatigue and unexpected weight change.  Eyes: Negative for pain, redness and visual disturbance.  Respiratory: Negative for cough, chest tightness, shortness of breath and wheezing.   Cardiovascular: Negative for chest pain, palpitations and leg swelling.  Endocrine: Negative for cold intolerance, heat intolerance, polydipsia, polyphagia and polyuria.  Genitourinary: Negative for difficulty urinating, dysuria and frequency.  Skin: Positive for color change and rash.  Neurological: Negative for dizziness, light-headedness, numbness and headaches.    History Past Medical History:  Diagnosis Date  . Anxiety   . Chicken pox   . Depression   . GERD (gastroesophageal reflux disease)   . Melanoma ([place])    left buttock    She has a past surgical history that includes skin cancer removal (August 14, 2011); Tonsilectomy/adenoidectomy with myringotomy; Wrist surgery (1991/1192); and Melanoma excision (2013).   Her family history includes Alzheimer's disease in her father; Breast cancer in her unknown relative; Colon cancer in her father; Depression in her unknown relative; Hypertension in her unknown relative.She reports that she has never smoked. She has never used smokeless tobacco. She reports that she drinks alcohol. She reports that she does not use drugs.  Current Outpatient Prescriptions on File Prior to Visit  Medication Sig Dispense Refill  . amoxicillin-clavulanate (AUGMENTIN) 875-125 MG tablet Take 1 tablet by mouth 2 (two) times daily. 20 tablet 0  . Ascorbic Acid (VITAMIN C) 1000 MG tablet Take 1,000 mg by mouth daily.    Marland Kitchen  buPROPion (WELLBUTRIN XL) 150 MG 24 hr tablet TAKE 3 TABLETS BY MOUTH EVERY MORNING 90 tablet 0  . cholecalciferol (VITAMIN D) 1000 UNITS tablet Take 1,000 Units by mouth daily.    . fluticasone (FLONASE) 50 MCG/ACT nasal spray Place 2 sprays into both nostrils daily. 48 g 3  . MIMVEY 1-0.5 MG tablet Take 1 tablet by mouth daily.    . pantoprazole (PROTONIX) 40 MG tablet TAKE 1 TABLET (40 MG TOTAL) BY MOUTH 2 (TWO) TIMES DAILY. 180 tablet 1  . zinc gluconate 50 MG tablet Take 50 mg by mouth daily.     No current facility-administered medications on file prior to visit.      Objective:  Objective  Physical Exam  Constitutional: She is oriented to person, place, and time. She appears well-developed and well-nourished.  HENT:  Head: Normocephalic and atraumatic.  Eyes: Conjunctivae and EOM are normal.  Neck: Normal range of motion. Neck supple. No JVD present. Carotid bruit is not present. No thyromegaly present.  Cardiovascular: Normal rate, regular rhythm and normal heart sounds.   No murmur heard. Pulmonary/Chest: Effort normal and breath sounds normal. No respiratory distress. She has no wheezes. She has no rales. She exhibits no tenderness.  Musculoskeletal: She exhibits no edema.  Neurological: She is alert and oriented to person, place, and time.  Skin: There is erythema.     Psychiatric: She has a normal mood and affect.  Nursing note and vitals reviewed.  BP 130/86   Pulse 91   Wt 195 lb (88.5 kg)   SpO2 97%   BMI  32.45 kg/m  Wt Readings from Last 3 Encounters:  10/29/16 195 lb (88.5 kg)  02/16/16 211 lb 3.2 oz (95.8 kg)  05/06/15 200 lb 2 oz (90.8 kg)     Lab Results  Component Value Date   WBC 10.2 10/11/2014   HGB 14.0 10/11/2014   HCT 42.5 10/11/2014   PLT 307 10/11/2014   GLUCOSE 100 (H) 10/11/2014   CHOL 185 10/12/2014   TRIG 255 (H) 10/12/2014   HDL 58 10/12/2014   LDLCALC 76 10/12/2014   ALT 13 (L) 10/11/2014   AST 18 10/11/2014   NA 139 10/11/2014    K 3.7 10/11/2014   CL 104 10/11/2014   CREATININE 0.64 10/11/2014   BUN 12 10/11/2014   CO2 25 10/11/2014   TSH 1.402 01/08/2014   HGBA1C 5.2 10/12/2014    Mm Digital Diagnostic Unilat R  Result Date: 06/12/2016 CLINICAL DATA:  Patient was called back from screening mammogram for right breast calcifications. EXAM: DIGITAL DIAGNOSTIC RIGHT MAMMOGRAM WITH CAD COMPARISON:  Previous exam(s). ACR Breast Density Category c: The breast tissue is heterogeneously dense, which may obscure small masses. FINDINGS: Magnification views of the upper-outer quadrant of the right breast were performed. There are scattered subcentimeter grouped calcifications in the breast. They have a differential appearance consistent with milk of calcium. No suspicious calcifications identified. Mammographic images were processed with CAD. IMPRESSION: No evidence of malignancy in the right breast. RECOMMENDATION: Bilateral screening mammogram in 1 year is recommended. I have discussed the findings and recommendations with the patient. Results were also provided in writing at the conclusion of the visit. If applicable, a reminder letter will be sent to the patient regarding the next appointment. BI-RADS CATEGORY  2: Benign. Electronically Signed   By: Lillia Mountain M.D.   On: 06/12/2016 09:12     Assessment & Plan:  Plan  I have discontinued Ms. Wahler's cetirizine, fluconazole, levofloxacin, and Levocetirizine Dihydrochloride (XYZAL PO). I am also having her start on ranitidine, levocetirizine, and fexofenadine. Additionally, I am having her maintain her cholecalciferol, vitamin C, zinc gluconate, MIMVEY, amoxicillin-clavulanate, pantoprazole, fluticasone, buPROPion, and Fexofenadine HCl (ALLEGRA PO).  Meds ordered this encounter  Medications  . DISCONTD: Levocetirizine Dihydrochloride (XYZAL PO)    Sig: Take by mouth at bedtime.  Marland Kitchen Fexofenadine HCl (ALLEGRA PO)    Sig: Take by mouth every morning.  . ranitidine (ZANTAC) 150  MG tablet    Sig: Take 1 tablet (150 mg total) by mouth 2 (two) times daily.    Dispense:  60 tablet    Refill:  5  . levocetirizine (XYZAL) 5 MG tablet    Sig: Take 1 tablet (5 mg total) by mouth every evening.    Dispense:  30 tablet    Refill:  5  . fexofenadine (ALLEGRA ALLERGY) 180 MG tablet    Sig: Take 1 tablet (180 mg total) by mouth daily.    Problem List Items Addressed This Visit      Unprioritized   Allergic reaction - Primary    con't allegra and xyzal per derm Add zantac bid ? If reaction to hormones-- check with gyn Call us if no better      Relevant Medications   ranitidine (ZANTAC) 150 MG tablet   levocetirizine (XYZAL) 5 MG tablet   fexofenadine (ALLEGRA ALLERGY) 180 MG tablet      Follow-up: Return if symptoms worsen or fail to improve.  Ann Held, DO

## 2016-10-29 NOTE — Patient Instructions (Signed)
Drug Allergy A drug allergy is when your body reacts in a bad way to a medicine. This can be life-threatening. If you have an allergic reaction, get help right away, even if the reaction seems gentle (mild). Your doctor may teach you how to use an allergy kit (anaphylaxis kit) and how to give yourself an allergy shot (epinephrine injection). You can give yourself an allergy shot with what is commonly called an auto-injector "pen." Symptoms of a Gentle Reaction  A stuffy nose (nasal congestion).  Tingling in your mouth.  An itchy, red rash. Symptoms of a Very Bad Reaction  Swelling of your eyes, lips, face, or tongue.  Swelling of the back of your mouth and your throat.  Breathing loudly (wheezing).  A hoarse voice.  Itchy, red, swollen areas of skin (hives).  Dizziness or light-headedness.  Passing out (fainting).  Feeling worried or nervous (anxiety).  Feeling confused.  Pain in your belly (abdomen).  Trouble with breathing, talking, or swallowing.  A tight feeling in your chest.  Fast or uneven heartbeats (palpitations).  Throwing up (vomiting).  Watery poop (diarrhea). Follow these instructions at home: If You Have a Very Bad Allergy:  Always keep an auto-injector pen or your allergy kit with you. These could save your life. Use them as told by your doctor.  Make sure that you, the people who live with you, and your employer know: ? How to use your allergy kit. ? How to use an auto-injector pen to give you an allergy shot.  If you used your auto-injector pen: ? Get more medicine for it right away. This is important in case you have another reaction. ? Get help right away.  Wear a bracelet or necklace that says you have an allergy, if your doctor tells you to do this. General instructions  Avoid medicines that you are allergic to.  Take over-the-counter and prescription medicines only as told by your doctor.  Do not drive until your doctor says it is  safe.  If you have itchy, red, swollen areas of skin or a rash: ? Use over-the-counter medicine (antihistamine) as told by your doctor. ? Put cold, wet cloths (cold compresses) on your skin. ? Take baths or showers in cool water. Avoid hot water.  If you had tests done, it is your responsibility to get your test results. Ask your doctor when your results will be ready.  Tell any doctors who care for you that you have a drug allergy.  Keep all follow-up visits as told by your doctor. This is important. Contact a doctor if:  You start to have any of these: ? A stuffy nose. ? Tingling in your mouth. ? An itchy, red rash.  You have symptoms that last more than 2 days after your reaction.  Your symptoms get worse.  You get new symptoms. Get help right away if:  You had to use your auto-injector pen. You must go to the emergency room even if the medicine seems to be working.  You have any of these: ? Swelling in your eyes, lips, face, or tongue. ? Swelling in the back of your mouth or your throat. ? Loud breathing. ? A hoarse voice. ? Itchy, red, swollen areas of skin. ? Trouble with breathing, talking, or swallowing. ? A tight feeling in your chest. ? A fast heartbeat.  You have throwing up that gets very bad.  You have watery poop that gets very bad.  You feel dizzy or light-headed.  You   pass out. These symptoms may be an emergency. Do not wait to see if the symptoms will go away. Use your auto-injector pen or allergy kit as you have been told. Get medical help right away. Call your local emergency services (911 in the [place] Do not drive yourself to the hospital. This information is not intended to replace advice given to you by your health care provider. Make sure you discuss any questions you have with your health care provider. Document Released: 03/01/2004 Document Revised: 06/30/2015 Document Reviewed: 08/24/2014 Elsevier Interactive Patient Education  2018 Elsevier  Inc.  

## 2016-11-13 ENCOUNTER — Other Ambulatory Visit: Payer: Self-pay | Admitting: Family Medicine

## 2016-11-13 DIAGNOSIS — F329 Major depressive disorder, single episode, unspecified: Secondary | ICD-10-CM

## 2016-11-13 DIAGNOSIS — F32A Depression, unspecified: Secondary | ICD-10-CM

## 2016-11-15 NOTE — Telephone Encounter (Signed)
Patient called to check on status of refill, she has enough today and tomorrow. Please advise

## 2016-12-12 ENCOUNTER — Telehealth: Payer: Self-pay | Admitting: Family Medicine

## 2016-12-12 NOTE — Telephone Encounter (Signed)
Pt called in because she said that PCP only prescribed a 30 day supply of buPROPion. Pt isn't sure why. She said that she need a refill. Please assist further.    CB: [phone]   Pharmacy: [id number] - [place], [place]

## 2016-12-13 ENCOUNTER — Other Ambulatory Visit: Payer: Self-pay | Admitting: Family Medicine

## 2016-12-13 DIAGNOSIS — F32A Depression, unspecified: Secondary | ICD-10-CM

## 2016-12-13 DIAGNOSIS — F329 Major depressive disorder, single episode, unspecified: Secondary | ICD-10-CM

## 2016-12-13 NOTE — Telephone Encounter (Signed)
Pt is overdue for her routine 6 month follow-up w/ PCP. She saw PCP in 10/2016 for an acute visit. Further refills will be given at appt.

## 2016-12-14 NOTE — Telephone Encounter (Signed)
LVM for pt to return call to schedule apt.

## 2016-12-14 NOTE — Telephone Encounter (Signed)
Pt returned call. Pt has been scheduled.

## 2016-12-31 ENCOUNTER — Ambulatory Visit (INDEPENDENT_AMBULATORY_CARE_PROVIDER_SITE_OTHER): Payer: PRIVATE HEALTH INSURANCE | Admitting: Family Medicine

## 2016-12-31 ENCOUNTER — Encounter: Payer: Self-pay | Admitting: Family Medicine

## 2016-12-31 VITALS — BP 138/88 | HR 94 | Temp 98.3°F | Resp 16 | Ht 65.0 in | Wt 197.4 lb

## 2016-12-31 DIAGNOSIS — L719 Rosacea, unspecified: Secondary | ICD-10-CM | POA: Diagnosis not present

## 2016-12-31 DIAGNOSIS — F329 Major depressive disorder, single episode, unspecified: Secondary | ICD-10-CM | POA: Diagnosis not present

## 2016-12-31 DIAGNOSIS — F32A Depression, unspecified: Secondary | ICD-10-CM | POA: Insufficient documentation

## 2016-12-31 MED ORDER — BUPROPION HCL ER (XL) 150 MG PO TB24: 450.0000 mg | ORAL_TABLET | Freq: Every morning | ORAL | 3 refills | Status: DC

## 2016-12-31 MED ORDER — METRONIDAZOLE 0.75 % EX CREA: TOPICAL_CREAM | Freq: Two times a day (BID) | CUTANEOUS | 0 refills | Status: DC

## 2016-12-31 NOTE — Progress Notes (Signed)
Patient ID: Vicki Le, female   DOB: 12/10/1965, 51 y.o.   MRN: [id number]     Subjective:  I acted as a Education administrator for Dr. Carollee Herter.  Guerry Bruin, [place]   Patient ID: Vicki Le, female    DOB: 1965/07/06, 51 y.o.   MRN: [id number]  Chief Complaint  Patient presents with  . Depression    follow up    HPI  Patient is in today for depression follow up.  She has been doing well on current treatment. Her face is doing better too but still gets red when she gets out of shower or goes out in cold.      Depression screen [facility] 2/9 12/31/2016 02/16/2016  Decreased Interest 0 0  Down, Depressed, Hopeless 0 0  PHQ - 2 Score 0 0  Altered sleeping 0 -  Tired, decreased energy 0 -  Change in appetite 0 -  Feeling bad or failure about yourself  0 -  Moving slowly or fidgety/restless 0 -  PHQ-9 Score 0 -   Patient Care Team: Ann Held, DO as PCP - General (Family Medicine) Vanessa Kick, MD as Consulting Physician (Obstetrics and Gynecology)   Past Medical History:  Diagnosis Date  . Anxiety   . Chicken pox   . Depression   . GERD (gastroesophageal reflux disease)   . Melanoma ([place])    left buttock    Past Surgical History:  Procedure Laterality Date  . MELANOMA EXCISION  2013  . skin cancer removal  August 14, 2011   2 lymph nodes removed as well  . TONSILECTOMY/ADENOIDECTOMY WITH MYRINGOTOMY    . WRIST SURGERY  1991/1192   ganglion cyst r wrist    Family History  Problem Relation Age of Onset  . Alzheimer's disease Father   . Colon cancer Father   . Breast cancer Unknown        on mother and father side  . Depression Unknown   . Hypertension Unknown     Social History   Socioeconomic History  . Marital status: Married    Spouse name: Not on file  . Number of children: Not on file  . Years of education: Not on file  . Highest education level: Not on file  Social Needs  . Financial resource strain: Not on file  . Food insecurity - worry: Not on  file  . Food insecurity - inability: Not on file  . Transportation needs - medical: Not on file  . Transportation needs - non-medical: Not on file  Occupational History  . Not on file  Tobacco Use  . Smoking status: Never Smoker  . Smokeless tobacco: Never Used  Substance and Sexual Activity  . Alcohol use: Yes    Comment: occassionally  . Drug use: No  . Sexual activity: Not on file  Other Topics Concern  . Not on file  Social History Narrative  . Not on file    Outpatient Medications Prior to Visit  Medication Sig Dispense Refill  . Ascorbic Acid (VITAMIN C) 1000 MG tablet Take 1,000 mg by mouth daily.    . cholecalciferol (VITAMIN D) 1000 UNITS tablet Take 1,000 Units by mouth daily.    . fexofenadine (ALLEGRA ALLERGY) 180 MG tablet Take 1 tablet (180 mg total) by mouth daily.    . fluticasone (FLONASE) 50 MCG/ACT nasal spray Place 2 sprays into both nostrils daily. 48 g 3  . levocetirizine (XYZAL) 5 MG tablet Take 1 tablet (5 mg total)  by mouth every evening. 30 tablet 5  . MIMVEY 1-0.5 MG tablet Take 1 tablet by mouth daily.    . pantoprazole (PROTONIX) 40 MG tablet TAKE 1 TABLET (40 MG TOTAL) BY MOUTH 2 (TWO) TIMES DAILY. 180 tablet 1  . ranitidine (ZANTAC) 150 MG tablet Take 1 tablet (150 mg total) by mouth 2 (two) times daily. 60 tablet 5  . zinc gluconate 50 MG tablet Take 50 mg by mouth daily.    Marland Kitchen buPROPion (WELLBUTRIN XL) 150 MG 24 hr tablet Take 3 tablets (450 mg total) every morning by mouth. 90 tablet 0  . amoxicillin-clavulanate (AUGMENTIN) 875-125 MG tablet Take 1 tablet by mouth 2 (two) times daily. 20 tablet 0  . Fexofenadine HCl (ALLEGRA PO) Take by mouth every morning.     No facility-administered medications prior to visit.     Allergies  Allergen Reactions  . Vicodin [Hydrocodone-Acetaminophen] Nausea And Vomiting    Review of Systems  Constitutional: Negative for fever and malaise/fatigue.  HENT: Negative for congestion.   Eyes: Negative for  blurred vision.  Respiratory: Negative for cough and shortness of breath.   Cardiovascular: Negative for chest pain, palpitations and leg swelling.  Gastrointestinal: Negative for vomiting.  Musculoskeletal: Negative for back pain.  Skin: Negative for rash.  Neurological: Negative for loss of consciousness and headaches.       Objective:    Physical Exam  Constitutional: She is oriented to person, place, and time. She appears well-developed and well-nourished.  HENT:  Head: Normocephalic and atraumatic.    Eyes: Conjunctivae and EOM are normal.  Neck: Normal range of motion. Neck supple. No JVD present. Carotid bruit is not present. No thyromegaly present.  Cardiovascular: Normal rate, regular rhythm and normal heart sounds.  No murmur heard. Pulmonary/Chest: Effort normal and breath sounds normal. No respiratory distress. She has no wheezes. She has no rales. She exhibits no tenderness.  Musculoskeletal: She exhibits no edema.  Neurological: She is alert and oriented to person, place, and time.  Skin: There is erythema.  Psychiatric: She has a normal mood and affect.  Nursing note and vitals reviewed.   BP 138/88 (BP Location: Left Arm, Cuff Size: Normal)   Pulse 94   Temp 98.3 F (36.8 C) (Oral)   Resp 16   Ht 5\' 5"  (1.651 m)   Wt 197 lb 6.4 oz (89.5 kg)   SpO2 98%   BMI 32.85 kg/m  Wt Readings from Last 3 Encounters:  12/31/16 197 lb 6.4 oz (89.5 kg)  10/29/16 195 lb (88.5 kg)  02/16/16 211 lb 3.2 oz (95.8 kg)   BP Readings from Last 3 Encounters:  12/31/16 138/88  10/29/16 130/86  02/16/16 136/86     Immunization History  Administered Date(s) Administered  . Tdap 10/03/2014    Health Maintenance  Topic Date Due  . HIV Screening  11/21/1979  . COLONOSCOPY  11/21/2014  . INFLUENZA VACCINE  05/27/2027 (Originally 09/05/2016)  . PAP SMEAR  06/11/2017  . MAMMOGRAM  06/12/2017  . TETANUS/TDAP  10/02/2024    Lab Results  Component Value Date   WBC 10.2  10/11/2014   HGB 14.0 10/11/2014   HCT 42.5 10/11/2014   PLT 307 10/11/2014   GLUCOSE 100 (H) 10/11/2014   CHOL 185 10/12/2014   TRIG 255 (H) 10/12/2014   HDL 58 10/12/2014   LDLCALC 76 10/12/2014   ALT 13 (L) 10/11/2014   AST 18 10/11/2014   NA 139 10/11/2014   K 3.7 10/11/2014  CL 104 10/11/2014   CREATININE 0.64 10/11/2014   BUN 12 10/11/2014   CO2 25 10/11/2014   TSH 1.402 01/08/2014   HGBA1C 5.2 10/12/2014    Lab Results  Component Value Date   TSH 1.402 01/08/2014   Lab Results  Component Value Date   WBC 10.2 10/11/2014   HGB 14.0 10/11/2014   HCT 42.5 10/11/2014   MCV 95.3 10/11/2014   PLT 307 10/11/2014   Lab Results  Component Value Date   NA 139 10/11/2014   K 3.7 10/11/2014   CO2 25 10/11/2014   GLUCOSE 100 (H) 10/11/2014   BUN 12 10/11/2014   CREATININE 0.64 10/11/2014   BILITOT 0.5 10/11/2014   ALKPHOS 41 10/11/2014   AST 18 10/11/2014   ALT 13 (L) 10/11/2014   PROT 6.8 10/11/2014   ALBUMIN 3.9 10/11/2014   CALCIUM 8.9 10/11/2014   ANIONGAP 10 10/11/2014   GFR 114.74 09/27/2014   Lab Results  Component Value Date   CHOL 185 10/12/2014   Lab Results  Component Value Date   HDL 58 10/12/2014   Lab Results  Component Value Date   LDLCALC 76 10/12/2014   Lab Results  Component Value Date   TRIG 255 (H) 10/12/2014   Lab Results  Component Value Date   CHOLHDL 3.2 10/12/2014   Lab Results  Component Value Date   HGBA1C 5.2 10/12/2014         Assessment & Plan:   Problem List Items Addressed This Visit      Unprioritized   Depression    stable      Relevant Medications   buPROPion (WELLBUTRIN XL) 150 MG 24 hr tablet    Other Visit Diagnoses    Rosacea    -  Primary   Relevant Medications   metroNIDAZOLE (METROCREAM) 0.75 % cream      I have discontinued Mena Goes. Monsivais's amoxicillin-clavulanate and Fexofenadine HCl (ALLEGRA PO). I have also changed her buPROPion. Additionally, I am having her start on  metroNIDAZOLE. Lastly, I am having her maintain her cholecalciferol, vitamin C, zinc gluconate, MIMVEY, pantoprazole, fluticasone, ranitidine, levocetirizine, and fexofenadine.  Meds ordered this encounter  Medications  . buPROPion (WELLBUTRIN XL) 150 MG 24 hr tablet    Sig: Take 3 tablets (450 mg total) by mouth every morning.    Dispense:  270 tablet    Refill:  3    Requested drug refills are authorized, however, the patient needs further evaluation and/or laboratory testing before further refills are given. Ask her to make an appointment for this.  . metroNIDAZOLE (METROCREAM) 0.75 % cream    Sig: Apply topically 2 (two) times daily.    Dispense:  45 g    Refill:  0    CMA served as scribe during this visit. History, Physical and Plan performed by medical provider. Documentation and orders reviewed and attested to.  Ann Held, DO

## 2016-12-31 NOTE — Assessment & Plan Note (Signed)
stable °

## 2016-12-31 NOTE — Patient Instructions (Signed)
Rosacea Rosacea is a long-term (chronic) condition that affects the skin of the face, including the cheeks, nose, brow, and chin. This condition can also affect the eyes. Rosacea causes blood vessels near the surface of the skin to enlarge, which results in redness. What are the causes? The cause of this condition is not known. Certain triggers can make rosacea worse, including:  Hot baths.  Exercise.  Sunlight.  Very hot or cold temperatures.  Hot or spicy foods and drinks.  Drinking alcohol.  Stress.  Taking blood pressure medicine.  Long-term use of topical steroids on the face.  What increases the risk? This condition is more likely to develop in:  People who are older than 52 years of age.  Women.  People who have light-colored skin (light complexion).  People who have a family history of rosacea.  What are the signs or symptoms? Symptoms of this condition include:  Redness of the face.  Red bumps or pimples on the face.  A red, enlarged nose.  Blushing easily.  Red lines on the skin.  Irritated or burning feeling in the eyes.  Swollen eyelids.  How is this diagnosed? This condition is diagnosed with a medical history and physical exam. How is this treated? There is no cure for this condition, but treatment can help to control your symptoms. Your health care provider may recommend that you see a skin specialist (dermatologist). Treatment may include:  Antibiotic medicines that are applied to the skin or taken as a pill.  Laser treatment to improve the appearance of the skin.  Surgery. This is rare.  Your health care provider will also recommend the best way to take care of your skin. Even after your skin improves, you will likely need to continue treatment to prevent your rosacea from coming back. Follow these instructions at home: Skin Care Take care of your skin as told by your health care provider. You may be told to do these things:  Wash  your skin gently two or more times each day.  Use mild soap.  Use a sunscreen or sunblock with SPF 30 or greater.  Use gentle cosmetics that are meant for sensitive skin.  Shave with an electric shaver instead of a blade.  Lifestyle  Try to keep track of what foods trigger this condition. Avoid any triggers. These may include: ? Spicy foods. ? Seafood. ? Cheese. ? Hot liquids. ? Nuts. ? Chocolate. ? Iodized salt.  Do not drink alcohol.  Avoid extremely cold or hot temperatures.  Try to reduce your stress. If you need help, talk with your health care provider.  When you exercise, do these things to stay cool: ? Limit your sun exposure. ? Use a fan. ? Do shorter and more frequent intervals of exercise. General instructions  Keep all follow-up visits as told by your health care provider. This is important.  Take over-the-counter and prescription medicines only as told by your health care provider.  If your eyelids are affected, apply warm compresses to them. Do this as told by your health care provider.  If you were prescribed an antibiotic medicine, apply or take it as told by your health care provider. Do not stop using the antibiotic even if your condition improves. Contact a health care provider if:  Your symptoms get worse.  Your symptoms do not improve after two months of treatment.  You have new symptoms.  You have any changes in vision or you have problems with your eyes, such as   redness or itching.  You feel depressed.  You lose your appetite.  You have trouble concentrating. This information is not intended to replace advice given to you by your health care provider. Make sure you discuss any questions you have with your health care provider. Document Released: 03/01/2004 Document Revised: 06/30/2015 Document Reviewed: 03/31/2014 Elsevier Interactive Patient Education  2018 [employer] Inc.  

## 2017-01-07 ENCOUNTER — Telehealth: Payer: Self-pay | Admitting: Family Medicine

## 2017-01-07 DIAGNOSIS — K219 Gastro-esophageal reflux disease without esophagitis: Secondary | ICD-10-CM

## 2017-01-07 MED ORDER — PANTOPRAZOLE SODIUM 40 MG PO TBEC: DELAYED_RELEASE_TABLET | ORAL | 1 refills | Status: DC

## 2017-01-07 NOTE — Telephone Encounter (Signed)
Patient notified that rx was sent in  

## 2017-01-07 NOTE — Telephone Encounter (Signed)
Copied from [place]. Topic: Quick Communication - Rx Refill/Question >> Jan 07, 2017  2:02 PM Arletha Grippe wrote: Has the patient contacted their pharmacy? Yes.     (Agent: If no, request that the patient contact the pharmacy for the refill.)   Preferred Pharmacy (with phone number or street name): [facility] [address]. Pt needs new rx for pantoprazole (PROTONIX) 40 MG tablet. She would like 90 day supply.  Pt cb number is [phone]    Agent: Please be advised that RX refills may take up to 48 hours. We ask that you follow-up with your pharmacy.

## 2017-01-07 NOTE — Telephone Encounter (Signed)
Pt requesting a new reorder for Protonix was given a 6 mo supply 02/16/16

## 2017-01-21 ENCOUNTER — Other Ambulatory Visit: Payer: Self-pay | Admitting: Family Medicine

## 2017-03-02 ENCOUNTER — Other Ambulatory Visit: Payer: Self-pay | Admitting: Family Medicine

## 2017-03-02 DIAGNOSIS — T7840XA Allergy, unspecified, initial encounter: Secondary | ICD-10-CM

## 2017-04-05 ENCOUNTER — Encounter: Payer: Self-pay | Admitting: Family Medicine

## 2017-04-05 ENCOUNTER — Ambulatory Visit (INDEPENDENT_AMBULATORY_CARE_PROVIDER_SITE_OTHER): Payer: PRIVATE HEALTH INSURANCE | Admitting: Family Medicine

## 2017-04-05 VITALS — BP 112/80 | HR 102 | Temp 98.5°F | Ht 64.5 in | Wt 202.0 lb

## 2017-04-05 DIAGNOSIS — B9789 Other viral agents as the cause of diseases classified elsewhere: Secondary | ICD-10-CM

## 2017-04-05 DIAGNOSIS — J069 Acute upper respiratory infection, unspecified: Secondary | ICD-10-CM

## 2017-04-05 MED ORDER — PROMETHAZINE-CODEINE 6.25-10 MG/5ML PO SYRP: 5.0000 mL | ORAL_SOLUTION | Freq: Four times a day (QID) | ORAL | 0 refills | Status: DC | PRN

## 2017-04-05 NOTE — Progress Notes (Signed)
Chief Complaint  Patient presents with  . Sore Throat  . Cough    [name] here for URI complaints.  Duration: 2 days  Associated symptoms: ear pain, sore throat and cough Denies: sinus congestion, sinus pain, rhinorrhea, ear drainage, wheezing, shortness of breath, myalgia and fevers Treatment to date: Robitussin, guaifenesin, DM Sick contacts: Yes  ROS:  Const: Denies fevers HEENT: As noted in HPI Lungs: No SOB  Past Medical History:  Diagnosis Date  . Anxiety   . Chicken pox   . Depression   . GERD (gastroesophageal reflux disease)   . Melanoma ([place])    left buttock   Family History  Problem Relation Age of Onset  . Alzheimer's disease Father   . Colon cancer Father   . Breast cancer Unknown        on mother and father side  . Depression Unknown   . Hypertension Unknown     BP 112/80 (BP Location: Left Arm, Patient Position: Sitting, Cuff Size: Large)   Pulse (!) 102   Temp 98.5 F (36.9 C) (Oral)   Ht 5' 4.5" (1.638 m)   Wt 202 lb (91.6 kg)   SpO2 97%   BMI 34.14 kg/m  General: Awake, alert, appears stated age HEENT: AT, , ears patent b/l and TM's neg, nares patent w/o discharge, pharynx pink and without exudates, MMM Neck: No masses or asymmetry Heart: RRR Lungs: CTAB, no accessory muscle use Psych: Age appropriate judgment and insight, normal mood and affect  Viral URI with cough - Plan: promethazine-codeine (PHENERGAN WITH CODEINE) 6.25-10 MG/5ML syrup  Orders as above. Continue to push fluids, practice good hand hygiene, cover mouth when coughing. Do not drink alcohol, do any illicit/street drugs, drive or do anything that requires alertness while on this medicine.  F/u prn. If starting to experience fevers, shaking, or shortness of breath, seek immediate care. Pt voiced understanding and agreement to the plan.  [place], DO 04/05/17 1:40 PM

## 2017-04-05 NOTE — Progress Notes (Signed)
Pre visit review using our clinic review tool, if applicable. No additional management support is needed unless otherwise documented below in the visit note. 

## 2017-04-05 NOTE — Patient Instructions (Addendum)
Continue to push fluids, practice good hand hygiene, and cover your mouth if you cough.  If you start having fevers, shaking or shortness of breath, seek immediate care.  Mucinex (guaifenacin) may be helpful to break up secretions.   Do not drink alcohol, do any illicit/street drugs, drive or do anything that requires alertness while on this medicine.   Let us know if you need anything.

## 2017-04-08 ENCOUNTER — Telehealth: Payer: Self-pay | Admitting: Family Medicine

## 2017-04-08 MED ORDER — BENZONATATE 100 MG PO CAPS: 100.0000 mg | ORAL_CAPSULE | Freq: Three times a day (TID) | ORAL | 0 refills | Status: DC | PRN

## 2017-04-08 NOTE — Telephone Encounter (Signed)
Pt saw Dr. Nani Ravens on 04/05/2017- please advise.

## 2017-04-08 NOTE — Telephone Encounter (Signed)
I called another medicine for cough in that won't make her sleepy. Send MyChart message in 2-3 d if no better. TY.

## 2017-04-08 NOTE — Telephone Encounter (Signed)
Copied from [place]. Topic: Quick Communication - See Telephone Encounter >> Apr 08, 2017  8:59 AM Bea Graff, NT wrote: CRM for notification. See Telephone encounter for: Pt was seen on Friday and is not feeling any better. She states the cough medicine is just making her feel sleepy but not really helping. Would like to see if something else can be called in before making an appt to come back in to be seen. [facility] [address]   04/08/17.

## 2017-04-08 NOTE — Telephone Encounter (Signed)
Patient informed prescription sent in

## 2017-04-10 ENCOUNTER — Encounter: Payer: Self-pay | Admitting: Family Medicine

## 2017-06-07 ENCOUNTER — Other Ambulatory Visit: Payer: Self-pay

## 2017-06-07 DIAGNOSIS — T7840XA Allergy, unspecified, initial encounter: Secondary | ICD-10-CM

## 2017-06-07 MED ORDER — RANITIDINE HCL 150 MG PO TABS: 150.0000 mg | ORAL_TABLET | Freq: Two times a day (BID) | ORAL | 4 refills | Status: DC

## 2017-07-03 ENCOUNTER — Other Ambulatory Visit: Payer: Self-pay | Admitting: Family Medicine

## 2017-07-03 DIAGNOSIS — K219 Gastro-esophageal reflux disease without esophagitis: Secondary | ICD-10-CM

## 2017-07-28 ENCOUNTER — Telehealth: Payer: Self-pay | Admitting: Family Medicine

## 2017-08-01 MED ORDER — FLUTICASONE PROPIONATE 50 MCG/ACT NA SUSP: NASAL | 1 refills | Status: DC

## 2017-08-01 NOTE — Addendum Note (Signed)
Addended by: Kem Boroughs D on: 08/01/2017 02:58 PM   Modules accepted: Orders

## 2017-08-01 NOTE — Telephone Encounter (Addendum)
Patient stated the Fluticasone Propionate 50 MCG/ACT  prescription was sent in for one month, but she saves money when the prescription is written for 3 months. So she is asking for a new [facility] to [facility] [address].

## 2017-08-01 NOTE — Telephone Encounter (Signed)
Left message on machine that rx has been sent in. 

## 2018-01-01 ENCOUNTER — Other Ambulatory Visit: Payer: Self-pay | Admitting: Family Medicine

## 2018-01-01 ENCOUNTER — Other Ambulatory Visit: Payer: Self-pay

## 2018-01-01 DIAGNOSIS — K219 Gastro-esophageal reflux disease without esophagitis: Secondary | ICD-10-CM

## 2018-01-01 DIAGNOSIS — T7840XA Allergy, unspecified, initial encounter: Secondary | ICD-10-CM

## 2018-01-01 NOTE — Telephone Encounter (Signed)
Ranitidine rx request received via fax from [facility]. Routed to Dr. Etter Sjogren to review due to reported recall.

## 2018-01-03 MED ORDER — RANITIDINE HCL 150 MG PO TABS: 150.0000 mg | ORAL_TABLET | Freq: Two times a day (BID) | ORAL | 4 refills | Status: DC

## 2018-01-03 NOTE — Telephone Encounter (Signed)
Let pt now about recall---    Can use famotidine 40 mg 1 po qd #90 1 refill

## 2018-01-05 ENCOUNTER — Other Ambulatory Visit: Payer: Self-pay | Admitting: Family Medicine

## 2018-01-05 DIAGNOSIS — F32A Depression, unspecified: Secondary | ICD-10-CM

## 2018-01-05 DIAGNOSIS — F329 Major depressive disorder, single episode, unspecified: Secondary | ICD-10-CM

## 2018-01-06 ENCOUNTER — Encounter: Payer: Self-pay | Admitting: Family Medicine

## 2018-01-06 ENCOUNTER — Ambulatory Visit (INDEPENDENT_AMBULATORY_CARE_PROVIDER_SITE_OTHER): Payer: PRIVATE HEALTH INSURANCE | Admitting: Family Medicine

## 2018-01-06 VITALS — BP 133/82 | HR 95 | Temp 98.3°F | Resp 16 | Ht 64.5 in | Wt 214.0 lb

## 2018-01-06 DIAGNOSIS — R21 Rash and other nonspecific skin eruption: Secondary | ICD-10-CM

## 2018-01-06 DIAGNOSIS — F329 Major depressive disorder, single episode, unspecified: Secondary | ICD-10-CM

## 2018-01-06 DIAGNOSIS — F32A Depression, unspecified: Secondary | ICD-10-CM

## 2018-01-06 DIAGNOSIS — K219 Gastro-esophageal reflux disease without esophagitis: Secondary | ICD-10-CM

## 2018-01-06 DIAGNOSIS — E669 Obesity, unspecified: Secondary | ICD-10-CM

## 2018-01-06 MED ORDER — BUPROPION HCL ER (XL) 150 MG PO TB24: 450.0000 mg | ORAL_TABLET | Freq: Every morning | ORAL | 3 refills | Status: DC

## 2018-01-06 NOTE — Patient Instructions (Signed)
Binge-Eating Disorder Binge-eating disorder is an eating disorder that involves repeated episodes of binge eating. Binge eating refers to eating a larger-than-normal amount of food in a short period of time. People with binge-eating disorder feel unable to control their eating. Although they feel bad about overeating, they usually do not try to make up for overeating. They usually do not make themselves vomit, use laxatives, starve themselves, or exercise too much. Binge-eating disorder usually starts in the teenage years or early 20s. It often gets worse with stress. What are the causes? The cause is unknown. What increases the risk? Risk factors include:  Being a teenager or in your early 20s.  Being female. Binge-eating disorder can affect males, but it is more common in females.  Being overweight or obese.  Having a mental health disorder, such as depression or anxiety.  Having a substance use disorder, such as alcohol use disorder.  What are the signs or symptoms? Symptoms may include:  Eating much more quickly than normal.  Eating to the point of feeling physically uncomfortable.  Eating large amounts of food when you are not hungry.  Eating alone because you are embarrassed by how much you are eating.  Feeling disgusted, depressed, or guilty after overeating.  How is this diagnosed? The diagnosis of binge-eating disorder is made through an assessment by your health care provider. You may be diagnosed with the disorder if you:  Binge eat at least once a week on average for three months or longer.  Have three or more of the symptoms of the disorder.  Once you have been diagnosed, your level of binge-eating disorder will be rated from mild to severe. The rating is based on how often you binge eat. How is this treated? Treatment is usually provided by mental health professionals, such as psychologists, psychiatrists, licensed professional counselors, and clinical social  workers. The following treatment options are available:  Cognitive behavioral therapy. This is a form of talk therapy that helps you recognize the thoughts, beliefs, and emotions that contribute to overeating and helps you change them.  Interpersonal psychotherapy. This is a form of talk therapy that focuses on fixing relationship problems that trigger binge-eating episodes.  Dialectical behavioral therapy. This is a form of talk therapy that helps you learn skills to control your emotions and tolerate distress without binge eating.  Medicine.  Weight-loss programs. These can be important if you are overweight. Losing excess weight can improve physical health and the way you feel about yourself.  Follow these instructions at home:  Keep all follow-up visits as directed by your health care provider. This is important.  Take medicines only as directed by your health care provider. Where to find more information: National Eating Disorders Association (NEDA): www.nationaleatingdisorders.org Contact a health care provider if:  Your symptoms get worse.  You start having new symptoms.  You start compensating for eating binges with harmful behaviors, such as: ? Making yourself vomit. ? Exercising too much. ? Using laxatives. Get help right away if: You have serious thoughts about hurting yourself or someone else. This information is not intended to replace advice given to you by your health care provider. Make sure you discuss any questions you have with your health care provider. Document Released: 06/14/2010 Document Revised: 06/30/2015 Document Reviewed: 05/04/2013 Elsevier Interactive Patient Education  2018 Elsevier Inc.  

## 2018-01-06 NOTE — Assessment & Plan Note (Signed)
Ok to con't otc cream as long as its not cortisone cream

## 2018-01-06 NOTE — Assessment & Plan Note (Signed)
-   Refer to healthy weight and wellness 

## 2018-01-06 NOTE — Progress Notes (Signed)
Patient ID: Vicki Le, female    DOB: 10-21-1965  Age: 52 y.o. MRN: [id number]    Subjective:  Subjective  HPI [name] presents for f/u depression. She is doing well with the wellbutrin  She is asking about weight loss and is asking for help. She also needs refills of meds. The cream for rosacea did not help-- she found benadryl cream at the dollar store that helps.  No other complaints.    Review of Systems  Constitutional: Negative for appetite change, diaphoresis, fatigue and unexpected weight change.  Eyes: Negative for pain, redness and visual disturbance.  Respiratory: Negative for cough, chest tightness, shortness of breath and wheezing.   Cardiovascular: Negative for chest pain, palpitations and leg swelling.  Endocrine: Negative for cold intolerance, heat intolerance, polydipsia, polyphagia and polyuria.  Genitourinary: Negative for difficulty urinating, dysuria and frequency.  Skin: Negative for color change and rash.  Neurological: Negative for dizziness, light-headedness, numbness and headaches.    History Past Medical History:  Diagnosis Date  . Anxiety   . Chicken pox   . Depression   . GERD (gastroesophageal reflux disease)   . Melanoma ([place])    left buttock    She has a past surgical history that includes skin cancer removal (August 14, 2011); Tonsilectomy/adenoidectomy with myringotomy; Wrist surgery (1991/1192); and Melanoma excision (2013).   Her family history includes Alzheimer's disease in her father; Breast cancer in her unknown relative; Colon cancer in her father; Depression in her unknown relative; Hypertension in her unknown relative.She reports that she has never smoked. She has never used smokeless tobacco. She reports that she drinks alcohol. She reports that she does not use drugs.  Current Outpatient Medications on File Prior to Visit  Medication Sig Dispense Refill  . Ascorbic Acid (VITAMIN C) 1000 MG tablet Take 1,000 mg by mouth daily.     . benzonatate (TESSALON) 100 MG capsule Take 1 capsule (100 mg total) by mouth 3 (three) times daily as needed. 30 capsule 0  . cholecalciferol (VITAMIN D) 1000 UNITS tablet Take 1,000 Units by mouth daily.    . fexofenadine (ALLEGRA ALLERGY) 180 MG tablet Take 1 tablet (180 mg total) by mouth daily.    . fluticasone (FLONASE) 50 MCG/ACT nasal spray SPRAY 2 SPRAYS INTO EACH NOSTRIL EVERY DAY 48 g 1  . levocetirizine (XYZAL) 5 MG tablet Take 1 tablet (5 mg total) by mouth every evening. 30 tablet 5  . metroNIDAZOLE (METROCREAM) 0.75 % cream Apply topically 2 (two) times daily. 45 g 0  . MIMVEY 1-0.5 MG tablet Take 1 tablet by mouth daily.    . pantoprazole (PROTONIX) 40 MG tablet TAKE 1 TABLET BY MOUTH TWICE A DAY 180 tablet 1  . promethazine-codeine (PHENERGAN WITH CODEINE) 6.25-10 MG/5ML syrup Take 5 mLs by mouth every 6 (six) hours as needed for cough. 120 mL 0  . ranitidine (ZANTAC) 150 MG tablet Take 1 tablet (150 mg total) by mouth 2 (two) times daily. 60 tablet 4  . zinc gluconate 50 MG tablet Take 50 mg by mouth daily.     No current facility-administered medications on file prior to visit.      Objective:  Objective  Physical Exam  Constitutional: She is oriented to person, place, and time. She appears well-developed and well-nourished.  HENT:  Head: Normocephalic and atraumatic.  Eyes: Conjunctivae and EOM are normal.  Neck: Normal range of motion. Neck supple. No JVD present. Carotid bruit is not present. No thyromegaly present.  Cardiovascular: Normal rate, regular rhythm and normal heart sounds.  No murmur heard. Pulmonary/Chest: Effort normal and breath sounds normal. No respiratory distress. She has no wheezes. She has no rales. She exhibits no tenderness.  Musculoskeletal: She exhibits no edema.  Neurological: She is alert and oriented to person, place, and time.  Skin: No rash noted.  Psychiatric: She has a normal mood and affect. Her behavior is normal. Judgment and  thought content normal.  Nursing note and vitals reviewed.  BP 133/82 (BP Location: Left Arm, Cuff Size: Large)   Pulse 95   Temp 98.3 F (36.8 C) (Oral)   Resp 16   Ht 5' 4.5" (1.638 m)   Wt 214 lb (97.1 kg)   SpO2 100%   BMI 36.17 kg/m  Wt Readings from Last 3 Encounters:  01/06/18 214 lb (97.1 kg)  04/05/17 202 lb (91.6 kg)  12/31/16 197 lb 6.4 oz (89.5 kg)     Lab Results  Component Value Date   WBC 10.2 10/11/2014   HGB 14.0 10/11/2014   HCT 42.5 10/11/2014   PLT 307 10/11/2014   GLUCOSE 100 (H) 10/11/2014   CHOL 185 10/12/2014   TRIG 255 (H) 10/12/2014   HDL 58 10/12/2014   LDLCALC 76 10/12/2014   ALT 13 (L) 10/11/2014   AST 18 10/11/2014   NA 139 10/11/2014   K 3.7 10/11/2014   CL 104 10/11/2014   CREATININE 0.64 10/11/2014   BUN 12 10/11/2014   CO2 25 10/11/2014   TSH 1.402 01/08/2014   HGBA1C 5.2 10/12/2014    Mm Digital Diagnostic Unilat R  Result Date: 06/12/2016 CLINICAL DATA:  Patient was called back from screening mammogram for right breast calcifications. EXAM: DIGITAL DIAGNOSTIC RIGHT MAMMOGRAM WITH CAD COMPARISON:  Previous exam(s). ACR Breast Density Category c: The breast tissue is heterogeneously dense, which may obscure small masses. FINDINGS: Magnification views of the upper-outer quadrant of the right breast were performed. There are scattered subcentimeter grouped calcifications in the breast. They have a differential appearance consistent with milk of calcium. No suspicious calcifications identified. Mammographic images were processed with CAD. IMPRESSION: No evidence of malignancy in the right breast. RECOMMENDATION: Bilateral screening mammogram in 1 year is recommended. I have discussed the findings and recommendations with the patient. Results were also provided in writing at the conclusion of the visit. If applicable, a reminder letter will be sent to the patient regarding the next appointment. BI-RADS CATEGORY  2: Benign. Electronically  Signed   By: Lillia Mountain M.D.   On: 06/12/2016 09:12     Assessment & Plan:  Plan  I am having Gessica Jawad. Menard maintain her cholecalciferol, vitamin C, zinc gluconate, MIMVEY, levocetirizine, fexofenadine, metroNIDAZOLE, promethazine-codeine, benzonatate, fluticasone, pantoprazole, ranitidine, and buPROPion.  Meds ordered this encounter  Medications  . buPROPion (WELLBUTRIN XL) 150 MG 24 hr tablet    Sig: Take 3 tablets (450 mg total) by mouth every morning.    Dispense:  270 tablet    Refill:  3    Requested drug refills are authorized, however, the patient needs further evaluation and/or laboratory testing before further refills are given. Ask her to make an appointment for this.    Problem List Items Addressed This Visit      Unprioritized   Depression    Refill wellbutrin      Relevant Medications   buPROPion (WELLBUTRIN XL) 150 MG 24 hr tablet   GERD (gastroesophageal reflux disease) - Primary   Morbid obesity ([place])  Relevant Orders   Amb Ref to Medical Weight Management   Obesity (BMI 30-39.9)    Refer to healthy weight and wellness       Rash    Ok to con't otc cream as long as its not cortisone cream         Follow-up: Return in about 6 months (around 07/08/2018), or if symptoms worsen or fail to improve, for annual exam, fasting.  Ann Held, DO

## 2018-01-06 NOTE — Assessment & Plan Note (Signed)
Refill wellbutrin

## 2018-01-07 MED ORDER — FAMOTIDINE 40 MG PO TABS: 40.0000 mg | ORAL_TABLET | Freq: Every day | ORAL | 1 refills | Status: DC

## 2018-01-07 NOTE — Addendum Note (Signed)
Addended by: Raynelle Dick R on: 01/07/2018 02:21 PM   Modules accepted: Orders

## 2018-01-07 NOTE — Telephone Encounter (Signed)
Author phoned pt. to relay Dr. Nonda Lou message; pt. Verbalized understanding. Famotidine sent into preferred pharmacy per Dr. Etter Sjogren.

## 2018-01-15 ENCOUNTER — Other Ambulatory Visit: Payer: Self-pay | Admitting: Family Medicine

## 2018-04-07 ENCOUNTER — Telehealth: Payer: Self-pay

## 2018-04-07 ENCOUNTER — Encounter: Payer: Self-pay | Admitting: Family Medicine

## 2018-04-07 NOTE — Telephone Encounter (Signed)
PA initiated via Covermymeds; KEY: [id number]. Awaiting determination.

## 2018-04-07 NOTE — Telephone Encounter (Signed)
PA initiated via Covermymeds; KEY: [id number]. Awaiting determination.

## 2018-04-10 NOTE — Telephone Encounter (Signed)
PA cancelled by plan. Awaiting further information.  ?

## 2018-04-10 NOTE — Telephone Encounter (Signed)
PA approved.  PA Case: [id number], Status: Approved, Coverage Starts on: 04/07/2018 12:00:00 AM, Coverage Ends on: 07/08/2018 12:00:00 AM

## 2018-04-14 ENCOUNTER — Encounter: Payer: Self-pay | Admitting: Family Medicine

## 2018-04-14 ENCOUNTER — Ambulatory Visit (INDEPENDENT_AMBULATORY_CARE_PROVIDER_SITE_OTHER): Payer: PRIVATE HEALTH INSURANCE | Admitting: Family Medicine

## 2018-04-14 VITALS — BP 132/88 | HR 62 | Temp 98.6°F | Resp 12 | Ht 64.5 in | Wt 212.0 lb

## 2018-04-14 DIAGNOSIS — J020 Streptococcal pharyngitis: Secondary | ICD-10-CM

## 2018-04-14 DIAGNOSIS — R05 Cough: Secondary | ICD-10-CM

## 2018-04-14 DIAGNOSIS — K219 Gastro-esophageal reflux disease without esophagitis: Secondary | ICD-10-CM

## 2018-04-14 DIAGNOSIS — J029 Acute pharyngitis, unspecified: Secondary | ICD-10-CM

## 2018-04-14 DIAGNOSIS — R059 Cough, unspecified: Secondary | ICD-10-CM

## 2018-04-14 LAB — POCT INFLUENZA A/B
INFLUENZA A, POC: NEGATIVE
INFLUENZA B, POC: NEGATIVE

## 2018-04-14 LAB — POCT RAPID STREP A (OFFICE): Rapid Strep A Screen: POSITIVE — AB

## 2018-04-14 MED ORDER — FLUCONAZOLE 150 MG PO TABS: ORAL_TABLET | ORAL | 0 refills | Status: DC

## 2018-04-14 MED ORDER — PANTOPRAZOLE SODIUM 40 MG PO TBEC: 40.0000 mg | DELAYED_RELEASE_TABLET | Freq: Two times a day (BID) | ORAL | 1 refills | Status: DC

## 2018-04-14 MED ORDER — AMOXICILLIN-POT CLAVULANATE 875-125 MG PO TABS: 1.0000 | ORAL_TABLET | Freq: Two times a day (BID) | ORAL | 0 refills | Status: DC

## 2018-04-14 NOTE — Progress Notes (Signed)
Patient ID: Vicki Le, female    DOB: Dec 26, 1965  Age: 53 y.o. MRN: [id number]    Subjective:  Subjective  HPI [name] present s for 3 day hx sore throat  Body aches  + chills  + nasal congestion   Review of Systems  Constitutional: Negative for appetite change, diaphoresis, fatigue and unexpected weight change.  HENT: Positive for sinus pressure, sinus pain and sore throat.   Eyes: Negative for pain, redness and visual disturbance.  Respiratory: Positive for cough. Negative for chest tightness, shortness of breath and wheezing.   Cardiovascular: Negative for chest pain, palpitations and leg swelling.  Endocrine: Negative for cold intolerance, heat intolerance, polydipsia, polyphagia and polyuria.  Genitourinary: Negative for difficulty urinating, dysuria and frequency.  Neurological: Negative for dizziness, light-headedness, numbness and headaches.    History Past Medical History:  Diagnosis Date  . Anxiety   . Chicken pox   . Depression   . GERD (gastroesophageal reflux disease)   . Melanoma ([place])    left buttock    She has a past surgical history that includes skin cancer removal (August 14, 2011); Tonsilectomy/adenoidectomy with myringotomy; Wrist surgery (1991/1192); and Melanoma excision (2013).   Her family history includes Alzheimer's disease in her father; Breast cancer in her unknown relative; Colon cancer in her father; Depression in her unknown relative; Hypertension in her unknown relative.She reports that she has never smoked. She has never used smokeless tobacco. She reports current alcohol use. She reports that she does not use drugs.  Current Outpatient Medications on File Prior to Visit  Medication Sig Dispense Refill  . Ascorbic Acid (VITAMIN C) 1000 MG tablet Take 1,000 mg by mouth daily.    . benzonatate (TESSALON) 100 MG capsule Take 1 capsule (100 mg total) by mouth 3 (three) times daily as needed. 30 capsule 0  . buPROPion (WELLBUTRIN XL) 150 MG  24 hr tablet Take 3 tablets (450 mg total) by mouth every morning. 270 tablet 3  . cholecalciferol (VITAMIN D) 1000 UNITS tablet Take 1,000 Units by mouth daily.    . famotidine (PEPCID) 40 MG tablet Take 1 tablet (40 mg total) by mouth daily. 90 tablet 1  . fexofenadine (ALLEGRA ALLERGY) 180 MG tablet Take 1 tablet (180 mg total) by mouth daily.    . fluticasone (FLONASE) 50 MCG/ACT nasal spray SPRAY 2 SPRAYS INTO EACH NOSTRIL EVERY DAY 16 g 5  . levocetirizine (XYZAL) 5 MG tablet Take 1 tablet (5 mg total) by mouth every evening. 30 tablet 5  . metroNIDAZOLE (METROCREAM) 0.75 % cream Apply topically 2 (two) times daily. 45 g 0  . MIMVEY 1-0.5 MG tablet Take 1 tablet by mouth daily.    . promethazine-codeine (PHENERGAN WITH CODEINE) 6.25-10 MG/5ML syrup Take 5 mLs by mouth every 6 (six) hours as needed for cough. 120 mL 0  . zinc gluconate 50 MG tablet Take 50 mg by mouth daily.     No current facility-administered medications on file prior to visit.      Objective:  Objective  Physical Exam Vitals signs and nursing note reviewed.  Constitutional:      Appearance: She is well-developed.  HENT:     Right Ear: External ear normal.     Left Ear: External ear normal.     Nose: Congestion present.     Mouth/Throat:     Pharynx: Posterior oropharyngeal erythema present.  Eyes:     General:        Right eye:  No discharge.        Left eye: No discharge.     Conjunctiva/sclera: Conjunctivae normal.  Cardiovascular:     Rate and Rhythm: Normal rate and regular rhythm.     Heart sounds: Normal heart sounds. No murmur.  Pulmonary:     Effort: Pulmonary effort is normal. No respiratory distress.     Breath sounds: Normal breath sounds. No wheezing or rales.  Chest:     Chest wall: No tenderness.  Lymphadenopathy:     Cervical: Cervical adenopathy present.  Neurological:     Mental Status: She is alert and oriented to person, place, and time.    BP 132/88 (BP Location: Right Arm,  Patient Position: Sitting, Cuff Size: Normal)   Pulse 62   Temp 98.6 F (37 C)   Resp 12   Ht 5' 4.5" (1.638 m)   Wt 212 lb (96.2 kg)   SpO2 98%   BMI 35.83 kg/m  Wt Readings from Last 3 Encounters:  04/14/18 212 lb (96.2 kg)  01/06/18 214 lb (97.1 kg)  04/05/17 202 lb (91.6 kg)     Lab Results  Component Value Date   WBC 10.2 10/11/2014   HGB 14.0 10/11/2014   HCT 42.5 10/11/2014   PLT 307 10/11/2014   GLUCOSE 100 (H) 10/11/2014   CHOL 185 10/12/2014   TRIG 255 (H) 10/12/2014   HDL 58 10/12/2014   LDLCALC 76 10/12/2014   ALT 13 (L) 10/11/2014   AST 18 10/11/2014   NA 139 10/11/2014   K 3.7 10/11/2014   CL 104 10/11/2014   CREATININE 0.64 10/11/2014   BUN 12 10/11/2014   CO2 25 10/11/2014   TSH 1.402 01/08/2014   HGBA1C 5.2 10/12/2014    Mm Digital Diagnostic Unilat R  Result Date: 06/12/2016 CLINICAL DATA:  Patient was called back from screening mammogram for right breast calcifications. EXAM: DIGITAL DIAGNOSTIC RIGHT MAMMOGRAM WITH CAD COMPARISON:  Previous exam(s). ACR Breast Density Category c: The breast tissue is heterogeneously dense, which may obscure small masses. FINDINGS: Magnification views of the upper-outer quadrant of the right breast were performed. There are scattered subcentimeter grouped calcifications in the breast. They have a differential appearance consistent with milk of calcium. No suspicious calcifications identified. Mammographic images were processed with CAD. IMPRESSION: No evidence of malignancy in the right breast. RECOMMENDATION: Bilateral screening mammogram in 1 year is recommended. I have discussed the findings and recommendations with the patient. Results were also provided in writing at the conclusion of the visit. If applicable, a reminder letter will be sent to the patient regarding the next appointment. BI-RADS CATEGORY  2: Benign. Electronically Signed   By: Lillia Mountain M.D.   On: 06/12/2016 09:12     Assessment & Plan:  Plan  I  have changed Mena Goes. Ostrander's pantoprazole. I am also having her start on amoxicillin-clavulanate and fluconazole. Additionally, I am having her maintain her cholecalciferol, vitamin C, zinc gluconate, [place], levocetirizine, fexofenadine, metroNIDAZOLE, promethazine-codeine, benzonatate, buPROPion, famotidine, and fluticasone.  Meds ordered this encounter  Medications  . amoxicillin-clavulanate (AUGMENTIN) 875-125 MG tablet    Sig: Take 1 tablet by mouth 2 (two) times daily.    Dispense:  20 tablet    Refill:  0  . fluconazole (DIFLUCAN) 150 MG tablet    Sig: 1 po x1, may repeat in 3 days prn    Dispense:  2 tablet    Refill:  0  . pantoprazole (PROTONIX) 40 MG tablet    Sig:  Take 1 tablet (40 mg total) by mouth 2 (two) times daily.    Dispense:  180 tablet    Refill:  1    Problem List Items Addressed This Visit      Unprioritized   GERD (gastroesophageal reflux disease)   Relevant Medications   pantoprazole (PROTONIX) 40 MG tablet    Other Visit Diagnoses    Strep throat    -  Primary   Relevant Medications   amoxicillin-clavulanate (AUGMENTIN) 875-125 MG tablet   fluconazole (DIFLUCAN) 150 MG tablet   Coughing       Relevant Orders   POCT Influenza A/B   Sore throat       Relevant Medications   amoxicillin-clavulanate (AUGMENTIN) 875-125 MG tablet   fluconazole (DIFLUCAN) 150 MG tablet   Other Relevant Orders   POCT rapid strep A      Follow-up: Return if symptoms worsen or fail to improve.  Ann Held, DO

## 2018-04-14 NOTE — Patient Instructions (Signed)
Strep Throat    Strep throat is a bacterial infection of the throat. Your health care provider may call the infection tonsillitis or pharyngitis, depending on whether there is swelling in the tonsils or at the back of the throat. Strep throat is most common during the cold months of the year in children who are 5-54 years of age, but it can happen during any season in people of any age. This infection is spread from person to person (contagious) through coughing, sneezing, or close contact.  What are the causes?  Strep throat is caused by the bacteria called Streptococcus pyogenes.  What increases the risk?  This condition is more likely to develop in:  · People who spend time in crowded places where the infection can spread easily.  · People who have close contact with someone who has strep throat.  What are the signs or symptoms?  Symptoms of this condition include:  · Fever or chills.  · Redness, swelling, or pain in the tonsils or throat.  · Pain or difficulty when swallowing.  · White or yellow spots on the tonsils or throat.  · Swollen, tender glands in the neck or under the jaw.  · Red rash all over the body (rare).  How is this diagnosed?  This condition is diagnosed by performing a rapid strep test or by taking a swab of your throat (throat culture test). Results from a rapid strep test are usually ready in a few minutes, but throat culture test results are available after one or two days.  How is this treated?  This condition is treated with antibiotic medicine.  Follow these instructions at home:  Medicines  · Take over-the-counter and prescription medicines only as told by your health care provider.  · Take your antibiotic as told by your health care provider. Do not stop taking the antibiotic even if you start to feel better.  · Have family members who also have a sore throat or fever tested for strep throat. They may need antibiotics if they have the strep infection.  Eating and drinking  · Do not  share food, drinking cups, or personal items that could cause the infection to spread to other people.  · If swallowing is difficult, try eating soft foods until your sore throat feels better.  · Drink enough fluid to keep your urine clear or pale yellow.  General instructions  · Gargle with a salt-water mixture 3-4 times per day or as needed. To make a salt-water mixture, completely dissolve ½-1 tsp of salt in 1 cup of warm water.  · Make sure that all household members wash their hands well.  · Get plenty of rest.  · Stay home from school or work until you have been taking antibiotics for 24 hours.  · Keep all follow-up visits as told by your health care provider. This is important.  Contact a health care provider if:  · The glands in your neck continue to get bigger.  · You develop a rash, cough, or earache.  · You cough up a thick liquid that is green, yellow-brown, or bloody.  · You have pain or discomfort that does not get better with medicine.  · Your problems seem to be getting worse rather than better.  · You have a fever.  Get help right away if:  · You have new symptoms, such as vomiting, severe headache, stiff or painful neck, chest pain, or shortness of breath.  · You have severe throat   pain, drooling, or changes in your voice.  · You have swelling of the neck, or the skin on the neck becomes red and tender.  · You have signs of dehydration, such as fatigue, dry mouth, and decreased urination.  · You become increasingly sleepy, or you cannot wake up completely.  · Your joints become red or painful.  This information is not intended to replace advice given to you by your health care provider. Make sure you discuss any questions you have with your health care provider.  Document Released: 01/20/2000 Document Revised: 09/21/2015 Document Reviewed: 05/17/2014  Elsevier Interactive Patient Education © 2019 Elsevier Inc.

## 2018-05-14 ENCOUNTER — Telehealth: Payer: Self-pay | Admitting: Nurse Practitioner

## 2018-05-14 DIAGNOSIS — J069 Acute upper respiratory infection, unspecified: Secondary | ICD-10-CM

## 2018-05-14 MED ORDER — FLUTICASONE PROPIONATE 50 MCG/ACT NA SUSP: 2.0000 | Freq: Every day | NASAL | 5 refills | Status: DC

## 2018-05-14 MED ORDER — BENZONATATE 100 MG PO CAPS: 100.0000 mg | ORAL_CAPSULE | Freq: Three times a day (TID) | ORAL | 0 refills | Status: DC | PRN

## 2018-05-14 NOTE — Progress Notes (Signed)
We are sorry you are not feeling well.  Here is how we plan to help!  Based on what you have shared with me, it looks like you may have a viral upper respiratory infection.  Upper respiratory infections are caused by a large number of viruses; however, rhinovirus is the most common cause.   Symptoms vary from person to person, with common symptoms including sore throat, cough, and fatigue or lack of energy.  A low-grade fever of up to 100.4 may present, but is often uncommon.  Symptoms vary however, and are closely related to a person's age or underlying illnesses.  The most common symptoms associated with an upper respiratory infection are nasal discharge or congestion, cough, sneezing, headache and pressure in the ears and face.  These symptoms usually persist for about 3 to 10 days, but can last up to 2 weeks.  It is important to know that upper respiratory infections do not cause serious illness or complications in most cases.    Upper respiratory infections can be transmitted from person to person, with the most common method of transmission being a person's hands.  The virus is able to live on the skin and can infect other persons for up to 2 hours after direct contact.  Also, these can be transmitted when someone coughs or sneezes; thus, it is important to cover the mouth to reduce this risk.  To keep the spread of the illness at bay, good hand hygiene is very important.  This is an infection that is most likely caused by a virus. There are no specific treatments other than to help you with the symptoms until the infection runs its course.  We are sorry you are not feeling well.  Here is how we plan to help!   For nasal congestion, you may use an oral decongestants such as Mucinex D or if you have glaucoma or high blood pressure use plain Mucinex.  Saline nasal spray or nasal drops can help and can safely be used as often as needed for congestion.  For your congestion, I have prescribed Fluticasone  nasal spray one spray in each nostril twice a day  If you do not have a history of heart disease, hypertension, diabetes or thyroid disease, prostate/bladder issues or glaucoma, you may also use Sudafed to treat nasal congestion.  It is highly recommended that you consult with a pharmacist or your primary care physician to ensure this medication is safe for you to take.     If you have a cough, you may use cough suppressants such as Delsym and Robitussin.  If you have glaucoma or high blood pressure, you can also use Coricidin HBP.   For cough I have prescribed for you A prescription cough medication called Tessalon Perles 100 mg. You may take 1-2 capsules every 8 hours as needed for cough  If you have a sore or scratchy throat, use a saltwater gargle-  to  teaspoon of salt dissolved in a 4-ounce to 8-ounce glass of warm water.  Gargle the solution for approximately 15-30 seconds and then spit.  It is important not to swallow the solution.  You can also use throat lozenges/cough drops and Chloraseptic spray to help with throat pain or discomfort.  Warm or cold liquids can also be helpful in relieving throat pain.  For headache, pain or general discomfort, you can use Ibuprofen or Tylenol as directed.   Some authorities believe that zinc sprays or the use of Echinacea may shorten the   course of your symptoms.   HOME CARE . Only take medications as instructed by your medical team. . Be sure to drink plenty of fluids. Water is fine as well as fruit juices, sodas and electrolyte beverages. You may want to stay away from caffeine or alcohol. If you are nauseated, try taking small sips of liquids. How do you know if you are getting enough fluid? Your urine should be a pale yellow or almost colorless. . Get rest. . Taking a steamy shower or using a humidifier may help nasal congestion and ease sore throat pain. You can place a towel over your head and breathe in the steam from hot water coming from a  faucet. . Using a saline nasal spray works much the same way. . Cough drops, hard candies and sore throat lozenges may ease your cough. . Avoid close contacts especially the very young and the elderly . Cover your mouth if you cough or sneeze . Always remember to wash your hands.   GET HELP RIGHT AWAY IF: . You develop worsening fever. . If your symptoms do not improve within 10 days . You develop yellow or green discharge from your nose over 3 days. . You have coughing fits . You develop a severe head ache or visual changes. . You develop shortness of breath, difficulty breathing or start having chest pain . Your symptoms persist after you have completed your treatment plan  MAKE SURE YOU   Understand these instructions.  Will watch your condition.  Will get help right away if you are not doing well or get worse.  Your e-visit answers were reviewed by a board certified advanced clinical practitioner to complete your personal care plan. Depending upon the condition, your plan could have included both over the counter or prescription medications. Please review your pharmacy choice. If there is a problem, you may call our nursing hot line at and have the prescription routed to another pharmacy. Your safety is important to us. If you have drug allergies check your prescription carefully.   You can use MyChart to ask questions about today's visit, request a non-urgent call back, or ask for a work or school excuse for 24 hours related to this e-Visit. If it has been greater than 24 hours you will need to follow up with your provider, or enter a new e-Visit to address those concerns. You will get an e-mail in the next two days asking about your experience.  I hope that your e-visit has been valuable and will speed your recovery. Thank you for using e-visits.   5 minutes spent reviewing and documenting in chart.    

## 2018-07-01 ENCOUNTER — Other Ambulatory Visit: Payer: Self-pay

## 2018-07-01 ENCOUNTER — Ambulatory Visit (INDEPENDENT_AMBULATORY_CARE_PROVIDER_SITE_OTHER): Payer: 59 | Admitting: Family Medicine

## 2018-07-01 ENCOUNTER — Encounter: Payer: Self-pay | Admitting: Family Medicine

## 2018-07-01 DIAGNOSIS — S46912A Strain of unspecified muscle, fascia and tendon at shoulder and upper arm level, left arm, initial encounter: Secondary | ICD-10-CM

## 2018-07-01 MED ORDER — CYCLOBENZAPRINE HCL 10 MG PO TABS: 10.0000 mg | ORAL_TABLET | Freq: Three times a day (TID) | ORAL | 0 refills | Status: DC | PRN

## 2018-07-01 MED ORDER — MELOXICAM 15 MG PO TABS: 15.0000 mg | ORAL_TABLET | Freq: Every day | ORAL | 0 refills | Status: DC

## 2018-07-01 NOTE — Progress Notes (Signed)
Virtual Visit via Video Note  I connected with [name] on 07/01/18 at  1:30 PM EDT by a video enabled telemedicine application and verified that I am speaking with the correct person using two identifiers.  Location: Patient: home Provider: office   I discussed the limitations of evaluation and management by telemedicine and the availability of in person appointments. The patient expressed understanding and agreed to proceed.  History of Present Illness:  Pt is home c/o L shoulder pain x 1 week ago.   Heat and aleve have not helped.  She may have done it lifting some heavy things that weekend    Observations/Objective: No vitals obtained Pt in NAD Pt has full ROm of L arm but pain with abduction, int rot  + muscle spasm L trap per pt  Assessment and Plan: 1. Shoulder strain, left, initial encounter Moist heat Muscle relaxer and meloxicam rto if no better - cyclobenzaprine (FLEXERIL) 10 MG tablet; Take 1 tablet (10 mg total) by mouth 3 (three) times daily as needed for muscle spasms.  Dispense: 30 tablet; Refill: 0 - meloxicam (MOBIC) 15 MG tablet; Take 1 tablet (15 mg total) by mouth daily.  Dispense: 30 tablet; Refill: 0   Follow Up Instructions:    I discussed the assessment and treatment plan with the patient. The patient was provided an opportunity to ask questions and all were answered. The patient agreed with the plan and demonstrated an understanding of the instructions.   The patient was advised to call back or seek an in-person evaluation if the symptoms worsen or if the condition fails to improve as anticipated.  I provided 15 minutes of non-face-to-face time during this encounter.   Ann Held, DO

## 2018-07-10 ENCOUNTER — Other Ambulatory Visit: Payer: Self-pay | Admitting: *Deleted

## 2018-07-10 ENCOUNTER — Encounter: Payer: PRIVATE HEALTH INSURANCE | Admitting: Family Medicine

## 2018-07-10 DIAGNOSIS — K219 Gastro-esophageal reflux disease without esophagitis: Secondary | ICD-10-CM

## 2018-07-10 MED ORDER — FAMOTIDINE 40 MG PO TABS: 40.0000 mg | ORAL_TABLET | Freq: Every day | ORAL | 1 refills | Status: DC

## 2018-07-15 ENCOUNTER — Other Ambulatory Visit: Payer: Self-pay

## 2018-07-15 ENCOUNTER — Telehealth: Payer: Self-pay

## 2018-07-15 DIAGNOSIS — K219 Gastro-esophageal reflux disease without esophagitis: Secondary | ICD-10-CM

## 2018-07-15 MED ORDER — PANTOPRAZOLE SODIUM 40 MG PO TBEC: 40.0000 mg | DELAYED_RELEASE_TABLET | Freq: Every day | ORAL | 0 refills | Status: DC

## 2018-07-15 NOTE — Telephone Encounter (Signed)
Refill sent.

## 2018-07-15 NOTE — Telephone Encounter (Signed)
Copied from [place] [id number]. Topic: General - Other >> Jul 15, 2018  8:21 AM Leward Quan A wrote: Reason for CRM: Patient called to say that her insurance say that they will not pay for her pantoprazole (PROTONIX) 40 MG tablet two a day. So she is asking can there be an Rx sent to the pharmacy for one a day with a 90 day supply. Ph# [phone]

## 2018-07-23 ENCOUNTER — Other Ambulatory Visit: Payer: Self-pay | Admitting: Family Medicine

## 2018-07-23 DIAGNOSIS — S46912A Strain of unspecified muscle, fascia and tendon at shoulder and upper arm level, left arm, initial encounter: Secondary | ICD-10-CM

## 2018-08-25 ENCOUNTER — Encounter: Payer: Self-pay | Admitting: Family Medicine

## 2018-09-04 ENCOUNTER — Other Ambulatory Visit: Payer: Self-pay

## 2018-09-04 ENCOUNTER — Encounter: Payer: Self-pay | Admitting: Family Medicine

## 2018-09-04 ENCOUNTER — Ambulatory Visit: Payer: 59 | Admitting: Family Medicine

## 2018-09-04 VITALS — BP 131/95 | HR 107 | Temp 98.9°F | Resp 18 | Ht 64.5 in | Wt 204.0 lb

## 2018-09-04 DIAGNOSIS — R1011 Right upper quadrant pain: Secondary | ICD-10-CM | POA: Diagnosis not present

## 2018-09-04 DIAGNOSIS — F329 Major depressive disorder, single episode, unspecified: Secondary | ICD-10-CM

## 2018-09-04 DIAGNOSIS — K219 Gastro-esophageal reflux disease without esophagitis: Secondary | ICD-10-CM | POA: Diagnosis not present

## 2018-09-04 DIAGNOSIS — F32A Depression, unspecified: Secondary | ICD-10-CM

## 2018-09-04 LAB — CBC WITH DIFFERENTIAL/PLATELET
Basophils Absolute: 0 10*3/uL (ref 0.0–0.1)
Basophils Relative: 0.6 % (ref 0.0–3.0)
Eosinophils Absolute: 0.1 10*3/uL (ref 0.0–0.7)
Eosinophils Relative: 1.8 % (ref 0.0–5.0)
HCT: 42.9 % (ref 36.0–46.0)
Hemoglobin: 14.3 g/dL (ref 12.0–15.0)
Lymphocytes Relative: 23.1 % (ref 12.0–46.0)
Lymphs Abs: 1.8 10*3/uL (ref 0.7–4.0)
MCHC: 33.3 g/dL (ref 30.0–36.0)
MCV: 96.5 fl (ref 78.0–100.0)
Monocytes Absolute: 0.6 10*3/uL (ref 0.1–1.0)
Monocytes Relative: 7.8 % (ref 3.0–12.0)
Neutro Abs: 5.2 10*3/uL (ref 1.4–7.7)
Neutrophils Relative %: 66.7 % (ref 43.0–77.0)
Platelets: 285 10*3/uL (ref 150.0–400.0)
RBC: 4.44 Mil/uL (ref 3.87–5.11)
RDW: 12.8 % (ref 11.5–15.5)
WBC: 7.9 10*3/uL (ref 4.0–10.5)

## 2018-09-04 LAB — COMPREHENSIVE METABOLIC PANEL
ALT: 8 U/L (ref 0–35)
AST: 10 U/L (ref 0–37)
Albumin: 4.6 g/dL (ref 3.5–5.2)
Alkaline Phosphatase: 46 U/L (ref 39–117)
BUN: 9 mg/dL (ref 6–23)
CO2: 26 mEq/L (ref 19–32)
Calcium: 9.5 mg/dL (ref 8.4–10.5)
Chloride: 102 mEq/L (ref 96–112)
Creatinine, Ser: 0.69 mg/dL (ref 0.40–1.20)
GFR: 88.73 mL/min (ref >60.00)
Glucose, Bld: 89 mg/dL (ref 70–99)
Potassium: 4 mEq/L (ref 3.5–5.1)
Sodium: 140 mEq/L (ref 135–145)
Total Bilirubin: 0.6 mg/dL (ref 0.2–1.2)
Total Protein: 6.9 g/dL (ref 6.0–8.3)

## 2018-09-04 LAB — AMYLASE: Amylase: 22 U/L — ABNORMAL LOW (ref 27–131)

## 2018-09-04 LAB — LIPASE: Lipase: 30 U/L (ref 11.0–59.0)

## 2018-09-04 LAB — H. PYLORI ANTIBODY, IGG: H Pylori IgG: POSITIVE — AB

## 2018-09-04 MED ORDER — FAMOTIDINE 40 MG PO TABS: 40.0000 mg | ORAL_TABLET | Freq: Every day | ORAL | 1 refills | Status: DC

## 2018-09-04 MED ORDER — BUPROPION HCL ER (XL) 150 MG PO TB24: 450.0000 mg | ORAL_TABLET | Freq: Every morning | ORAL | 3 refills | Status: DC

## 2018-09-04 NOTE — Progress Notes (Signed)
Patient ID: Vicki Le, female    DOB: 1965-09-03  Age: 53 y.o. MRN: [id number]    Subjective:  Subjective  HPI [name] presents for RUQ pain    No fever   Review of Systems  Constitutional: Negative for appetite change, diaphoresis, fatigue and unexpected weight change.  Eyes: Negative for pain, redness and visual disturbance.  Respiratory: Negative for cough, chest tightness, shortness of breath and wheezing.   Cardiovascular: Negative for chest pain, palpitations and leg swelling.  Gastrointestinal: Positive for abdominal pain. Negative for nausea and vomiting.  Endocrine: Negative for cold intolerance, heat intolerance, polydipsia, polyphagia and polyuria.  Genitourinary: Negative for difficulty urinating, dysuria and frequency.  Neurological: Negative for dizziness, light-headedness, numbness and headaches.    History Past Medical History:  Diagnosis Date  . Anxiety   . Chicken pox   . Depression   . GERD (gastroesophageal reflux disease)   . 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   Her family history includes Alzheimer's disease in her father; Breast cancer in her unknown relative; Colon cancer in her father; Depression in her unknown relative; Hypertension in her unknown relative.She reports that she has never smoked. She has never used smokeless tobacco. She reports current alcohol use. She reports that she does not use drugs.  Current Outpatient Medications on File Prior to Visit  Medication Sig Dispense Refill  . Ascorbic Acid (VITAMIN C) 1000 MG tablet Take 1,000 mg by mouth daily.    . cholecalciferol (VITAMIN D) 1000 UNITS tablet Take 1,000 Units by mouth daily.    . cyclobenzaprine (FLEXERIL) 10 MG tablet Take 1 tablet (10 mg total) by mouth 3 (three) times daily as needed for  muscle spasms. 30 tablet 0  . fexofenadine (ALLEGRA ALLERGY) 180 MG tablet Take 1 tablet (180 mg total) by mouth daily.    . fluticasone (FLONASE) 50 MCG/ACT nasal spray Place 2 sprays into both nostrils daily. 16 g 5  . levocetirizine (XYZAL) 5 MG tablet Take 1 tablet (5 mg total) by mouth every evening. 30 tablet 5  . meloxicam (MOBIC) 15 MG tablet TAKE 1 TABLET BY MOUTH EVERY DAY 30 tablet 0  . MIMVEY 1-0.5 MG tablet Take 1 tablet by mouth daily.    Marland Kitchen zinc gluconate 50 MG tablet Take 50 mg by mouth daily.     No current facility-administered medications on file prior to visit.      Objective:  Objective  Physical Exam Vitals signs and nursing note reviewed.  Constitutional:      Appearance: She is well-developed.  HENT:     Head: Normocephalic and atraumatic.  Eyes:     Conjunctiva/sclera: Conjunctivae normal.  Neck:     Musculoskeletal: Normal range of motion and neck supple.     Thyroid: No thyromegaly.     Vascular: No carotid bruit or JVD.  Cardiovascular:     Rate and Rhythm: Normal rate and regular rhythm.     Heart sounds: Normal heart sounds. No murmur.  Pulmonary:     Effort: Pulmonary effort is normal. No respiratory distress.     Breath sounds: Normal breath sounds. No wheezing or rales.  Chest:     Chest wall: No tenderness.  Abdominal:     Tenderness: There is abdominal tenderness in the right upper quadrant and epigastric area. There is  guarding. There is no rebound.  Neurological:     Mental Status: She is alert and oriented to person, place, and time.    BP (!) 131/95 (BP Location: Left Arm, Patient Position: Sitting, Cuff Size: Normal)   Pulse (!) 107   Temp 98.9 F (37.2 C) (Oral)   Resp 18   Ht 5' 4.5" (1.638 m)   Wt 204 lb (92.5 kg)   SpO2 99%   BMI 34.48 kg/m  Wt Readings from Last 3 Encounters:  09/04/18 204 lb (92.5 kg)  04/14/18 212 lb (96.2 kg)  01/06/18 214 lb (97.1 kg)     Lab Results  Component Value Date   WBC 7.9 09/04/2018    HGB 14.3 09/04/2018   HCT 42.9 09/04/2018   PLT 285.0 09/04/2018   GLUCOSE 89 09/04/2018   CHOL 185 10/12/2014   TRIG 255 (H) 10/12/2014   HDL 58 10/12/2014   LDLCALC 76 10/12/2014   ALT 8 09/04/2018   AST 10 09/04/2018   NA 140 09/04/2018   K 4.0 09/04/2018   CL 102 09/04/2018   CREATININE 0.69 09/04/2018   BUN 9 09/04/2018   CO2 26 09/04/2018   TSH 1.402 01/08/2014   HGBA1C 5.2 10/12/2014    Mm Digital Diagnostic Unilat R  Result Date: 06/12/2016 CLINICAL DATA:  Patient was called back from screening mammogram for right breast calcifications. EXAM: DIGITAL DIAGNOSTIC RIGHT MAMMOGRAM WITH CAD COMPARISON:  Previous exam(s). ACR Breast Density Category c: The breast tissue is heterogeneously dense, which may obscure small masses. FINDINGS: Magnification views of the upper-outer quadrant of the right breast were performed. There are scattered subcentimeter grouped calcifications in the breast. They have a differential appearance consistent with milk of calcium. No suspicious calcifications identified. Mammographic images were processed with CAD. IMPRESSION: No evidence of malignancy in the right breast. RECOMMENDATION: Bilateral screening mammogram in 1 year is recommended. I have discussed the findings and recommendations with the patient. Results were also provided in writing at the conclusion of the visit. If applicable, a reminder letter will be sent to the patient regarding the next appointment. BI-RADS CATEGORY  2: Benign. Electronically Signed   By: Lillia Mountain M.D.   On: 06/12/2016 09:12     Assessment & Plan:  Plan  I have discontinued Caydence Koenig. Cellucci's benzonatate. I am also having her maintain her cholecalciferol, vitamin C, zinc gluconate, [place], levocetirizine, fexofenadine, fluticasone, cyclobenzaprine, meloxicam, famotidine, and buPROPion.  Meds ordered this encounter  Medications  . famotidine (PEPCID) 40 MG tablet    Sig: Take 1 tablet (40 mg total) by mouth daily.     Dispense:  90 tablet    Refill:  1  . buPROPion (WELLBUTRIN XL) 150 MG 24 hr tablet    Sig: Take 3 tablets (450 mg total) by mouth every morning.    Dispense:  270 tablet    Refill:  3    Requested drug refills are authorized, however, the patient needs further evaluation and/or laboratory testing before further refills are given. Ask her to make an appointment for this.    Problem List Items Addressed This Visit      Unprioritized   Depression   Relevant Medications   buPROPion (WELLBUTRIN XL) 150 MG 24 hr tablet   GERD (gastroesophageal reflux disease)   Relevant Medications   famotidine (PEPCID) 40 MG tablet    Other Visit Diagnoses    RUQ pain    -  Primary   Relevant Orders   H. pylori  antibody, IgG (Completed)   CBC with Differential/Platelet (Completed)   Comprehensive metabolic panel (Completed)   Lipase (Completed)   Amylase (Completed)   US Abdomen Limited RUQ (Completed)    if pain worsens go to ER Follow-up: Return if symptoms worsen or fail to improve.  Ann Held, DO

## 2018-09-04 NOTE — Patient Instructions (Signed)
Abdominal Pain, Adult Abdominal pain can be caused by many things. Often, abdominal pain is not serious and it gets better with no treatment or by being treated at home. However, sometimes abdominal pain is serious. Your health care provider will do a medical history and a physical exam to try to determine the cause of your abdominal pain. Follow these instructions at home:  Take over-the-counter and prescription medicines only as told by your health care provider. Do not take a laxative unless told by your health care provider.  Drink enough fluid to keep your urine clear or pale yellow.  Watch your condition for any changes.  Keep all follow-up visits as told by your health care provider. This is important. Contact a health care provider if:  Your abdominal pain changes or gets worse.  You are not hungry or you lose weight without trying.  You are constipated or have diarrhea for more than 2-3 days.  You have pain when you urinate or have a bowel movement.  Your abdominal pain wakes you up at night.  Your pain gets worse with meals, after eating, or with certain foods.  You are throwing up and cannot keep anything down.  You have a fever. Get help right away if:  Your pain does not go away as soon as your health care provider told you to expect.  You cannot stop throwing up.  Your pain is only in areas of the abdomen, such as the right side or the left lower portion of the abdomen.  You have bloody or black stools, or stools that look like tar.  You have severe pain, cramping, or bloating in your abdomen.  You have signs of dehydration, such as: ? Dark urine, very little urine, or no urine. ? Cracked lips. ? Dry mouth. ? Sunken eyes. ? Sleepiness. ? Weakness. This information is not intended to replace advice given to you by your health care provider. Make sure you discuss any questions you have with your health care provider. Document Released: 11/01/2004 Document  Revised: 08/12/2015 Document Reviewed: 07/06/2015 Elsevier Interactive Patient Education  2020 Elsevier Inc.  

## 2018-09-05 ENCOUNTER — Ambulatory Visit (HOSPITAL_BASED_OUTPATIENT_CLINIC_OR_DEPARTMENT_OTHER)
Admission: RE | Admit: 2018-09-05 | Discharge: 2018-09-05 | Disposition: A | Discharge status: Home / Self Care | Payer: 59 | Source: Ambulatory Visit | Attending: Family Medicine | Admitting: Family Medicine

## 2018-09-05 ENCOUNTER — Other Ambulatory Visit: Payer: Self-pay | Admitting: Family Medicine

## 2018-09-05 ENCOUNTER — Encounter: Payer: Self-pay | Admitting: Family Medicine

## 2018-09-05 ENCOUNTER — Telehealth: Payer: Self-pay

## 2018-09-05 DIAGNOSIS — K219 Gastro-esophageal reflux disease without esophagitis: Secondary | ICD-10-CM

## 2018-09-05 DIAGNOSIS — R1011 Right upper quadrant pain: Secondary | ICD-10-CM | POA: Diagnosis present

## 2018-09-05 DIAGNOSIS — A048 Other specified bacterial intestinal infections: Secondary | ICD-10-CM

## 2018-09-05 MED ORDER — PANTOPRAZOLE SODIUM 40 MG PO TBEC: 40.0000 mg | DELAYED_RELEASE_TABLET | Freq: Two times a day (BID) | ORAL | 0 refills | Status: DC

## 2018-09-05 MED ORDER — CLARITHROMYCIN 500 MG PO TABS: 500.0000 mg | ORAL_TABLET | Freq: Two times a day (BID) | ORAL | 0 refills | Status: DC

## 2018-09-05 MED ORDER — AMOXICILLIN 500 MG PO CAPS: 1000.0000 mg | ORAL_CAPSULE | Freq: Two times a day (BID) | ORAL | 0 refills | Status: DC

## 2018-09-05 NOTE — Telephone Encounter (Signed)
Spoke with patient  Verbalized understanding

## 2018-09-05 NOTE — Telephone Encounter (Signed)
Copied from [place] [id number]. Topic: General - Other >> Sep 05, 2018 12:12 PM Nils Flack, Marland Kitchen wrote: Reason for CRM: pt would like to get a call about her results for [place] . She got them on mychart and does not know what they mean.  Please call [phone]

## 2018-09-10 ENCOUNTER — Encounter: Payer: Self-pay | Admitting: Family Medicine

## 2018-09-10 NOTE — Telephone Encounter (Signed)
I thought we discussed it during your visiit--- if not I apologize----- h pylori is a bacteria that can cause ulcers  We treat with a med like protonix but It needs to be taken 2x a day And antibiotics This should help the pain so I wanted to start the treatment right away and get GI to evaluate you

## 2018-09-11 ENCOUNTER — Telehealth: Payer: Self-pay | Admitting: Gastroenterology

## 2018-09-11 ENCOUNTER — Other Ambulatory Visit: Payer: Self-pay | Admitting: Family Medicine

## 2018-09-11 DIAGNOSIS — A048 Other specified bacterial intestinal infections: Secondary | ICD-10-CM

## 2018-09-11 NOTE — Telephone Encounter (Signed)
I have put the referral in --- - gwen will call with the appointment once it is made

## 2018-09-11 NOTE — Telephone Encounter (Signed)
Called patient, her appt. With Dr. Doyne Keel first available had already been made. Let her know since her PCP has already started her on antibiotics to treat the H-Pylori, she should be ok till the 10/21/18 appt.

## 2018-09-11 NOTE — Telephone Encounter (Signed)
Pt was previous seen in 2017; pt referred for H. pylori infection and would like to discuss.

## 2018-09-12 ENCOUNTER — Encounter: Payer: Self-pay | Admitting: Family Medicine

## 2018-09-12 ENCOUNTER — Other Ambulatory Visit: Payer: Self-pay | Admitting: Family Medicine

## 2018-09-12 DIAGNOSIS — N760 Acute vaginitis: Secondary | ICD-10-CM

## 2018-09-12 MED ORDER — FLUCONAZOLE 150 MG PO TABS: ORAL_TABLET | ORAL | 0 refills | Status: DC

## 2018-09-12 NOTE — Telephone Encounter (Signed)
Diflucan sent in

## 2018-09-23 ENCOUNTER — Other Ambulatory Visit: Payer: Self-pay | Admitting: Nurse Practitioner

## 2018-09-24 MED ORDER — FLUTICASONE PROPIONATE 50 MCG/ACT NA SUSP: 2.0000 | Freq: Every day | NASAL | 3 refills | Status: DC

## 2018-09-24 NOTE — Telephone Encounter (Signed)
Unsure why Rx was denied. Flonase refilled.

## 2018-09-24 NOTE — Telephone Encounter (Signed)
Pt called requesting back to discuss why Rx was refused, please advise.  Best contact: [phone]  Per insurance, pt requires 90 day supply of Rx

## 2018-09-24 NOTE — Addendum Note (Signed)
Addended byDamita Dunnings D on: 09/24/2018 02:04 PM   Modules accepted: Orders

## 2018-10-11 ENCOUNTER — Other Ambulatory Visit: Payer: Self-pay | Admitting: Family Medicine

## 2018-10-11 DIAGNOSIS — K219 Gastro-esophageal reflux disease without esophagitis: Secondary | ICD-10-CM

## 2018-10-11 DIAGNOSIS — A048 Other specified bacterial intestinal infections: Secondary | ICD-10-CM

## 2018-10-21 ENCOUNTER — Ambulatory Visit (INDEPENDENT_AMBULATORY_CARE_PROVIDER_SITE_OTHER): Payer: 59 | Admitting: Gastroenterology

## 2018-10-21 ENCOUNTER — Encounter: Payer: Self-pay | Admitting: Gastroenterology

## 2018-10-21 ENCOUNTER — Other Ambulatory Visit: Payer: Self-pay

## 2018-10-21 VITALS — BP 140/94 | HR 104 | Temp 98.5°F | Ht 63.25 in | Wt 202.4 lb

## 2018-10-21 DIAGNOSIS — R6881 Early satiety: Secondary | ICD-10-CM

## 2018-10-21 DIAGNOSIS — Z800 Family history of malignant neoplasm of digestive organs: Secondary | ICD-10-CM

## 2018-10-21 DIAGNOSIS — R194 Change in bowel habit: Secondary | ICD-10-CM

## 2018-10-21 DIAGNOSIS — K219 Gastro-esophageal reflux disease without esophagitis: Secondary | ICD-10-CM

## 2018-10-21 DIAGNOSIS — R140 Abdominal distension (gaseous): Secondary | ICD-10-CM

## 2018-10-21 DIAGNOSIS — R630 Anorexia: Secondary | ICD-10-CM | POA: Diagnosis not present

## 2018-10-21 DIAGNOSIS — R110 Nausea: Secondary | ICD-10-CM | POA: Diagnosis not present

## 2018-10-21 MED ORDER — SUPREP BOWEL PREP KIT 17.5-3.13-1.6 GM/177ML PO SOLN: ORAL | 0 refills | Status: DC

## 2018-10-21 MED ORDER — ONDANSETRON 4 MG PO TBDP: 4.0000 mg | ORAL_TABLET | Freq: Four times a day (QID) | ORAL | 1 refills | Status: DC | PRN

## 2018-10-21 NOTE — Progress Notes (Signed)
HPI :  53 y/o female who is known to me remotely from clinic visit in 02/09/2015 - here to be reassessed for new abdominal symptoms, referred back by Dr. Roma Schanz.  At our last visit she told me about her father who had a surgical resection of either colon cancer or an advanced polyp at age < 22 years old. She was referred for a colonoscopy as she never had one, but lost her insurance and had to cancel her exam. She states she continues to have variable frequency of her bowels which have been chronic. She has some urgency of her stools at times, and other times normal. Stool form is often loose. These symptoms are chronic. She does not have blood in the stools. She has some bloating which has bothered her recently. She has been treated empirically with a coarse of rifximin in the past, unclear how much this helped.   In recent months had worsening of bloating. She developed some nausea but no vomiting, nausea intermittent. She has had some early satiety associated with this. She does not have any postprandial pain. She has a lot of belching that bothers her as well. She has longstanding reflux, has been on PPI for years. Currently on protonix which works well for her and controls her reflux symptoms. No dysphagia. She has never had a prior EGD. She has never had a prior colonoscopy.  She had a RUQ [place] in July which showed gallstones, CBD not visualized. LFTs normal'. CBC normal'. She had a positive H pylori IgG serology 09/04/18 - positive - treated with amoxicillin, clarithromycin, PPI. She states the regimen did help some of her symptoms since then but they have not yet resolved.    CT abdomen pelvis 09/27/2014 - normal RUQ [place] 09/05/18 - ? Gallstones, CBD not visualized  Past Medical History:  Diagnosis Date  . Anxiety   . Chicken pox   . Depression   . GERD (gastroesophageal reflux disease)   . Melanoma ([place])    left buttock     Past Surgical History:  Procedure Laterality Date   . MELANOMA EXCISION  2013  . skin cancer removal  August 14, 2011   2 lymph nodes removed as well  . TONSILECTOMY/ADENOIDECTOMY WITH MYRINGOTOMY    . WRIST SURGERY  1991/1192   ganglion cyst r wrist   Family History  Problem Relation Age of Onset  . Alzheimer's disease Father   . Colon polyps Father        suspicious polyps and 8 in of colon removed ? colon cancer   . Depression Father   . Hypertension Father   . Depression Mother   . Kidney Stones Mother   . Breast cancer Maternal Grandmother   . Breast cancer Paternal Aunt    Social History   Tobacco Use  . Smoking status: Never Smoker  . Smokeless tobacco: Never Used  Substance Use Topics  . Alcohol use: Yes    Comment: occassionally  . Drug use: No   Current Outpatient Medications  Medication Sig Dispense Refill  . Ascorbic Acid (VITAMIN C) 1000 MG tablet Take 1,000 mg by mouth daily.    Marland Kitchen buPROPion (WELLBUTRIN XL) 150 MG 24 hr tablet Take 3 tablets (450 mg total) by mouth every morning. 270 tablet 3  . cholecalciferol (VITAMIN D) 1000 UNITS tablet Take 1,000 Units by mouth daily.    . famotidine (PEPCID) 40 MG tablet Take 1 tablet (40 mg total) by mouth daily. 90 tablet  1  . fexofenadine (ALLEGRA ALLERGY) 180 MG tablet Take 1 tablet (180 mg total) by mouth daily.    . fluticasone (FLONASE) 50 MCG/ACT nasal spray Place 2 sprays into both nostrils daily. 48 g 3  . levocetirizine (XYZAL) 5 MG tablet Take 1 tablet (5 mg total) by mouth every evening. 30 tablet 5  . MIMVEY 1-0.5 MG tablet Take 1 tablet by mouth daily.    . pantoprazole (PROTONIX) 40 MG tablet TAKE 1 TABLET BY MOUTH EVERY DAY 90 tablet 0  . zinc gluconate 50 MG tablet Take 50 mg by mouth daily.    . cyclobenzaprine (FLEXERIL) 10 MG tablet Take 1 tablet (10 mg total) by mouth 3 (three) times daily as needed for muscle spasms. (Patient not taking: Reported on 10/21/2018) 30 tablet 0  . meloxicam (MOBIC) 15 MG tablet TAKE 1 TABLET BY MOUTH EVERY DAY (Patient not  taking: Reported on 10/21/2018) 30 tablet 0  . SUPREP BOWEL PREP KIT 17.5-3.13-1.6 GM/177ML SOLN Suprep-Use as directed 354 mL 0   No current facility-administered medications for this visit.    Allergies  Allergen Reactions  . Vicodin [Hydrocodone-Acetaminophen] Nausea And Vomiting     Review of Systems: All systems reviewed and negative except where noted in HPI.    No results found.  Physical Exam: BP (!) 140/94 (BP Location: Left Arm, Patient Position: Sitting, Cuff Size: Normal)   Pulse (!) 104   Temp 98.5 F (36.9 C)   Ht 5' 3.25" (1.607 m) Comment: height measured without shoes  Wt 202 lb 6 oz (91.8 kg)   BMI 35.57 kg/m  Constitutional: Pleasant,well-developed, female in no acute distress. HEENT: Normocephalic and atraumatic. Conjunctivae are normal. No scleral icterus. Neck supple.  Cardiovascular: Normal rate, regular rhythm.  Pulmonary/chest: Effort normal and breath sounds normal. No wheezing, rales or rhonchi. Abdominal: Soft, nondistended, nontender. There are no masses palpable. No hepatomegaly. Extremities: no edema Lymphadenopathy: No cervical adenopathy noted. Neurological: Alert and oriented to person place and time. Skin: Skin is warm and dry. No rashes noted. Psychiatric: Normal mood and affect. Behavior is normal.   ASSESSMENT AND PLAN: 53 y/o female here for assessment of the following:  GERD / Early satiety / Nausea / Decreased appetite - she recently had a positive H pylori IgG serology and treated as above, some improvement but not resolved. She has gallstones on [place] but denies any abdominal pain, it seems less likely that the gallstones are driving her symptoms right now. Given her persistent symptoms, longstanding reflux, I offered her an EGD to further evaluate. I discussed risks / benefits of EGD and anesthesia and she wanted to proceed. Will plan on doing biopsies to assess for persistent H pylori infection and also will rule out celiac disease  given her bowel symptoms as below. She agreed, further recommendations pending the results. Continue protonix for now, will add Zofran PRN for nausea.  Bloating / Change in bowel habits / Family history of colon cancer - she is due for colonoscopy based on family history of colon cancer / advanced adenoma at a young age and lost insurance in 2017, was not able to do it. In light of her persistent bloating / bowel changes, recommend again that she have a colonoscopy. Discussed risks / benefits and she wanted to proceed, further recommendations pending results. Recommend trial of low FODMAP diet in the interim, will rule out celiac with EGD and microscopic colitis as well if colonoscopy normal.   [place] Cellar, MD [facility]  CC: Ann Held, *

## 2018-10-21 NOTE — Patient Instructions (Addendum)
If you are age 54 or older, your body mass index should be between 23-30. Your Body mass index is 35.57 kg/m. If this is out of the aforementioned range listed, please consider follow up with your Primary Care Provider.  If you are age 25 or younger, your body mass index should be between 19-25. Your Body mass index is 35.57 kg/m. If this is out of the aformentioned range listed, please consider follow up with your Primary Care Provider.   To help prevent the possible spread of infection to our patients, communities, and staff; we will be implementing the following measures:  As of now we are not allowing any visitors/family members to accompany you to any upcoming appointments with [facility]. If you have any concerns about this please contact our office to discuss prior to the appointment.    You have been scheduled for an endoscopy and colonoscopy. Please follow the written instructions given to you at your visit today. Please pick up your prep supplies at the pharmacy within the next 1-3 days. If you use inhalers (even only as needed), please bring them with you on the day of your procedure. Your physician has requested that you go to www.startemmi.com and enter the access code given to you at your visit today. This web site gives a general overview about your procedure. However, you should still follow specific instructions given to you by our office regarding your preparation for the procedure.  We are giving you a Low FOD-Map diet to follow.  We have sent the following medications to your pharmacy for you to pick up at your convenience: Zofran 4mg  ODT: Take every 6 hours as needed  Thank you for entrusting me with your care and for choosing [facility], Dr. [place] Cellar

## 2018-10-28 ENCOUNTER — Encounter: Payer: Self-pay | Admitting: Gastroenterology

## 2018-10-29 ENCOUNTER — Telehealth: Payer: Self-pay

## 2018-10-29 NOTE — Telephone Encounter (Signed)
Patient returned phone call states No to all questions.

## 2018-10-29 NOTE — Telephone Encounter (Signed)
Covid-19 screening questions   Do you now or have you had a fever in the last 14 days?  Do you have any respiratory symptoms of shortness of breath or cough now or in the last 14 days?  Do you have any family members or close contacts with diagnosed or suspected Covid-19 in the past 14 days?  Have you been tested for Covid-19 and found to be positive?       

## 2018-10-30 ENCOUNTER — Ambulatory Visit (AMBULATORY_SURGERY_CENTER): Payer: 59 | Admitting: Gastroenterology

## 2018-10-30 ENCOUNTER — Encounter: Payer: Self-pay | Admitting: Gastroenterology

## 2018-10-30 ENCOUNTER — Other Ambulatory Visit: Payer: Self-pay

## 2018-10-30 VITALS — BP 133/76 | HR 88 | Temp 98.8°F | Resp 27 | Ht 63.0 in | Wt 202.0 lb

## 2018-10-30 DIAGNOSIS — K219 Gastro-esophageal reflux disease without esophagitis: Secondary | ICD-10-CM | POA: Diagnosis present

## 2018-10-30 DIAGNOSIS — R6881 Early satiety: Secondary | ICD-10-CM

## 2018-10-30 DIAGNOSIS — K5730 Diverticulosis of large intestine without perforation or abscess without bleeding: Secondary | ICD-10-CM | POA: Diagnosis not present

## 2018-10-30 DIAGNOSIS — Z800 Family history of malignant neoplasm of digestive organs: Secondary | ICD-10-CM

## 2018-10-30 DIAGNOSIS — D120 Benign neoplasm of cecum: Secondary | ICD-10-CM | POA: Diagnosis not present

## 2018-10-30 DIAGNOSIS — R140 Abdominal distension (gaseous): Secondary | ICD-10-CM | POA: Diagnosis not present

## 2018-10-30 DIAGNOSIS — K2970 Gastritis, unspecified, without bleeding: Secondary | ICD-10-CM | POA: Diagnosis not present

## 2018-10-30 DIAGNOSIS — K648 Other hemorrhoids: Secondary | ICD-10-CM | POA: Diagnosis not present

## 2018-10-30 DIAGNOSIS — D122 Benign neoplasm of ascending colon: Secondary | ICD-10-CM | POA: Diagnosis not present

## 2018-10-30 DIAGNOSIS — R194 Change in bowel habit: Secondary | ICD-10-CM

## 2018-10-30 DIAGNOSIS — R110 Nausea: Secondary | ICD-10-CM

## 2018-10-30 DIAGNOSIS — R197 Diarrhea, unspecified: Secondary | ICD-10-CM

## 2018-10-30 DIAGNOSIS — K2950 Unspecified chronic gastritis without bleeding: Secondary | ICD-10-CM

## 2018-10-30 DIAGNOSIS — D123 Benign neoplasm of transverse colon: Secondary | ICD-10-CM

## 2018-10-30 MED ORDER — SODIUM CHLORIDE 0.9 % IV SOLN: 500.0000 mL | Freq: Once | INTRAVENOUS | Status: DC

## 2018-10-30 NOTE — Op Note (Signed)
[place] Patient Name: Vicki Le Procedure Date: 10/30/2018 1:55 PM MRN: [id number] Endoscopist: Remo Lipps P. Havery Moros , MD Age: 53 Referring MD:  Date of Birth: 09-30-1965 Gender: Female Account #: [id number] Procedure:                Upper GI endoscopy Indications:              Follow-up of gastro-esophageal reflux disease,                            Abdominal bloating, Early satiety, Nausea Medicines:                Monitored Anesthesia Care Procedure:                Pre-Anesthesia Assessment:                           - Prior to the procedure, a History and Physical                            was performed, and patient medications and                            allergies were reviewed. The patient's tolerance of                            previous anesthesia was also reviewed. The risks                            and benefits of the procedure and the sedation                            options and risks were discussed with the patient.                            All questions were answered, and informed consent                            was obtained. Prior Anticoagulants: The patient has                            taken no previous anticoagulant or antiplatelet                            agents. ASA Grade Assessment: II - A patient with                            mild systemic disease. After reviewing the risks                            and benefits, the patient was deemed in                            satisfactory condition to undergo the procedure.  After obtaining informed consent, the endoscope was                            passed under direct vision. Throughout the                            procedure, the patient's blood pressure, pulse, and                            oxygen saturations were monitored continuously. The                            Endoscope was introduced through the mouth, and                            advanced to  the second part of duodenum. The upper                            GI endoscopy was accomplished without difficulty.                            The patient tolerated the procedure well. Scope In: Scope Out: Findings:                 Esophagogastric landmarks were identified: the                            Z-line was found at 40 cm, the gastroesophageal                            junction was found at 40 cm and the upper extent of                            the gastric folds was found at 40 cm from the                            incisors.                           The exam of the esophagus was otherwise normal. No                            Barrett's esophagus.                           Patchy mildly erythematous mucosa was found in the                            gastric body without focal ulceration.                           The exam of the stomach was otherwise normal.                           Biopsies were  taken with a cold forceps in the                            gastric body, at the incisura and in the gastric                            antrum for Helicobacter pylori testing.                           The duodenal bulb and second portion of the                            duodenum were normal. Biopsies for histology were                            taken with a cold forceps for evaluation of celiac                            disease. Complications:            No immediate complications. Estimated blood loss:                            Minimal. Estimated Blood Loss:     Estimated blood loss was minimal. Impression:               - Esophagogastric landmarks identified.                           - Normal esophagus otherwise - no Barrett's                            esophagus                           - Erythematous mucosa in the gastric body.                           - Normal stomach otherwise - biopsies taken to rule                            out H pylori                            - Normal duodenal bulb and second portion of the                            duodenum. Biopsied. Recommendation:           - Patient has a contact number available for                            emergencies. The signs and symptoms of potential                            delayed complications were discussed with the  patient. Return to normal activities tomorrow.                            Written discharge instructions were provided to the                            patient.                           - Resume previous diet.                           - Continue present medications.                           - Await pathology results with further                            recommendations Remo Lipps P. Havery Moros, MD 10/30/2018 2:45:31 PM This report has been signed electronically.

## 2018-10-30 NOTE — Patient Instructions (Signed)
Read all handouts given to you by your recovery room nurse.  Thank-you for choosing [place] for your healthcare needs today.  YOU HAD AN ENDOSCOPIC PROCEDURE TODAY AT [facility]:   Refer to the procedure report that was given to you for any specific questions about what was found during the examination.  If the procedure report does not answer your questions, please call your gastroenterologist to clarify.  If you requested that your care partner not be given the details of your procedure findings, then the procedure report has been included in a sealed envelope for you to review at your convenience later.  YOU SHOULD EXPECT: Some feelings of bloating in the abdomen. Passage of more gas than usual.  Walking can help get rid of the air that was put into your GI tract during the procedure and reduce the bloating. If you had a lower endoscopy (such as a colonoscopy or flexible sigmoidoscopy) you may notice spotting of blood in your stool or on the toilet paper. If you underwent a bowel prep for your procedure, you may not have a normal bowel movement for a few days.  Please Note:  You might notice some irritation and congestion in your nose or some drainage.  This is from the oxygen used during your procedure.  There is no need for concern and it should clear up in a day or so.  SYMPTOMS TO REPORT IMMEDIATELY:   Following lower endoscopy (colonoscopy or flexible sigmoidoscopy):  Excessive amounts of blood in the stool  Significant tenderness or worsening of abdominal pains  Swelling of the abdomen that is new, acute  Fever of 100F or higher   Following upper endoscopy (EGD)  Vomiting of blood or coffee ground material  New chest pain or pain under the shoulder blades  Painful or persistently difficult swallowing  New shortness of breath  Fever of 100F or higher  Black, tarry-looking stools  For urgent or emergent issues, a gastroenterologist can be reached at any hour by calling  [phone].   DIET:  We do recommend a small meal at first, but then you may proceed to your regular diet.  Drink plenty of fluids but you should avoid alcoholic beverages for 24 hours. Try to increase the fiber in your diet, IF your Celiac test is negative.  ACTIVITY:  You should plan to take it easy for the rest of today and you should NOT DRIVE or use heavy machinery until tomorrow (because of the sedation medicines used during the test).    FOLLOW UP: Our staff will call the number listed on your records 48-72 hours following your procedure to check on you and address any questions or concerns that you may have regarding the information given to you following your procedure. If we do not reach you, we will leave a message.  We will attempt to reach you two times.  During this call, we will ask if you have developed any symptoms of COVID 19. If you develop any symptoms (ie: fever, flu-like symptoms, shortness of breath, cough etc.) before then, please call [phone].  If you test positive for Covid 19 in the 2 weeks post procedure, please call and report this information to [place].    If any biopsies were taken you will be contacted by phone or by letter within the next 1-3 weeks.  Please call us at [phone] if you have not heard about the biopsies in 3 weeks.    SIGNATURES/CONFIDENTIALITY: You and/or your  care partner have signed paperwork which will be entered into your electronic medical record.  These signatures attest to the fact that that the information above on your After Visit Summary has been reviewed and is understood.  Full responsibility of the confidentiality of this discharge information lies with you and/or your care-partner.

## 2018-10-30 NOTE — Progress Notes (Signed)
Called to room to assist during endoscopic procedure.  Patient ID and intended procedure confirmed with present staff. Received instructions for my participation in the procedure from the performing physician.  

## 2018-10-30 NOTE — Op Note (Signed)
[place] Patient Name: Vicki Le Procedure Date: 10/30/2018 1:54 PM MRN: [id number] Endoscopist: Remo Lipps P. Havery Moros , MD Age: 53 Referring MD:  Date of Birth: 1965/08/24 Gender: Female Account #: [id number] Procedure:                Colonoscopy Indications:              Screening in patient at increased risk: Family                            history of 1st-degree relative with colorectal                            cancer before age 1 years, bowel habit changes /                            loose stools, bloating Medicines:                Monitored Anesthesia Care Procedure:                Pre-Anesthesia Assessment:                           - Prior to the procedure, a History and Physical                            was performed, and patient medications and                            allergies were reviewed. The patient's tolerance of                            previous anesthesia was also reviewed. The risks                            and benefits of the procedure and the sedation                            options and risks were discussed with the patient.                            All questions were answered, and informed consent                            was obtained. Prior Anticoagulants: The patient has                            taken no previous anticoagulant or antiplatelet                            agents. ASA Grade Assessment: II - A patient with                            mild systemic disease. After reviewing the risks  and benefits, the patient was deemed in                            satisfactory condition to undergo the procedure.                           After obtaining informed consent, the colonoscope                            was passed under direct vision. Throughout the                            procedure, the patient's blood pressure, pulse, and                            oxygen saturations were monitored  continuously. The                            Colonoscope was introduced through the anus and                            advanced to the the terminal ileum, with                            identification of the appendiceal orifice and IC                            valve. The colonoscopy was performed without                            difficulty. The patient tolerated the procedure                            well. The quality of the bowel preparation was                            adequate. The terminal ileum, ileocecal valve,                            appendiceal orifice, and rectum were photographed. Scope In: 2:09:27 PM Scope Out: 2:34:11 PM Scope Withdrawal Time: 0 hours 22 minutes 43 seconds  Total Procedure Duration: 0 hours 24 minutes 44 seconds  Findings:                 Skin tags were found on perianal exam.                           The terminal ileum appeared normal.                           Three sessile polyps were found in the cecum. The                            polyps were 3 to 8 mm in size. These polyps were  removed with a cold snare. Resection and retrieval                            were complete.                           Three sessile polyps were found in the ascending                            colon. The polyps were 3 to 7 mm in size. These                            polyps were removed with a cold snare. Resection                            and retrieval were complete.                           Two sessile polyps were found in the transverse                            colon. The polyps were 3 to 4 mm in size. These                            polyps were removed with a cold snare. Resection                            and retrieval were complete.                           A few small-mouthed diverticula were found in the                            transverse colon and left colon.                           Internal hemorrhoids were found  during                            retroflexion. The hemorrhoids were small.                           The exam was otherwise without abnormality.                           Biopsies for histology were taken with a cold                            forceps from the right colon, left colon and                            transverse colon for evaluation of microscopic  colitis. Complications:            No immediate complications. Estimated blood loss:                            Minimal. Estimated Blood Loss:     Estimated blood loss was minimal. Impression:               - Perianal skin tags found on perianal exam.                           - The examined portion of the ileum was normal.                           - Three 3 to 8 mm polyps in the cecum, removed with                            a cold snare. Resected and retrieved.                           - Three 3 to 7 mm polyps in the ascending colon,                            removed with a cold snare. Resected and retrieved.                           - Two 3 to 4 mm polyps in the transverse colon,                            removed with a cold snare. Resected and retrieved.                           - Diverticulosis in the transverse colon and in the                            left colon.                           - Internal hemorrhoids.                           - The examination was otherwise normal.                           - Biopsies were taken with a cold forceps from the                            right colon, left colon and transverse colon for                            evaluation of microscopic colitis. Recommendation:           - Patient has a contact number available for  emergencies. The signs and symptoms of potential                            delayed complications were discussed with the                            patient. Return to normal activities tomorrow.                             Written discharge instructions were provided to the                            patient.                           - Resume previous diet.                           - Continue present medications.                           - Await pathology results. Remo Lipps P. Claudett Bayly, MD 10/30/2018 2:40:54 PM This report has been signed electronically.

## 2018-10-30 NOTE — Progress Notes (Signed)
Pt's states no medical or surgical changes since previsit or office visit.  Covid- [place]

## 2018-10-30 NOTE — Progress Notes (Signed)
Report given to PACU, vss 

## 2018-11-03 ENCOUNTER — Telehealth: Payer: Self-pay

## 2018-11-03 NOTE — Telephone Encounter (Signed)
Covid-19 screening questions   Do you now or have you had a fever in the last 14 days? No.  Do you have any respiratory symptoms of shortness of breath or cough now or in the last 14 days? No.  Do you have any family members or close contacts with diagnosed or suspected Covid-19 in the past 14 days? No.  Have you been tested for Covid-19 and found to be positive? No.       Follow up Call-  Call back number 10/30/2018  Post procedure Call Back phone  # [phone]  Permission to leave phone message Yes  Some recent data might be hidden     Patient questions:  Do you have a fever, pain , or abdominal swelling? No. Pain Score  0 *  Have you tolerated food without any problems? Yes.    Have you been able to return to your normal activities? Yes.    Do you have any questions about your discharge instructions: Diet   No. Medications  No. Follow up visit  No.  Do you have questions or concerns about your Care? No.  Actions: * If pain score is 4 or above: No action needed, pain <4.

## 2019-01-09 ENCOUNTER — Other Ambulatory Visit: Payer: Self-pay | Admitting: Family Medicine

## 2019-01-09 DIAGNOSIS — A048 Other specified bacterial intestinal infections: Secondary | ICD-10-CM

## 2019-01-09 DIAGNOSIS — K219 Gastro-esophageal reflux disease without esophagitis: Secondary | ICD-10-CM

## 2019-01-09 NOTE — Telephone Encounter (Signed)
Patient called to check on status of refill. Pt would like a 90 day supply. Please advise.

## 2019-01-21 ENCOUNTER — Ambulatory Visit (INDEPENDENT_AMBULATORY_CARE_PROVIDER_SITE_OTHER): Payer: 59 | Admitting: Family Medicine

## 2019-01-21 ENCOUNTER — Encounter: Payer: Self-pay | Admitting: Family Medicine

## 2019-01-21 DIAGNOSIS — J0140 Acute pansinusitis, unspecified: Secondary | ICD-10-CM | POA: Diagnosis not present

## 2019-01-21 MED ORDER — AMOXICILLIN-POT CLAVULANATE 875-125 MG PO TABS: 1.0000 | ORAL_TABLET | Freq: Two times a day (BID) | ORAL | 0 refills | Status: DC

## 2019-01-21 NOTE — Progress Notes (Signed)
Virtual Visit via Video Note  I connected with [name] on 01/21/19 at 11:20 AM EST by a video enabled telemedicine application and verified that I am speaking with the correct person using two identifiers.  Location: Patient: home alone  Provider: home    I discussed the limitations of evaluation and management by telemedicine and the availability of in person appointments. The patient expressed understanding and agreed to proceed.  History of Present Illness: Pt is home c/o feeling tired on Sunday.  Monday she had chills / sweats.  No fever.  Highest temp 98.9   She has body aches and last taste and smell.  + sinus drainage   She is taking robitussin  she is also taking flonase, allegra and zyrtec Observations/Objective: No vitals obtained today Pt is in nad  Assessment and Plan: 1. Acute non-recurrent pansinusitis con't flonase , zyrtec and allegra Add abx Pt will get covid test today And quarantine at home  Call back prn  - amoxicillin-clavulanate (AUGMENTIN) 875-125 MG tablet; Take 1 tablet by mouth 2 (two) times daily.  Dispense: 20 tablet; Refill: 0    Follow Up Instructions:    I discussed the assessment and treatment plan with the patient. The patient was provided an opportunity to ask questions and all were answered. The patient agreed with the plan and demonstrated an understanding of the instructions.   The patient was advised to call back or seek an in-person evaluation if the symptoms worsen or if the condition fails to improve as anticipated.  I provided 15 minutes of non-face-to-face time during this encounter.   Ann Held, DO

## 2019-01-23 ENCOUNTER — Other Ambulatory Visit: Payer: Self-pay | Admitting: Family Medicine

## 2019-01-23 ENCOUNTER — Other Ambulatory Visit: Payer: 59

## 2019-01-23 ENCOUNTER — Encounter: Payer: Self-pay | Admitting: Family Medicine

## 2019-01-23 DIAGNOSIS — N760 Acute vaginitis: Secondary | ICD-10-CM

## 2019-01-23 MED ORDER — FLUCONAZOLE 150 MG PO TABS: ORAL_TABLET | ORAL | 0 refills | Status: DC

## 2019-01-23 NOTE — Telephone Encounter (Signed)
rx sent in 

## 2019-01-27 NOTE — Telephone Encounter (Signed)
10days normally for quarantine and 72 hours without fever and no fever reducer No reason to recheck if symptoms resolve

## 2019-02-10 ENCOUNTER — Other Ambulatory Visit: Payer: Self-pay | Admitting: Family Medicine

## 2019-02-10 DIAGNOSIS — J0140 Acute pansinusitis, unspecified: Secondary | ICD-10-CM

## 2019-02-11 ENCOUNTER — Encounter: Payer: Self-pay | Admitting: Family Medicine

## 2019-02-11 NOTE — Telephone Encounter (Signed)
She can be seen in resp clinic

## 2019-02-26 ENCOUNTER — Telehealth: Payer: Self-pay | Admitting: Family Medicine

## 2019-02-26 NOTE — Telephone Encounter (Signed)
Please advise 

## 2019-02-26 NOTE — Telephone Encounter (Signed)
Spoke with patient. Per patient, she went to an Urgent Care for assessment. Reports CXR was negative and is being treated with antibiotics and steroids for bronchitis.

## 2019-02-26 NOTE — Telephone Encounter (Signed)
Pt has just removed from Covid and retested Negative. She declined a Materials engineer and  Wants to come in . Patient is still having chest congestion coughing with  mucus  & Congestion. Please advise if  [facility] would allow patient to come in person. Thanks

## 2019-02-26 NOTE — Telephone Encounter (Signed)
Its more than just being covid neg-----  we can t have anyone in here with chest congestion because if any one even catches a cold in here we are out of work for a week

## 2019-04-07 ENCOUNTER — Telehealth: Payer: Self-pay

## 2019-04-07 DIAGNOSIS — A048 Other specified bacterial intestinal infections: Secondary | ICD-10-CM

## 2019-04-07 DIAGNOSIS — K219 Gastro-esophageal reflux disease without esophagitis: Secondary | ICD-10-CM

## 2019-04-07 MED ORDER — FAMOTIDINE 40 MG PO TABS: 40.0000 mg | ORAL_TABLET | Freq: Every day | ORAL | 1 refills | Status: DC

## 2019-04-07 MED ORDER — PANTOPRAZOLE SODIUM 40 MG PO TBEC: 40.0000 mg | DELAYED_RELEASE_TABLET | Freq: Every day | ORAL | 0 refills | Status: DC

## 2019-04-07 NOTE — Telephone Encounter (Signed)
Refill sent.

## 2019-04-07 NOTE — Telephone Encounter (Signed)
Patient called in to see if Dr. Etter Sjogren could send in a prescription for  pantoprazole (PROTONIX) 40 MG tablet [id number]  90 day supply  & for famotidine (PEPCID) 40 MG tablet [id number] day supply   Please send it to: [id number] [place], [place]  [address] [place] [address]  Phone:  [phone] Fax:  [phone]  DEA #:  [id number]  Thanks,

## 2019-05-22 ENCOUNTER — Encounter: Payer: Self-pay | Admitting: Family Medicine

## 2019-05-22 ENCOUNTER — Telehealth (INDEPENDENT_AMBULATORY_CARE_PROVIDER_SITE_OTHER): Payer: 59 | Admitting: Family Medicine

## 2019-05-22 VITALS — Temp 98.5°F

## 2019-05-22 DIAGNOSIS — J0140 Acute pansinusitis, unspecified: Secondary | ICD-10-CM

## 2019-05-22 DIAGNOSIS — K219 Gastro-esophageal reflux disease without esophagitis: Secondary | ICD-10-CM | POA: Diagnosis not present

## 2019-05-22 MED ORDER — PANTOPRAZOLE SODIUM 40 MG PO TBEC: 40.0000 mg | DELAYED_RELEASE_TABLET | Freq: Every day | ORAL | 3 refills | Status: DC

## 2019-05-22 MED ORDER — AMOXICILLIN-POT CLAVULANATE 875-125 MG PO TABS: 1.0000 | ORAL_TABLET | Freq: Two times a day (BID) | ORAL | 0 refills | Status: DC

## 2019-05-22 MED ORDER — FLUCONAZOLE 150 MG PO TABS: ORAL_TABLET | ORAL | 0 refills | Status: DC

## 2019-05-22 NOTE — Progress Notes (Signed)
Virtual Visit via Video Note  I connected with Vicki Le on 05/22/19 at 10:20 AM EDT by a video enabled telemedicine application and verified that I am speaking with the correct person using two identifiers.  Location: Patient: home alone  Provider: office    I discussed the limitations of evaluation and management by telemedicine and the availability of in person appointments. The patient expressed understanding and agreed to proceed.  History of Present Illness: Pt is home c/o green mucus, pnd Symptoms x few days Pt taking mucinex and allergy  pills   No fever  She is also requesting a refill on her protonix.   No other complaints Observations/Objective: Vitals:   05/22/19 1009  Temp: 98.5 F (36.9 C)  pt is in nad   Assessment and Plan: 1. Acute non-recurrent pansinusitis abx per orders  con't flonase and antihistamine otc  - amoxicillin-clavulanate (AUGMENTIN) 875-125 MG tablet; Take 1 tablet by mouth 2 (two) times daily.  Dispense: 20 tablet; Refill: 0  2. Gastroesophageal reflux disease Stable Refill meds - pantoprazole (PROTONIX) 40 MG tablet; Take 1 tablet (40 mg total) by mouth daily.  Dispense: 90 tablet; Refill: 3   Follow Up Instructions:    I discussed the assessment and treatment plan with the patient. The patient was provided an opportunity to ask questions and all were answered. The patient agreed with the plan and demonstrated an understanding of the instructions.   The patient was advised to call back or seek an in-person evaluation if the symptoms worsen or if the condition fails to improve as anticipated.  I provided 25 minutes of non-face-to-face time during this encounter.   Ann Held, DO

## 2019-08-25 ENCOUNTER — Encounter: Payer: Self-pay | Admitting: Family Medicine

## 2019-08-25 ENCOUNTER — Other Ambulatory Visit: Payer: Self-pay

## 2019-08-25 ENCOUNTER — Telehealth (INDEPENDENT_AMBULATORY_CARE_PROVIDER_SITE_OTHER): Payer: Self-pay | Admitting: Family Medicine

## 2019-08-25 VITALS — Temp 98.7°F | Ht 63.0 in

## 2019-08-25 DIAGNOSIS — J302 Other seasonal allergic rhinitis: Secondary | ICD-10-CM

## 2019-08-25 DIAGNOSIS — N760 Acute vaginitis: Secondary | ICD-10-CM

## 2019-08-25 DIAGNOSIS — J0140 Acute pansinusitis, unspecified: Secondary | ICD-10-CM

## 2019-08-25 MED ORDER — FLUCONAZOLE 150 MG PO TABS: ORAL_TABLET | ORAL | 0 refills | Status: DC

## 2019-08-25 MED ORDER — AMOXICILLIN-POT CLAVULANATE 875-125 MG PO TABS: 1.0000 | ORAL_TABLET | Freq: Two times a day (BID) | ORAL | 0 refills | Status: DC

## 2019-08-25 MED ORDER — MONTELUKAST SODIUM 10 MG PO TABS: 10.0000 mg | ORAL_TABLET | Freq: Every day | ORAL | 3 refills | Status: DC

## 2019-08-25 NOTE — Progress Notes (Signed)
Virtual Visit via Video Note  I connected with [name] on 08/25/19 at  4:20 PM EDT by a video enabled telemedicine application and verified that I am speaking with the correct person using two identifiers.  Location: Patient: home alone  Provider: office    I discussed the limitations of evaluation and management by telemedicine and the availability of in person appointments. The patient expressed understanding and agreed to proceed.  History of Present Illness:   pt is home c/o cough that is sometimes productive, no fever   No wheezing,  + chest is tight  + pressure in sinuses   Observations/Objective: Vitals:   08/25/19 1614  Temp: 98.7 F (37.1 C)     Assessment and Plan: 1. Acute non-recurrent pansinusitis abx per orders  flonase  Call or rto prn  - amoxicillin-clavulanate (AUGMENTIN) 875-125 MG tablet; Take 1 tablet by mouth 2 (two) times daily.  Dispense: 20 tablet; Refill: 0  2. Acute vaginitis Diflucan sen - fluconazole (DIFLUCAN) 150 MG tablet; 1 po x1, may repeat in 3 days prn  Dispense: 2 tablet; Refill: 0  3. Seasonal allergies con't flonase and antihistamine and add singulair - montelukast (SINGULAIR) 10 MG tablet; Take 1 tablet (10 mg total) by mouth at bedtime.  Dispense: 30 tablet; Refill: 3  Follow Up Instructions:    I discussed the assessment and treatment plan with the patient. The patient was provided an opportunity to ask questions and all were answered. The patient agreed with the plan and demonstrated an understanding of the instructions.   The patient was advised to call back or seek an in-person evaluation if the symptoms worsen or if the condition fails to improve as anticipated.  I provided 30 minutes of non-face-to-face time during this encounter.   Ann Held, DO

## 2019-09-07 ENCOUNTER — Encounter: Payer: Self-pay | Admitting: Family Medicine

## 2019-09-07 MED ORDER — LEVOFLOXACIN 500 MG PO TABS
500.0000 mg | ORAL_TABLET | Freq: Every day | ORAL | 0 refills | Status: DC
Start: 2019-09-07 — End: 2019-11-27

## 2019-09-07 NOTE — Telephone Encounter (Signed)
levaquin 500 mg 1 po qd x 7 days Ov if not better

## 2019-09-07 NOTE — Telephone Encounter (Signed)
Okay to write requested note?

## 2019-09-07 NOTE — Telephone Encounter (Signed)
Yes

## 2019-09-24 ENCOUNTER — Telehealth: Payer: Self-pay | Admitting: Family Medicine

## 2019-09-24 ENCOUNTER — Encounter: Payer: Self-pay | Admitting: Family Medicine

## 2019-09-24 NOTE — Telephone Encounter (Signed)
Awaiting PCP response to see if she will complete form or not. See mychart messages.

## 2019-09-24 NOTE — Telephone Encounter (Signed)
CallerAylissa Heinemann  Call Back # [phone]  Patient states she will upload a form to  Her my chart for Dr Etter Sjogren to fill in regards to Covid 19 vaccine. Per patient her employer is requiring this form to be filled out so that she can work on Monday 09/28/2019. Patient states Dr. Etter Sjogren a letter exempting her from getting vaccinization when pandemic first began but job is requiring addinational form.

## 2019-09-24 NOTE — Telephone Encounter (Signed)
done

## 2019-10-01 ENCOUNTER — Encounter: Payer: Self-pay | Admitting: Family Medicine

## 2019-10-01 DIAGNOSIS — F32A Depression, unspecified: Secondary | ICD-10-CM

## 2019-10-02 MED ORDER — FLUTICASONE PROPIONATE 50 MCG/ACT NA SUSP: 2.0000 | Freq: Every day | NASAL | 3 refills | Status: DC

## 2019-10-02 MED ORDER — BUPROPION HCL ER (XL) 150 MG PO TB24: 450.0000 mg | ORAL_TABLET | Freq: Every morning | ORAL | 3 refills | Status: DC

## 2019-10-19 ENCOUNTER — Encounter: Payer: Self-pay | Admitting: Family Medicine

## 2019-10-19 NOTE — Telephone Encounter (Signed)
She will need ov to discuss I really do not like xanax --- esp with wellbutrin

## 2019-11-17 ENCOUNTER — Other Ambulatory Visit: Payer: Self-pay | Admitting: Family Medicine

## 2019-11-17 DIAGNOSIS — J302 Other seasonal allergic rhinitis: Secondary | ICD-10-CM

## 2019-11-18 ENCOUNTER — Encounter: Payer: Self-pay | Admitting: Family Medicine

## 2019-11-19 NOTE — Telephone Encounter (Signed)
Virtual visit 

## 2019-11-27 ENCOUNTER — Other Ambulatory Visit: Payer: Self-pay

## 2019-11-27 ENCOUNTER — Telehealth (INDEPENDENT_AMBULATORY_CARE_PROVIDER_SITE_OTHER): Payer: Managed Care, Other (non HMO) | Admitting: Family Medicine

## 2019-11-27 ENCOUNTER — Encounter: Payer: Self-pay | Admitting: Family Medicine

## 2019-11-27 VITALS — Temp 98.7°F | Ht 63.0 in

## 2019-11-27 DIAGNOSIS — R110 Nausea: Secondary | ICD-10-CM

## 2019-11-27 DIAGNOSIS — R059 Cough, unspecified: Secondary | ICD-10-CM | POA: Diagnosis not present

## 2019-11-27 DIAGNOSIS — J0140 Acute pansinusitis, unspecified: Secondary | ICD-10-CM | POA: Diagnosis not present

## 2019-11-27 MED ORDER — AMOXICILLIN-POT CLAVULANATE 875-125 MG PO TABS: 1.0000 | ORAL_TABLET | Freq: Two times a day (BID) | ORAL | 0 refills | Status: DC

## 2019-11-27 MED ORDER — ONDANSETRON 4 MG PO TBDP: 4.0000 mg | ORAL_TABLET | Freq: Three times a day (TID) | ORAL | 0 refills | Status: DC | PRN

## 2019-11-27 MED ORDER — BENZONATATE 100 MG PO CAPS: 200.0000 mg | ORAL_CAPSULE | Freq: Three times a day (TID) | ORAL | 0 refills | Status: DC | PRN

## 2019-11-27 MED ORDER — PREDNISONE 10 MG PO TABS: ORAL_TABLET | ORAL | 0 refills | Status: DC

## 2019-11-27 NOTE — Progress Notes (Signed)
Virtual Visit via Video Note  I connected with Vicki Le on 11/27/19 at  1:20 PM EDT by a video enabled telemedicine application and verified that I am speaking with the correct person using two identifiers.  Location: Patient: home alone  Provider: office      I discussed the limitations of evaluation and management by telemedicine and the availability of in person appointments. The patient expressed understanding and agreed to proceed.  History of Present Illness: Pt is home c/o sinus congestion + cough, sometimes productive  + yellow/ green   No fever   Pt is taking mucinex otc   She is taking her regular all meds and nasal spray She has covid in dec and has a hx of anaphylaxis so has not had the covid vaccine.     Observations/Objective: Vitals:   11/27/19 1319  Temp: 98.7 F (37.1 C)  pt is in NAD  No sob    Assessment and Plan: 1. Acute non-recurrent pansinusitis abx and pred per orders  con't other all med - amoxicillin-clavulanate (AUGMENTIN) 875-125 MG tablet; Take 1 tablet by mouth 2 (two) times daily.  Dispense: 20 tablet; Refill: 0 - predniSONE (DELTASONE) 10 MG tablet; TAKE 3 TABLETS PO QD FOR 3 DAYS THEN TAKE 2 TABLETS PO QD FOR 3 DAYS THEN TAKE 1 TABLET PO QD FOR 3 DAYS THEN TAKE 1/2 TAB PO QD FOR 3 DAYS  Dispense: 20 tablet; Refill: 0   Follow Up Instructions:    I discussed the assessment and treatment plan with the patient. The patient was provided an opportunity to ask questions and all were answered. The patient agreed with the plan and demonstrated an understanding of the instructions.   The patient was advised to call back or seek an in-person evaluation if the symptoms worsen or if the condition fails to improve as anticipated.  I provided 25 minutes of non-face-to-face time during this encounter.   Ann Held, DO

## 2019-11-30 ENCOUNTER — Encounter: Payer: Self-pay | Admitting: Family Medicine

## 2019-12-01 ENCOUNTER — Other Ambulatory Visit: Payer: Self-pay | Admitting: Family Medicine

## 2019-12-01 DIAGNOSIS — N760 Acute vaginitis: Secondary | ICD-10-CM

## 2019-12-01 MED ORDER — FLUCONAZOLE 150 MG PO TABS: ORAL_TABLET | ORAL | 0 refills | Status: DC

## 2019-12-01 NOTE — Telephone Encounter (Signed)
Please advise 

## 2019-12-01 NOTE — Telephone Encounter (Signed)
done

## 2019-12-11 ENCOUNTER — Ambulatory Visit (HOSPITAL_BASED_OUTPATIENT_CLINIC_OR_DEPARTMENT_OTHER)
Admission: RE | Admit: 2019-12-11 | Discharge: 2019-12-11 | Disposition: A | Discharge status: Home / Self Care | Payer: Managed Care, Other (non HMO) | Source: Ambulatory Visit | Attending: Family Medicine | Admitting: Family Medicine

## 2019-12-11 ENCOUNTER — Encounter: Payer: Self-pay | Admitting: Family Medicine

## 2019-12-11 ENCOUNTER — Ambulatory Visit (INDEPENDENT_AMBULATORY_CARE_PROVIDER_SITE_OTHER): Payer: Managed Care, Other (non HMO) | Admitting: Family Medicine

## 2019-12-11 ENCOUNTER — Other Ambulatory Visit: Payer: Self-pay

## 2019-12-11 VITALS — BP 120/90 | HR 91 | Temp 98.8°F | Resp 18 | Ht 63.0 in | Wt 194.6 lb

## 2019-12-11 DIAGNOSIS — U099 Post covid-19 condition, unspecified: Secondary | ICD-10-CM | POA: Insufficient documentation

## 2019-12-11 DIAGNOSIS — K219 Gastro-esophageal reflux disease without esophagitis: Secondary | ICD-10-CM | POA: Diagnosis not present

## 2019-12-11 DIAGNOSIS — Z0000 Encounter for general adult medical examination without abnormal findings: Secondary | ICD-10-CM

## 2019-12-11 DIAGNOSIS — R079 Chest pain, unspecified: Secondary | ICD-10-CM

## 2019-12-11 DIAGNOSIS — F418 Other specified anxiety disorders: Secondary | ICD-10-CM | POA: Diagnosis not present

## 2019-12-11 DIAGNOSIS — Z0001 Encounter for general adult medical examination with abnormal findings: Secondary | ICD-10-CM | POA: Diagnosis not present

## 2019-12-11 MED ORDER — FAMOTIDINE 40 MG PO TABS: 40.0000 mg | ORAL_TABLET | Freq: Every day | ORAL | 1 refills | Status: DC

## 2019-12-11 MED ORDER — FLUOXETINE HCL 20 MG PO TABS: 20.0000 mg | ORAL_TABLET | Freq: Every day | ORAL | 3 refills | Status: DC

## 2019-12-11 MED ORDER — PANTOPRAZOLE SODIUM 40 MG PO TBEC: 40.0000 mg | DELAYED_RELEASE_TABLET | Freq: Every day | ORAL | 3 refills | Status: DC

## 2019-12-11 NOTE — Patient Instructions (Signed)

## 2019-12-11 NOTE — Progress Notes (Signed)
Subjective:     Vicki Le is a 54 y.o. female and is here for a comprehensive physical exam. The patient reports still having cough, congestion since covid last dec and chest pain.   She gets tested for covid weekly and it has been neg.  Her anxiety is worsening ---- the celexa made her worse so she does not want that again but she is willing to try something else    Social History   Socioeconomic History  . Marital status: Married    Spouse name: Not on file  . Number of children: 0  . Years of education: Not on file  . Highest education level: Not on file  Occupational History  . Occupation: Marketing executive  Tobacco Use  . Smoking status: Never Smoker  . Smokeless tobacco: Never Used  Vaping Use  . Vaping Use: Never used  Substance and Sexual Activity  . Alcohol use: Yes    Comment: occassionally  . Drug use: No  . Sexual activity: Not on file  Other Topics Concern  . Not on file  Social History Narrative  . Not on file   Social Determinants of Health   Financial Resource Strain:   . Difficulty of Paying Living Expenses: Not on file  Food Insecurity:   . Worried About Charity fundraiser in the Last Year: Not on file  . Ran Out of Food in the Last Year: Not on file  Transportation Needs:   . Lack of Transportation (Medical): Not on file  . Lack of Transportation (Non-Medical): Not on file  Physical Activity:   . Days of Exercise per Week: Not on file  . Minutes of Exercise per Session: Not on file  Stress:   . Feeling of Stress : Not on file  Social Connections:   . Frequency of Communication with Friends and Family: Not on file  . Frequency of Social Gatherings with Friends and Family: Not on file  . Attends Religious Services: Not on file  . Active Member of Clubs or Organizations: Not on file  . Attends Archivist Meetings: Not on file  . Marital Status: Not on file  Intimate Partner Violence:   . Fear of Current or Ex-Partner: Not on file   . Emotionally Abused: Not on file  . Physically Abused: Not on file  . Sexually Abused: Not on file   Health Maintenance  Topic Date Due  . Hepatitis C Screening  Never done  . COVID-19 Vaccine (1) Never done  . HIV Screening  Never done  . INFLUENZA VACCINE  05/27/2027 (Originally 09/06/2019)  . MAMMOGRAM  12/10/2020  . PAP SMEAR-Modifier  12/10/2020  . COLONOSCOPY  10/29/2021  . TETANUS/TDAP  10/02/2024    The following portions of the patient's history were reviewed and updated as appropriate:  She  has a past medical history of Anxiety, Chicken pox, Depression, GERD (gastroesophageal reflux disease), and Melanoma ([place]). She does not have any pertinent problems on file. She  has a past surgical history that includes skin cancer removal (August 14, 2011); Tonsilectomy/adenoidectomy with myringotomy; Wrist surgery (1991/1192); and Melanoma excision (2013). Her family history includes Alzheimer's disease in her father; Breast cancer in her maternal grandmother and paternal aunt; Colon polyps in her father; Depression in her father and mother; Hypertension in her father; Kidney Stones in her mother. She  reports that she has never smoked. She has never used smokeless tobacco. She reports current alcohol use. She reports that she does  not use drugs. She has a current medication list which includes the following prescription(s): vitamin c, benzonatate, bupropion, cholecalciferol, famotidine, fexofenadine, fluconazole, fluticasone, levocetirizine, mimvey, montelukast, ondansetron, pantoprazole, and zinc gluconate. Current Outpatient Medications on File Prior to Visit  Medication Sig Dispense Refill  . Ascorbic Acid (VITAMIN C) 1000 MG tablet Take 1,000 mg by mouth daily.    . benzonatate (TESSALON) 100 MG capsule Take 2 capsules (200 mg total) by mouth 3 (three) times daily as needed for cough. 30 capsule 0  . buPROPion (WELLBUTRIN XL) 150 MG 24 hr tablet Take 3 tablets (450 mg total) by mouth  every morning. 270 tablet 3  . cholecalciferol (VITAMIN D) 1000 UNITS tablet Take 1,000 Units by mouth daily.    . fexofenadine (ALLEGRA ALLERGY) 180 MG tablet Take 1 tablet (180 mg total) by mouth daily.    . fluconazole (DIFLUCAN) 150 MG tablet 1 po x1, may repeat in 3 days prn 2 tablet 0  . fluticasone (FLONASE) 50 MCG/ACT nasal spray Place 2 sprays into both nostrils daily. 48 g 3  . levocetirizine (XYZAL) 5 MG tablet Take 1 tablet (5 mg total) by mouth every evening. 30 tablet 5  . MIMVEY 1-0.5 MG tablet Take 1 tablet by mouth daily.    . montelukast (SINGULAIR) 10 MG tablet TAKE 1 TABLET BY MOUTH EVERYDAY AT BEDTIME 90 tablet 1  . ondansetron (ZOFRAN ODT) 4 MG disintegrating tablet Take 1 tablet (4 mg total) by mouth every 8 (eight) hours as needed for nausea or vomiting. 20 tablet 0  . pantoprazole (PROTONIX) 40 MG tablet Take 1 tablet (40 mg total) by mouth daily. 90 tablet 3  . zinc gluconate 50 MG tablet Take 50 mg by mouth daily.     No current facility-administered medications on file prior to visit.   She is allergic to vicodin [hydrocodone-acetaminophen]..  Review of Systems Review of Systems  Constitutional: Negative for activity change, appetite change and fatigue.  HENT: Negative for hearing loss, congestion, tinnitus and ear discharge.  dentist q49m Eyes: Negative for visual disturbance (see optho q1y -- vision corrected to 20/20 with glasses).  Respiratory: Negative for cough, chest tightness and shortness of breath.   Cardiovascular: Negative for chest pain, palpitations and leg swelling.  Gastrointestinal: Negative for abdominal pain, diarrhea, constipation and abdominal distention.  Genitourinary: Negative for urgency, frequency, decreased urine volume and difficulty urinating.  Musculoskeletal: Negative for back pain, arthralgias and gait problem.  Skin: Negative for color change, pallor and rash.  Neurological: Negative for dizziness, light-headedness, numbness and  headaches.  Hematological: Negative for adenopathy. Does not bruise/bleed easily.  Psychiatric/Behavioral: Negative for suicidal ideas, confusion, sleep disturbance, self-injury, dysphoric mood, decreased concentration and agitation.       Objective:    BP 120/90 (BP Location: Left Arm, Patient Position: Sitting, Cuff Size: Normal)   Pulse 91   Temp 98.8 F (37.1 C) (Oral)   Resp 18   Ht 5\' 3"  (1.6 m)   Wt 194 lb 9.6 oz (88.3 kg)   SpO2 100%   BMI 34.47 kg/m  General appearance: alert, cooperative, appears stated age and no distress Head: Normocephalic, without obvious abnormality, atraumatic Eyes: conjunctivae/corneas clear. PERRL, EOM's intact. Fundi benign. Ears: normal TM's and external ear canals both ears Nose: Nares normal. Septum midline. Mucosa normal. No drainage or sinus tenderness. Throat: lips, mucosa, and tongue normal; teeth and gums normal Neck: no adenopathy, no carotid bruit, no JVD, supple, symmetrical, trachea midline and thyroid not enlarged, symmetric, no  tenderness/mass/nodules Back: symmetric, no curvature. ROM normal. No CVA tenderness. Lungs: clear to auscultation bilaterally Breasts: gyn Heart: regular rate and rhythm, S1, S2 normal, no murmur, click, rub or gallop Abdomen: soft, non-tender; bowel sounds normal; no masses,  no organomegaly Pelvic: deferred ---gyn Extremities: extremities normal, atraumatic, no cyanosis or edema Pulses: 2+ and symmetric Skin: Skin color, texture, turgor normal. No rashes or lesions Lymph nodes: Cervical, supraclavicular, and axillary nodes normal. Neurologic: Alert and oriented X 3, normal strength and tone. Normal symmetric reflexes. Normal coordination and gait    Assessment:    Healthy female exam.      Plan:     ghm utd Check labs See After Visit Summary for Counseling Recommendations    1. Gastroesophageal reflux disease Stable Refill meds - famotidine (PEPCID) 40 MG tablet; Take 1 tablet (40 mg  total) by mouth daily.  Dispense: 90 tablet; Refill: 1 - pantoprazole (PROTONIX) 40 MG tablet; Take 1 tablet (40 mg total) by mouth daily.  Dispense: 90 tablet; Refill: 3  2. Chest pain, unspecified type ekg-- no acute changes Check cxr  - EKG 12-Lead - DG Chest 2 View; Future  3. Post covid-19 condition, unspecified ekg normal Check cxr  - EKG 12-Lead - DG Chest 2 View; Future  4. Depression with anxiety Anxiety is worse ---  Start prozac F/u 1 month or sooner prn  - FLUoxetine (PROZAC) 20 MG tablet; Take 1 tablet (20 mg total) by mouth daily.  Dispense: 30 tablet; Refill: 3  5. Preventative health care See above  - Lipid panel - CBC with Differential/Platelet - TSH - Comprehensive metabolic panel

## 2019-12-12 LAB — COMPREHENSIVE METABOLIC PANEL
AG Ratio: 2.1 (calc) (ref 1.0–2.5)
ALT: 8 U/L (ref 6–29)
AST: 9 U/L — ABNORMAL LOW (ref 10–35)
Albumin: 4.1 g/dL (ref 3.6–5.1)
Alkaline phosphatase (APISO): 43 U/L (ref 37–153)
BUN/Creatinine Ratio: 10 (calc) (ref 6–22)
BUN: 6 mg/dL — ABNORMAL LOW (ref 7–25)
CO2: 31 mmol/L (ref 20–32)
Calcium: 9.3 mg/dL (ref 8.6–10.4)
Chloride: 103 mmol/L (ref 98–110)
Creat: 0.62 mg/dL (ref 0.50–1.05)
Globulin: 2 g/dL (calc) (ref 1.9–3.7)
Glucose, Bld: 90 mg/dL (ref 65–99)
Potassium: 4.4 mmol/L (ref 3.5–5.3)
Sodium: 140 mmol/L (ref 135–146)
Total Bilirubin: 0.5 mg/dL (ref 0.2–1.2)
Total Protein: 6.1 g/dL (ref 6.1–8.1)

## 2019-12-12 LAB — LIPID PANEL
Cholesterol: 150 mg/dL (ref <200)
HDL: 59 mg/dL (ref >=50)
LDL Cholesterol (Calc): 65 mg/dL (calc)
Non-HDL Cholesterol (Calc): 91 mg/dL (calc) (ref <130)
Total CHOL/HDL Ratio: 2.5 (calc) (ref <5.0)
Triglycerides: 191 mg/dL — ABNORMAL HIGH (ref <150)

## 2019-12-12 LAB — CBC WITH DIFFERENTIAL/PLATELET
Absolute Monocytes: 905 cells/uL (ref 200–950)
Basophils Absolute: 52 cells/uL (ref 0–200)
Basophils Relative: 0.5 %
Eosinophils Absolute: 146 cells/uL (ref 15–500)
Eosinophils Relative: 1.4 %
HCT: 42.2 % (ref 35.0–45.0)
Hemoglobin: 14 g/dL (ref 11.7–15.5)
Lymphs Abs: 2174 cells/uL (ref 850–3900)
MCH: 32 pg (ref 27.0–33.0)
MCHC: 33.2 g/dL (ref 32.0–36.0)
MCV: 96.3 fL (ref 80.0–100.0)
MPV: 10.3 fL (ref 7.5–12.5)
Monocytes Relative: 8.7 %
Neutro Abs: 7124 cells/uL (ref 1500–7800)
Neutrophils Relative %: 68.5 %
Platelets: 345 10*3/uL (ref 140–400)
RBC: 4.38 10*6/uL (ref 3.80–5.10)
RDW: 11.6 % (ref 11.0–15.0)
Total Lymphocyte: 20.9 %
WBC: 10.4 10*3/uL (ref 3.8–10.8)

## 2019-12-12 LAB — TSH: TSH: 1.32 mIU/L

## 2019-12-13 ENCOUNTER — Encounter: Payer: Self-pay | Admitting: Family Medicine

## 2019-12-14 ENCOUNTER — Other Ambulatory Visit: Payer: Self-pay | Admitting: *Deleted

## 2019-12-14 MED ORDER — LEVOFLOXACIN 500 MG PO TABS: 500.0000 mg | ORAL_TABLET | Freq: Every day | ORAL | 0 refills | Status: AC

## 2019-12-14 NOTE — Telephone Encounter (Signed)
Pt seen on 11/05 for these concerns and had a EKG and Chest x-ray done. Please advise

## 2019-12-15 NOTE — Telephone Encounter (Signed)
I sent I levaquin---  She will need to be seen again if no better

## 2019-12-17 ENCOUNTER — Other Ambulatory Visit: Payer: Self-pay | Admitting: Family Medicine

## 2019-12-17 DIAGNOSIS — K219 Gastro-esophageal reflux disease without esophagitis: Secondary | ICD-10-CM

## 2019-12-18 NOTE — Telephone Encounter (Signed)
She was given levaquin--- if she feels like she is no better she should be seen --- if no app --- can do sat clinic

## 2019-12-18 NOTE — Telephone Encounter (Signed)
Fyi-covid test negative

## 2019-12-22 ENCOUNTER — Other Ambulatory Visit: Payer: Self-pay

## 2019-12-22 ENCOUNTER — Ambulatory Visit (INDEPENDENT_AMBULATORY_CARE_PROVIDER_SITE_OTHER): Payer: Managed Care, Other (non HMO) | Admitting: Family Medicine

## 2019-12-22 ENCOUNTER — Encounter: Payer: Self-pay | Admitting: Family Medicine

## 2019-12-22 VITALS — BP 110/78 | HR 101 | Temp 98.9°F | Resp 20 | Ht 63.0 in | Wt 189.2 lb

## 2019-12-22 DIAGNOSIS — R0609 Other forms of dyspnea: Secondary | ICD-10-CM | POA: Diagnosis not present

## 2019-12-22 DIAGNOSIS — U099 Post covid-19 condition, unspecified: Secondary | ICD-10-CM | POA: Diagnosis not present

## 2019-12-22 MED ORDER — QVAR REDIHALER 40 MCG/ACT IN AERB: 2.0000 | INHALATION_SPRAY | Freq: Two times a day (BID) | RESPIRATORY_TRACT | 2 refills | Status: DC

## 2019-12-22 MED ORDER — ALBUTEROL SULFATE HFA 108 (90 BASE) MCG/ACT IN AERS: 2.0000 | INHALATION_SPRAY | Freq: Four times a day (QID) | RESPIRATORY_TRACT | 0 refills | Status: DC | PRN

## 2019-12-22 NOTE — Patient Instructions (Signed)
Viral Respiratory Infection Test Why am I having this test? A viral respiratory infection test is done to diagnose certain viral infections of the respiratory system. The respiratory system includes the nose, throat, windpipe, and lungs. In this test, a sample of the fluid from the back of your nose and throat, or the nasopharynx, is collected and sent to a lab for testing. The results will show whether a virus is causing your infection. It will also help your health care provider plan for your treatment. You may be given this test if:  You have symptoms of a respiratory infection, including fever, cough, or sore throat.  You are at risk for a respiratory infection because of your work or travel.  You have had contact with someone who is sick, or there are many people who are infected in your community.  It is important to find out if you are infected, even if you do not have symptoms. You do not have to prepare for this test. What is being tested? This test checks for the presence of a virus in your respiratory system. It checks a sample for the genetic material that makes up the virus (viral genetic material). Sometimes the test may also be used to find bacteria. What kind of sample is taken?  A sample of fluid from the back of your nose and throat, also called the nasopharyngeal fluid, is collected using a swab that is attached to a metal wire or plastic tube (nasopharyngeal swab test). What happens during the test? Your health care provider will collect the sample by:  Tilting your head back.  Inserting the swab through one nostril, and along the bottom of your nose, until it reaches the back of your nose (about 2 inches).  Gently rolling the swab to collect nasopharyngeal fluid. If the swab cannot be easily passed through your nose, your health care provider may collect the nasopharyngeal fluid by:  Inserting the swab through your mouth to the back of your throat.  Inserting the  swab halfway inside your nose, to the middle front of the nose. The collected sample will be placed in a culture tube, labeled with your name, and sent to the lab for processing. How are the results reported? Your test results will be reported as either positive or negative. Sometimes, the test results may report that a condition is present when it is not present (false-positive result). This can happen if genetic material remains from a dead virus. Sometimes, the test results may report that a condition is not present when it is present (false-negative result). This can happen if the sample was not collected properly or if there is not enough viral genetic material for the test to detect. What do the results mean?  A positive result means that viral genetic material was found. This also means that you likely have respiratory infection from a virus.  A negative result means that no viral genetic material was found. This also means that you likely do not have a respiratory infection from a virus. If you have a positive result, this test may identify the type of virus or bacteria that you have. The test may indicate whether your infection is from:  Flu (influenza) viruses.  Coronaviruses.  Rhinoviruses.  Adenoviruses.  Respiratory syncytial virus (RSV).  Bacteria such as pertussis (also called whooping cough), chlamydophila, or mycoplasma. Talk with your health care provider about what your results mean. Questions to ask your health care provider Ask your health care provider, or  the department that is doing the test:  When will my results be ready?  How will I get my results?  What are my treatment options?  What other tests do I need?  What are my next steps? Summary  A viral respiratory infection test is done to diagnose certain viral infections in the respiratory system.  This test involves collecting a sample of the fluid from the back of your nose and throat and testing  it in a lab for the presence of the virus.  The sample is collected using a swab attached to a metal wire or plastic tube (nasopharyngeal swab test).  A positive result means that it is likely that you have a viral respiratory infection. A negative result means that it is likely that you do not have a viral respiratory infection.  Talk with your health care provider about what your results mean. This information is not intended to replace advice given to you by your health care provider. Make sure you discuss any questions you have with your health care provider. Document Revised: 08/19/2018 Document Reviewed: 07/22/2018 Elsevier Patient Education  [place].

## 2019-12-23 NOTE — Progress Notes (Signed)
Patient ID: Vicki Le, female    DOB: [birth year]  Age: 54 y.o. MRN: [id number]    Subjective:  Subjective  HPI Vicki Le presents for f/u cough/ sob since covid in December.   No other symptoms   Review of Systems  Constitutional: Negative for appetite change, diaphoresis, fatigue and unexpected weight change.  Eyes: Negative for pain, redness and visual disturbance.  Respiratory: Positive for cough, chest tightness, shortness of breath and wheezing.   Cardiovascular: Negative for chest pain, palpitations and leg swelling.  Endocrine: Negative for cold intolerance, heat intolerance, polydipsia, polyphagia and polyuria.  Genitourinary: Negative for difficulty urinating, dysuria and frequency.  Neurological: Negative for dizziness, light-headedness, numbness and headaches.    History Past Medical History:  Diagnosis Date  . Anxiety   . Chicken pox   . Depression   . GERD (gastroesophageal reflux disease)   . Melanoma ([place])    left buttock    She has a past surgical history that includes skin cancer removal (August 14, 2011); Tonsilectomy/adenoidectomy with myringotomy; Wrist surgery (1991/1192); and Melanoma excision (2013).   Her family history includes Alzheimer's disease in her father; Breast cancer in her maternal grandmother and paternal aunt; Colon polyps in her father; Depression in her father and mother; Hypertension in her father; Kidney Stones in her mother.She reports that she has never smoked. She has never used smokeless tobacco. She reports current alcohol use. She reports that she does not use drugs.  Current Outpatient Medications on File Prior to Visit  Medication Sig Dispense Refill  . Ascorbic Acid (VITAMIN C) 1000 MG tablet Take 1,000 mg by mouth daily.    . benzonatate (TESSALON) 100 MG capsule Take 2 capsules (200 mg total) by mouth 3 (three) times daily as needed for cough. 30 capsule 0  . buPROPion (WELLBUTRIN XL) 150 MG 24 hr tablet Take 3 tablets  (450 mg total) by mouth every morning. 270 tablet 3  . cholecalciferol (VITAMIN D) 1000 UNITS tablet Take 1,000 Units by mouth daily.    . famotidine (PEPCID) 40 MG tablet TAKE 1 TABLET BY MOUTH DAILY 90 tablet 0  . fexofenadine (ALLEGRA ALLERGY) 180 MG tablet Take 1 tablet (180 mg total) by mouth daily.    Marland Kitchen FLUoxetine (PROZAC) 20 MG tablet Take 1 tablet (20 mg total) by mouth daily. 30 tablet 3  . fluticasone (FLONASE) 50 MCG/ACT nasal spray Place 2 sprays into both nostrils daily. 48 g 3  . levocetirizine (XYZAL) 5 MG tablet Take 1 tablet (5 mg total) by mouth every evening. 30 tablet 5  . MIMVEY 1-0.5 MG tablet Take 1 tablet by mouth daily.    . montelukast (SINGULAIR) 10 MG tablet TAKE 1 TABLET BY MOUTH EVERYDAY AT BEDTIME 90 tablet 1  . ondansetron (ZOFRAN ODT) 4 MG disintegrating tablet Take 1 tablet (4 mg total) by mouth every 8 (eight) hours as needed for nausea or vomiting. 20 tablet 0  . pantoprazole (PROTONIX) 40 MG tablet Take 1 tablet (40 mg total) by mouth daily. 90 tablet 3  . zinc gluconate 50 MG tablet Take 50 mg by mouth daily.     No current facility-administered medications on file prior to visit.     Objective:  Objective  Physical Exam Vitals and nursing note reviewed.  Constitutional:      Appearance: She is well-developed.  HENT:     Head: Normocephalic and atraumatic.  Eyes:     Conjunctiva/sclera: Conjunctivae normal.  Neck:     Thyroid:  No thyromegaly.     Vascular: No carotid bruit or JVD.  Cardiovascular:     Rate and Rhythm: Normal rate and regular rhythm.     Heart sounds: Normal heart sounds. No murmur heard.   Pulmonary:     Effort: Pulmonary effort is normal. No respiratory distress.     Breath sounds: Wheezing present. No rales.  Chest:     Chest wall: No tenderness.  Musculoskeletal:     Cervical back: Normal range of motion and neck supple.  Neurological:     Mental Status: She is alert and oriented to person, place, and time.    BP  110/78 (BP Location: Right Arm, Patient Position: Sitting, Cuff Size: Normal)   Pulse (!) 101   Temp 98.9 F (37.2 C) (Oral)   Resp 20   Ht 5\' 3"  (1.6 m)   Wt 189 lb 3.2 oz (85.8 kg)   SpO2 97%   BMI 33.52 kg/m  Wt Readings from Last 3 Encounters:  12/22/19 189 lb 3.2 oz (85.8 kg)  12/11/19 194 lb 9.6 oz (88.3 kg)  10/30/18 202 lb (91.6 kg)     Lab Results  Component Value Date   WBC 10.4 12/11/2019   HGB 14.0 12/11/2019   HCT 42.2 12/11/2019   PLT 345 12/11/2019   GLUCOSE 90 12/11/2019   CHOL 150 12/11/2019   TRIG 191 (H) 12/11/2019   HDL 59 12/11/2019   LDLCALC 65 12/11/2019   ALT 8 12/11/2019   AST 9 (L) 12/11/2019   NA 140 12/11/2019   K 4.4 12/11/2019   CL 103 12/11/2019   CREATININE 0.62 12/11/2019   BUN 6 (L) 12/11/2019   CO2 31 12/11/2019   TSH 1.32 12/11/2019   HGBA1C 5.2 10/12/2014    DG Chest 2 View  Result Date: 12/13/2019 CLINICAL DATA:  54 year old female with cough and congestion. EXAM: CHEST - 2 VIEW COMPARISON:  Chest radiograph dated 10/11/2014. FINDINGS: The heart size and mediastinal contours are within normal limits. Both lungs are clear. The visualized skeletal structures are unremarkable. IMPRESSION: No active cardiopulmonary disease. Electronically Signed   By: Anner Crete M.D.   On: 12/13/2019 16:32     Assessment & Plan:  Plan  I have discontinued Zanyiah Posten. Forsberg's fluconazole. I am also having her start on Qvar RediHaler and albuterol. Additionally, I am having her maintain her cholecalciferol, vitamin C, zinc gluconate, [place], levocetirizine, fexofenadine, buPROPion, fluticasone, montelukast, benzonatate, ondansetron, FLUoxetine, pantoprazole, and famotidine.  Meds ordered this encounter  Medications  . beclomethasone (QVAR REDIHALER) 40 MCG/ACT inhaler    Sig: Inhale 2 puffs into the lungs 2 (two) times daily.    Dispense:  1 each    Refill:  2  . albuterol (VENTOLIN HFA) 108 (90 Base) MCG/ACT inhaler    Sig: Inhale 2 puffs  into the lungs every 6 (six) hours as needed for wheezing or shortness of breath.    Dispense:  8 g    Refill:  0    Problem List Items Addressed This Visit    None    Visit Diagnoses    Post-COVID chronic dyspnea    -  Primary   Relevant Medications   beclomethasone (QVAR REDIHALER) 40 MCG/ACT inhaler   albuterol (VENTOLIN HFA) 108 (90 Base) MCG/ACT inhaler   Other Relevant Orders   Ambulatory referral to Pulmonology      Follow-up: Return if symptoms worsen or fail to improve.  Ann Held, DO

## 2019-12-24 ENCOUNTER — Encounter: Payer: Self-pay | Admitting: Family Medicine

## 2019-12-27 ENCOUNTER — Encounter: Payer: Self-pay | Admitting: Family Medicine

## 2019-12-27 DIAGNOSIS — R059 Cough, unspecified: Secondary | ICD-10-CM

## 2019-12-28 MED ORDER — BENZONATATE 100 MG PO CAPS: 200.0000 mg | ORAL_CAPSULE | Freq: Three times a day (TID) | ORAL | 0 refills | Status: DC | PRN

## 2020-01-04 ENCOUNTER — Institutional Professional Consult (permissible substitution): Payer: Managed Care, Other (non HMO) | Admitting: Pulmonary Disease

## 2020-01-16 ENCOUNTER — Other Ambulatory Visit: Payer: Self-pay | Admitting: Family Medicine

## 2020-01-16 DIAGNOSIS — F418 Other specified anxiety disorders: Secondary | ICD-10-CM

## 2020-01-18 ENCOUNTER — Other Ambulatory Visit: Payer: Self-pay

## 2020-01-18 ENCOUNTER — Ambulatory Visit: Payer: Managed Care, Other (non HMO) | Admitting: Pulmonary Disease

## 2020-01-18 ENCOUNTER — Encounter: Payer: Self-pay | Admitting: Pulmonary Disease

## 2020-01-18 VITALS — BP 130/76 | HR 102 | Temp 98.5°F | Ht 64.0 in | Wt 193.6 lb

## 2020-01-18 DIAGNOSIS — R0602 Shortness of breath: Secondary | ICD-10-CM

## 2020-01-18 DIAGNOSIS — U099 Post covid-19 condition, unspecified: Secondary | ICD-10-CM

## 2020-01-18 MED ORDER — BUDESONIDE-FORMOTEROL FUMARATE 160-4.5 MCG/ACT IN AERO: 2.0000 | INHALATION_SPRAY | Freq: Two times a day (BID) | RESPIRATORY_TRACT | 5 refills | Status: DC

## 2020-01-18 NOTE — Patient Instructions (Signed)
Recheck labs today including CBC with differential, IgE Stop the Qvar and start Symbicort 162 puffs twice daily Continue Singulair  Schedule pulmonary function test Start an exercise regimen and continue weight loss program Follow-up in 1 to 2 months after PFTs.

## 2020-01-18 NOTE — Progress Notes (Signed)
Vicki Le    [id number]    [birth year]  Primary Care Physician:Lowne Cheri Rous Alferd Apa, DO  Referring Physician: Carollee Herter, Alferd Apa, DO [address] [place],  [place] [id number]  Chief complaint: Consult for post COVID-15  HPI: 54 year old with history of allergies, chronic rhinosinusitis GERD, depression, melanoma  Developed COVID-19 in December 2020.  Symptoms manifested mostly as sinus congestion.  She did not require hospitalization Post Covid she has developed intermittent shortness, chest tightness with wheezing. Recently started on Flovent, Singulair.  She cannot tell if this is helping.  She has an albuterol rescue inhaler which does help when she gets short of breath on exertion.  Pets: Cats Occupation: Works in Press photographer for [employer] Exposures: No known exposures.  No mold, hot tub, Jacuzzi.  No feather pillows or comforters Smoking history: Never smoker Travel history: No significant travel history Relevant family history: No significant family issue of lung disease   Outpatient Encounter Medications as of 01/18/2020  Medication Sig  . albuterol (VENTOLIN HFA) 108 (90 Base) MCG/ACT inhaler Inhale 2 puffs into the lungs every 6 (six) hours as needed for wheezing or shortness of breath.  . Ascorbic Acid (VITAMIN C) 1000 MG tablet Take 1,000 mg by mouth daily.  . beclomethasone (QVAR REDIHALER) 40 MCG/ACT inhaler Inhale 2 puffs into the lungs 2 (two) times daily.  . benzonatate (TESSALON) 100 MG capsule Take 2 capsules (200 mg total) by mouth 3 (three) times daily as needed for cough.  Marland Kitchen buPROPion (WELLBUTRIN XL) 150 MG 24 hr tablet Take 3 tablets (450 mg total) by mouth every morning.  . cholecalciferol (VITAMIN D) 1000 UNITS tablet Take 1,000 Units by mouth daily.  . famotidine (PEPCID) 40 MG tablet TAKE 1 TABLET BY MOUTH DAILY  . fexofenadine (ALLEGRA ALLERGY) 180 MG tablet Take 1 tablet (180 mg total) by mouth daily.  . fluticasone  (FLONASE) 50 MCG/ACT nasal spray Place 2 sprays into both nostrils daily.  Marland Kitchen levocetirizine (XYZAL) 5 MG tablet Take 1 tablet (5 mg total) by mouth every evening.  Marland Kitchen MIMVEY 1-0.5 MG tablet Take 1 tablet by mouth daily.  . montelukast (SINGULAIR) 10 MG tablet TAKE 1 TABLET BY MOUTH EVERYDAY AT BEDTIME  . ondansetron (ZOFRAN ODT) 4 MG disintegrating tablet Take 1 tablet (4 mg total) by mouth every 8 (eight) hours as needed for nausea or vomiting.  . pantoprazole (PROTONIX) 40 MG tablet Take 1 tablet (40 mg total) by mouth daily.  Marland Kitchen zinc gluconate 50 MG tablet Take 50 mg by mouth daily.  Marland Kitchen FLUoxetine (PROZAC) 20 MG tablet TAKE 1 TABLET BY MOUTH EVERY DAY (Patient not taking: Reported on 01/18/2020)   No facility-administered encounter medications on file as of 01/18/2020.    Allergies as of 01/18/2020 - Review Complete 01/18/2020  Allergen Reaction Noted  . Vicodin [hydrocodone-acetaminophen] Nausea And Vomiting 06/01/2013    Past Medical History:  Diagnosis Date  . Anxiety   . Chicken pox   . Depression   . GERD (gastroesophageal reflux disease)   . Melanoma ([place])    left buttock    Past Surgical History:  Procedure Laterality Date  . MELANOMA EXCISION  2013  . skin cancer removal  August 14, 2011   2 lymph nodes removed as well  . TONSILECTOMY/ADENOIDECTOMY WITH MYRINGOTOMY    . WRIST SURGERY  1991/1192   ganglion cyst r wrist    Family History  Problem Relation Age of Onset  .  Alzheimer's disease Father   . Colon polyps Father        suspicious polyps and 8 in of colon removed ? colon cancer   . Depression Father   . Hypertension Father   . Depression Mother   . Kidney Stones Mother   . Breast cancer Maternal Grandmother   . Breast cancer Paternal Aunt     Social History   Socioeconomic History  . Marital status: Married    Spouse name: Not on file  . Number of children: 0  . Years of education: Not on file  . Highest education level: Not on file  Occupational  History  . Occupation: Marketing executive  Tobacco Use  . Smoking status: Never Smoker  . Smokeless tobacco: Never Used  Vaping Use  . Vaping Use: Never used  Substance and Sexual Activity  . Alcohol use: Yes    Comment: occassionally  . Drug use: No  . Sexual activity: Not on file  Other Topics Concern  . Not on file  Social History Narrative  . Not on file   Social Determinants of Health   Financial Resource Strain: Not on file  Food Insecurity: Not on file  Transportation Needs: Not on file  Physical Activity: Not on file  Stress: Not on file  Social Connections: Not on file  Intimate Partner Violence: Not on file    Review of systems: Review of Systems  Constitutional: Negative for fever and chills.  HENT: Negative.   Eyes: Negative for blurred vision.  Respiratory: as per HPI  Cardiovascular: Negative for chest pain and palpitations.  Gastrointestinal: Negative for vomiting, diarrhea, blood per rectum. Genitourinary: Negative for dysuria, urgency, frequency and hematuria.  Musculoskeletal: Negative for myalgias, back pain and joint pain.  Skin: Negative for itching and rash.  Neurological: Negative for dizziness, tremors, focal weakness, seizures and loss of consciousness.  Endo/Heme/Allergies: Negative for environmental allergies.  Psychiatric/Behavioral: Negative for depression, suicidal ideas and hallucinations.  All other systems reviewed and are negative.  Physical Exam: Blood pressure 130/76, pulse (!) 102, temperature 98.5 F (36.9 C), temperature source Skin, height 5\' 4"  (1.626 m), weight 193 lb 9.6 oz (87.8 kg), SpO2 98 %. Gen:      No acute distress HEENT:  EOMI, sclera anicteric Neck:     No masses; no thyromegaly Lungs:    Clear to auscultation bilaterally; normal respiratory effort CV:         Regular rate and rhythm; no murmurs Abd:      + bowel sounds; soft, non-tender; no palpable masses, no distension Ext:    No edema; adequate peripheral  perfusion Skin:      Warm and dry; no rash Neuro: alert and oriented x 3 Psych: normal mood and affect  Data Reviewed: Imaging: Chest x-ray 12/11/2019-no active cardiopulmonary disease.  I have reviewed the images personally.  PFTs:  Labs: CBC 12/11/2019-WBC 10.4, eos 1.4%, absolute eosinophil count 146  Assessment:  Post COVID-19 Chief complaint is cough with congestion, wheezing.  Symptoms seem to be more airway inflammation, reactive airway disease then ongoing pneumonitis or interstitial lung disease. Recent chest x-ray shows clear lungs.  Check CBC differential and IgE Stop Qvar and start Symbicort 160.  2 puffs twice daily Continue Singulair Schedule pulmonary function test for evaluation of the lungs Start exercise program and weight loss  Plan/Recommendations: CBC, IgE Symbicort instead of Qvar PFTs Singulair Exercise regimen  Marshell Garfinkel MD [facility] 01/18/2020, 4:20 PM  CC: Lowne  Koren Shiver, *

## 2020-01-19 LAB — CBC WITH DIFFERENTIAL/PLATELET
Basophils Absolute: 0.1 10*3/uL (ref 0.0–0.1)
Basophils Relative: 0.8 % (ref 0.0–3.0)
Eosinophils Absolute: 0.2 10*3/uL (ref 0.0–0.7)
Eosinophils Relative: 2.8 % (ref 0.0–5.0)
HCT: 39.4 % (ref 36.0–46.0)
Hemoglobin: 13.1 g/dL (ref 12.0–15.0)
Lymphocytes Relative: 29.3 % (ref 12.0–46.0)
Lymphs Abs: 2.6 10*3/uL (ref 0.7–4.0)
MCHC: 33.2 g/dL (ref 30.0–36.0)
MCV: 93.6 fl (ref 78.0–100.0)
Monocytes Absolute: 0.7 10*3/uL (ref 0.1–1.0)
Monocytes Relative: 7.8 % (ref 3.0–12.0)
Neutro Abs: 5.2 10*3/uL (ref 1.4–7.7)
Neutrophils Relative %: 59.3 % (ref 43.0–77.0)
Platelets: 301 10*3/uL (ref 150.0–400.0)
RBC: 4.21 Mil/uL (ref 3.87–5.11)
RDW: 12.7 % (ref 11.5–15.5)
WBC: 8.8 10*3/uL (ref 4.0–10.5)

## 2020-01-19 LAB — IGE: IgE (Immunoglobulin E), Serum: 49 kU/L (ref <=114)

## 2020-01-19 NOTE — Telephone Encounter (Signed)
Good afternoon, Please see patient comment regarding labs drawn yesterday.  Please advise.  Thank you.

## 2020-01-21 NOTE — Telephone Encounter (Signed)
Labs were drawn to check for inflammation from asthma and allergies.  They were normal with no inflammation.

## 2020-02-23 ENCOUNTER — Institutional Professional Consult (permissible substitution): Payer: Self-pay | Admitting: Internal Medicine

## 2020-03-03 ENCOUNTER — Encounter: Payer: Self-pay | Admitting: Family Medicine

## 2020-03-03 ENCOUNTER — Ambulatory Visit: Payer: Self-pay | Admitting: Pulmonary Disease

## 2020-03-03 ENCOUNTER — Other Ambulatory Visit: Payer: Self-pay | Admitting: Family Medicine

## 2020-03-03 DIAGNOSIS — R110 Nausea: Secondary | ICD-10-CM

## 2020-03-03 DIAGNOSIS — R059 Cough, unspecified: Secondary | ICD-10-CM

## 2020-03-03 DIAGNOSIS — R0609 Other forms of dyspnea: Secondary | ICD-10-CM

## 2020-03-03 MED ORDER — ONDANSETRON 4 MG PO TBDP: 4.0000 mg | ORAL_TABLET | Freq: Three times a day (TID) | ORAL | 0 refills | Status: DC | PRN

## 2020-03-03 MED ORDER — ALBUTEROL SULFATE HFA 108 (90 BASE) MCG/ACT IN AERS: 2.0000 | INHALATION_SPRAY | Freq: Four times a day (QID) | RESPIRATORY_TRACT | 0 refills | Status: DC | PRN

## 2020-03-03 MED ORDER — BENZONATATE 100 MG PO CAPS: 200.0000 mg | ORAL_CAPSULE | Freq: Three times a day (TID) | ORAL | 0 refills | Status: DC | PRN

## 2020-03-03 NOTE — Telephone Encounter (Signed)
Please advise 

## 2020-03-09 ENCOUNTER — Ambulatory Visit (HOSPITAL_BASED_OUTPATIENT_CLINIC_OR_DEPARTMENT_OTHER)
Admission: RE | Admit: 2020-03-09 | Discharge: 2020-03-09 | Disposition: A | Discharge status: Home / Self Care | Payer: Managed Care, Other (non HMO) | Source: Ambulatory Visit | Attending: Medical | Admitting: Medical

## 2020-03-09 ENCOUNTER — Other Ambulatory Visit: Payer: Self-pay

## 2020-03-09 ENCOUNTER — Ambulatory Visit: Payer: Managed Care, Other (non HMO) | Admitting: Medical

## 2020-03-09 VITALS — BP 143/72 | HR 101 | Resp 18 | Ht 64.0 in | Wt 194.0 lb

## 2020-03-09 DIAGNOSIS — R1013 Epigastric pain: Secondary | ICD-10-CM | POA: Diagnosis not present

## 2020-03-09 DIAGNOSIS — K5900 Constipation, unspecified: Secondary | ICD-10-CM | POA: Insufficient documentation

## 2020-03-09 LAB — COMPREHENSIVE METABOLIC PANEL
ALT: 11 U/L (ref 0–35)
AST: 11 U/L (ref 0–37)
Albumin: 4.2 g/dL (ref 3.5–5.2)
Alkaline Phosphatase: 49 U/L (ref 39–117)
BUN: 7 mg/dL (ref 6–23)
CO2: 29 mEq/L (ref 19–32)
Calcium: 9.6 mg/dL (ref 8.4–10.5)
Chloride: 104 mEq/L (ref 96–112)
Creatinine, Ser: 0.64 mg/dL (ref 0.40–1.20)
GFR: 99.57 mL/min (ref >60.00)
Glucose, Bld: 92 mg/dL (ref 70–99)
Potassium: 5.1 mEq/L (ref 3.5–5.1)
Sodium: 139 mEq/L (ref 135–145)
Total Bilirubin: 0.4 mg/dL (ref 0.2–1.2)
Total Protein: 6.4 g/dL (ref 6.0–8.3)

## 2020-03-09 LAB — CBC WITH DIFFERENTIAL/PLATELET
Basophils Absolute: 0 10*3/uL (ref 0.0–0.1)
Basophils Relative: 0.5 % (ref 0.0–3.0)
Eosinophils Absolute: 0.2 10*3/uL (ref 0.0–0.7)
Eosinophils Relative: 2.3 % (ref 0.0–5.0)
HCT: 41.6 % (ref 36.0–46.0)
Hemoglobin: 13.8 g/dL (ref 12.0–15.0)
Lymphocytes Relative: 22.7 % (ref 12.0–46.0)
Lymphs Abs: 2.1 10*3/uL (ref 0.7–4.0)
MCHC: 33.2 g/dL (ref 30.0–36.0)
MCV: 94.7 fl (ref 78.0–100.0)
Monocytes Absolute: 0.6 10*3/uL (ref 0.1–1.0)
Monocytes Relative: 7 % (ref 3.0–12.0)
Neutro Abs: 6.2 10*3/uL (ref 1.4–7.7)
Neutrophils Relative %: 67.5 % (ref 43.0–77.0)
Platelets: 279 10*3/uL (ref 150.0–400.0)
RBC: 4.39 Mil/uL (ref 3.87–5.11)
RDW: 13.4 % (ref 11.5–15.5)
WBC: 9.2 10*3/uL (ref 4.0–10.5)

## 2020-03-09 LAB — LIPASE: Lipase: 43 U/L (ref 11.0–59.0)

## 2020-03-09 NOTE — Progress Notes (Signed)
   Subjective:    Patient ID: Vicki Le, female    DOB: 1965/08/17, 55 y.o.   MRN: [id number]  HPI  Pt in evaluation.  Pt states her stomach feels bloated since monday. Pt notes has hx of h pylori. She was treated with 14 day antibiotic course.  Recent mild sour stomach. No fever, no fills,no nausea and no vomiting. He stomach feels bloated after eating but not having pain.    Pt had negative US abdomen in past.  IMPRESSION: Multiple tiny echo densities with posterior shadowing in the gallbladder. These are non mobile. These are most likely non mobile gallstones. No evidence of cholecystitis. Common bile duct is not visualized. No focal hepatic abnormality identified.   Pt had colonoscopy in past.  Pt has some mild loose stools.    Hx of egd with erythematous mucosa in 2020.  Hx of colonoscopy. Three 3 to 7 mm polyps in the ascending colon, removed with a cold snare. Resected and retrieved. - Two 3 to 4 mm polyps in the transverse colon, removed with a cold snare. Resected and retrieved. - Diverticulosis in the transverse colon and in the left colon. - Internal hemorrhoids. - The examination was otherwise normal. - Biopsies were taken with a cold forceps from the right colon, left colon and transverse colon for evaluation of microscopic colitis.   Pt does take protonix and famotadine.     Review of Systems  Constitutional: Negative for chills, fatigue and fever.  HENT: Negative for congestion, drooling, ear pain, facial swelling, hearing loss and postnasal drip.   Respiratory: Negative for cough, chest tightness, shortness of breath and wheezing.   Cardiovascular: Negative for chest pain and palpitations.  Gastrointestinal: Positive for abdominal pain and constipation. Negative for abdominal distention, nausea and vomiting.       Possible constipation. Small loose stool this am but not normal since Monday.  Genitourinary: Negative for dysuria and frequency.   Musculoskeletal: Negative for back pain, joint swelling, myalgias and neck stiffness.  Skin: Negative for rash.  Neurological: Negative for dizziness, speech difficulty, weakness, numbness and headaches.  Hematological: Negative for adenopathy. Does not bruise/bleed easily.  Psychiatric/Behavioral: Negative for behavioral problems, decreased concentration, dysphoric mood, hallucinations, sleep disturbance and suicidal ideas. The patient is not nervous/anxious.        Objective:   Physical Exam  General Appearance- Not in acute distress.  HEENT Eyes- Scleraeral/Conjuntiva-bilat- Not Yellow. Mouth & Throat- Normal.  Chest and Lung Exam Auscultation: Breath sounds:-Normal. Adventitious sounds:- No Adventitious sounds.  Cardiovascular Auscultation:Rythm - Regular. Heart Sounds -Normal heart sounds.  Abdomen Inspection:-Inspection Normal.  Palpation/Perucssion: Palpation and Percussion of the abdomen reveal- mild epigastric Tender, left lowere quadrant faint tender. No Rebound tenderness, No rigidity(Guarding) and No Palpable abdominal masses.  Liver:-Normal.  Spleen:- Normal.   .     Assessment & Plan:  For your epigastric pain continue protonix and famotadine.   Labs done today cbc, cmp and lipase.  H. Pylori pending. Testing discussed with possible negative result due to med.   If negative and symptoms persist consider igm, iga and igg antibody studies.  Get xray abd today to assess stool burden .  GI cocktail today.  Discussed differential diagnosis.   Follow up 2 weeks or as needed  Time spent with patient today was  40 + minutes which consisted of chart review, discussing diagnoses, diferential work up treatment and documentation.  Mackie Pai, PA-C

## 2020-03-09 NOTE — Patient Instructions (Signed)
For your epigastric pain continue protonix and famotadine.   Labs done today cbc, cmp and lipase.  H. Pylori pending. Testing discussed with possible negative result due to med.   If negative and symptoms persist consider igm, iga and igg antibody studies.  Get xray abd today to assess stool burden .  GI cocktail today.  Discussed differential diagnosis.   Follow up 2 weeks or as needed

## 2020-03-10 ENCOUNTER — Encounter: Payer: Self-pay | Admitting: Medical

## 2020-03-10 LAB — H. PYLORI BREATH TEST: H. pylori Breath Test: NOT DETECTED

## 2020-03-16 ENCOUNTER — Other Ambulatory Visit: Payer: Self-pay | Admitting: Family Medicine

## 2020-03-16 DIAGNOSIS — K219 Gastro-esophageal reflux disease without esophagitis: Secondary | ICD-10-CM

## 2020-03-17 MED ORDER — FAMOTIDINE 40 MG PO TABS: 40.0000 mg | ORAL_TABLET | Freq: Every day | ORAL | 0 refills | Status: DC

## 2020-03-26 ENCOUNTER — Other Ambulatory Visit: Payer: Self-pay | Admitting: Family Medicine

## 2020-03-26 DIAGNOSIS — U099 Post covid-19 condition, unspecified: Secondary | ICD-10-CM

## 2020-05-14 ENCOUNTER — Other Ambulatory Visit: Payer: Self-pay | Admitting: Family Medicine

## 2020-05-14 DIAGNOSIS — J302 Other seasonal allergic rhinitis: Secondary | ICD-10-CM

## 2020-05-17 ENCOUNTER — Other Ambulatory Visit: Payer: Self-pay | Admitting: Family Medicine

## 2020-05-17 DIAGNOSIS — R110 Nausea: Secondary | ICD-10-CM

## 2020-05-18 MED ORDER — ONDANSETRON 4 MG PO TBDP: 4.0000 mg | ORAL_TABLET | Freq: Three times a day (TID) | ORAL | 0 refills | Status: DC | PRN

## 2020-06-22 ENCOUNTER — Other Ambulatory Visit: Payer: Self-pay | Admitting: Family Medicine

## 2020-06-22 DIAGNOSIS — K219 Gastro-esophageal reflux disease without esophagitis: Secondary | ICD-10-CM

## 2020-06-22 MED ORDER — FAMOTIDINE 40 MG PO TABS: 40.0000 mg | ORAL_TABLET | Freq: Every day | ORAL | 0 refills | Status: DC

## 2020-09-05 ENCOUNTER — Other Ambulatory Visit: Payer: Self-pay | Admitting: Family Medicine

## 2020-09-05 DIAGNOSIS — K219 Gastro-esophageal reflux disease without esophagitis: Secondary | ICD-10-CM

## 2020-10-01 ENCOUNTER — Other Ambulatory Visit: Payer: Self-pay | Admitting: Family Medicine

## 2020-10-01 DIAGNOSIS — R110 Nausea: Secondary | ICD-10-CM

## 2020-10-01 DIAGNOSIS — R059 Cough, unspecified: Secondary | ICD-10-CM

## 2020-10-03 ENCOUNTER — Other Ambulatory Visit: Payer: Self-pay | Admitting: Family Medicine

## 2020-10-03 DIAGNOSIS — F32A Depression, unspecified: Secondary | ICD-10-CM

## 2020-11-06 ENCOUNTER — Other Ambulatory Visit: Payer: Self-pay | Admitting: Family Medicine

## 2020-11-06 DIAGNOSIS — F32A Depression, unspecified: Secondary | ICD-10-CM

## 2020-12-06 ENCOUNTER — Other Ambulatory Visit: Payer: Self-pay | Admitting: Family Medicine

## 2020-12-06 DIAGNOSIS — F32A Depression, unspecified: Secondary | ICD-10-CM

## 2020-12-10 ENCOUNTER — Other Ambulatory Visit: Payer: Self-pay | Admitting: Family Medicine

## 2020-12-10 DIAGNOSIS — F32A Depression, unspecified: Secondary | ICD-10-CM

## 2020-12-12 ENCOUNTER — Other Ambulatory Visit: Payer: Self-pay

## 2020-12-12 ENCOUNTER — Telehealth (INDEPENDENT_AMBULATORY_CARE_PROVIDER_SITE_OTHER): Payer: Managed Care, Other (non HMO) | Admitting: Internal Medicine

## 2020-12-12 ENCOUNTER — Encounter: Payer: Self-pay | Admitting: Internal Medicine

## 2020-12-12 VITALS — BP 115/65 | HR 86 | Temp 99.4°F | Ht 64.0 in | Wt 196.0 lb

## 2020-12-12 DIAGNOSIS — B349 Viral infection, unspecified: Secondary | ICD-10-CM

## 2020-12-12 LAB — POCT INFLUENZA A/B
Influenza A, POC: NEGATIVE
Influenza B, POC: NEGATIVE

## 2020-12-12 LAB — POCT RAPID STREP A (OFFICE): Rapid Strep A Screen: NEGATIVE

## 2020-12-12 NOTE — Progress Notes (Signed)
Subjective:    Patient ID: Vicki Le, female    DOB: Dec 29, 1965, 55 y.o.   MRN: [id number]  DOS:  12/12/2020 Type of visit - description: Acute.  Started as a virtual visit but later on converted to in person.  Symptoms started yesterday: Hoarseness, sore throat at times intense. ++ postnasal dripping. Also feels fatigued and had a temperature of 99.4.  She denies nausea, vomiting, diarrhea. No headache or rash. Slt achy   Review of Systems See above   Past Medical History:  Diagnosis Date   Anxiety    Chicken pox    Depression    GERD (gastroesophageal reflux disease)    Melanoma ([facility])    left buttock    Past Surgical History:  Procedure Laterality Date   MELANOMA EXCISION  2013   skin cancer removal  August 14, 2011   2 lymph nodes removed as well   TONSILECTOMY/ADENOIDECTOMY WITH MYRINGOTOMY     WRIST SURGERY  1991/1192   ganglion cyst r wrist    Allergies as of 12/12/2020       Reactions   Vicodin [hydrocodone-acetaminophen] Nausea And Vomiting        Medication List        Accurate as of December 12, 2020 11:59 PM. If you have any questions, ask your nurse or doctor.          albuterol 108 (90 Base) MCG/ACT inhaler Commonly known as: VENTOLIN HFA TAKE 2 PUFFS BY MOUTH EVERY 6 HOURS AS NEEDED FOR WHEEZE OR SHORTNESS OF BREATH   benzonatate 100 MG capsule Commonly known as: TESSALON Take 2 capsules (200 mg total) by mouth 3 (three) times daily as needed for cough.   budesonide-formoterol 160-4.5 MCG/ACT inhaler Commonly known as: Symbicort Inhale 2 puffs into the lungs 2 (two) times daily.   buPROPion 150 MG 24 hr tablet Commonly known as: WELLBUTRIN XL Take 3 tablets (450 mg total) by mouth daily. Pt needs office visit for more refills   cholecalciferol 1000 units tablet Commonly known as: VITAMIN D Take 1,000 Units by mouth daily.   famotidine 40 MG tablet Commonly known as: PEPCID TAKE 1 TABLET (40 MG TOTAL) BY MOUTH DAILY.    fexofenadine 180 MG tablet Commonly known as: Allegra Allergy Take 1 tablet (180 mg total) by mouth daily.   FLUoxetine 20 MG tablet Commonly known as: PROZAC TAKE 1 TABLET BY MOUTH EVERY DAY   fluticasone 50 MCG/ACT nasal spray Commonly known as: FLONASE Place 2 sprays into both nostrils daily.   levocetirizine 5 MG tablet Commonly known as: Xyzal Take 1 tablet (5 mg total) by mouth every evening.   Mimvey 1-0.5 MG tablet Generic drug: estradiol-norethindrone Take 1 tablet by mouth daily.   montelukast 10 MG tablet Commonly known as: SINGULAIR Take 1 tablet (10 mg total) by mouth at bedtime.   ondansetron 4 MG disintegrating tablet Commonly known as: Zofran ODT Take 1 tablet (4 mg total) by mouth every 8 (eight) hours as needed for nausea or vomiting.   pantoprazole 40 MG tablet Commonly known as: PROTONIX TAKE 1 TABLET (40 MG TOTAL) BY MOUTH DAILY.   Qvar RediHaler 40 MCG/ACT inhaler Generic drug: beclomethasone Inhale 2 puffs into the lungs 2 (two) times daily.   vitamin C 1000 MG tablet Take 1,000 mg by mouth daily.   zinc gluconate 50 MG tablet Take 50 mg by mouth daily.           Objective:   Physical Exam BP 115/65  Pulse 86   Temp 99.4 F (37.4 C)   Ht 5\' 4"  (8.757 m)   Wt 196 lb (88.9 kg)   BMI 33.64 kg/m  General:   Well developed, NAD, BMI noted. HEENT:  Normocephalic . Face symmetric, atraumatic. Throat: Symmetric, minimal redness, no white patches. TMs normal. Nose not congested, sinuses non-TTP Lungs:  CTA B Normal respiratory effort, no intercostal retractions, no accessory muscle use. Heart: RRR,  no murmur.  Lower extremities: no pretibial edema bilaterally  Skin: Not pale. Not jaundice Neurologic:  alert & oriented X3.  Speech normal, gait appropriate for age and unassisted Psych--  Cognition and judgment appear intact.  Cooperative with normal attention span and concentration.  Behavior appropriate. No anxious or  depressed appearing.      Assessment    55 year old female, presents with:  Viral syndrome: As described above. H/o COVID December 2020, since then is having respiratory symptoms and using inhalers successfully. Has not gotten the COVID-vaccine under the advice of PCP, apparently had some sort of reaction when she had COVID. At home COVID test negative. Flu test and rapid strep test here are negative. Plan: Supportive treatment, recheck COVID test in a day or 2.  Call if symptoms severe   This visit occurred during the SARS-CoV-2 public health emergency.  Safety protocols were in place, including screening questions prior to the visit, additional usage of staff PPE, and extensive cleaning of exam room while observing appropriate contact time as indicated for disinfecting solutions.

## 2020-12-12 NOTE — Patient Instructions (Signed)
Recheck your COVID test tomorrow or the next day.  Rest, fluids , tylenol  For cough:  Take Mucinex DM twice a day as needed until better  For nasal congestion:  Use OTC  Flonase or Nasacort (generics are okay and less expensive): 2 nasal sprays on each side of the nose in the morning until you feel better  Use OTC Astepro 2 nasal sprays on each side of the nose twice daily until better  Avoid decongestants such as  Pseudoephedrine or phenylephrine     Call if not gradually better over the next  10 days  Call anytime if the symptoms are severe, you have high fever, short of breath, chest pain

## 2020-12-14 ENCOUNTER — Other Ambulatory Visit: Payer: Self-pay | Admitting: Internal Medicine

## 2020-12-14 ENCOUNTER — Encounter: Payer: Self-pay | Admitting: Internal Medicine

## 2020-12-14 DIAGNOSIS — R059 Cough, unspecified: Secondary | ICD-10-CM

## 2020-12-14 MED ORDER — BENZONATATE 100 MG PO CAPS: 200.0000 mg | ORAL_CAPSULE | Freq: Three times a day (TID) | ORAL | 0 refills | Status: DC | PRN

## 2020-12-14 MED ORDER — AZITHROMYCIN 250 MG PO TABS: ORAL_TABLET | ORAL | 0 refills | Status: DC

## 2020-12-14 NOTE — Telephone Encounter (Signed)
Please advise on new sx and poss refill

## 2020-12-15 ENCOUNTER — Encounter: Payer: Self-pay | Admitting: Family Medicine

## 2020-12-15 ENCOUNTER — Other Ambulatory Visit: Payer: Self-pay

## 2020-12-15 ENCOUNTER — Ambulatory Visit: Payer: Managed Care, Other (non HMO) | Admitting: Family Medicine

## 2020-12-15 VITALS — BP 110/82 | HR 105 | Temp 99.0°F | Resp 18 | Ht 64.0 in | Wt 192.0 lb

## 2020-12-15 DIAGNOSIS — J069 Acute upper respiratory infection, unspecified: Secondary | ICD-10-CM

## 2020-12-15 DIAGNOSIS — F419 Anxiety disorder, unspecified: Secondary | ICD-10-CM

## 2020-12-15 DIAGNOSIS — U099 Post covid-19 condition, unspecified: Secondary | ICD-10-CM

## 2020-12-15 DIAGNOSIS — F32A Depression, unspecified: Secondary | ICD-10-CM

## 2020-12-15 DIAGNOSIS — R051 Acute cough: Secondary | ICD-10-CM

## 2020-12-15 DIAGNOSIS — J3089 Other allergic rhinitis: Secondary | ICD-10-CM | POA: Insufficient documentation

## 2020-12-15 DIAGNOSIS — R0609 Other forms of dyspnea: Secondary | ICD-10-CM

## 2020-12-15 DIAGNOSIS — K219 Gastro-esophageal reflux disease without esophagitis: Secondary | ICD-10-CM

## 2020-12-15 DIAGNOSIS — J302 Other seasonal allergic rhinitis: Secondary | ICD-10-CM

## 2020-12-15 MED ORDER — FAMOTIDINE 40 MG PO TABS: 40.0000 mg | ORAL_TABLET | Freq: Every day | ORAL | 0 refills | Status: DC

## 2020-12-15 MED ORDER — MONTELUKAST SODIUM 10 MG PO TABS: 10.0000 mg | ORAL_TABLET | Freq: Every day | ORAL | 1 refills | Status: DC

## 2020-12-15 MED ORDER — FLUTICASONE PROPIONATE 50 MCG/ACT NA SUSP: 2.0000 | Freq: Every day | NASAL | 3 refills | Status: DC

## 2020-12-15 MED ORDER — PANTOPRAZOLE SODIUM 40 MG PO TBEC: 40.0000 mg | DELAYED_RELEASE_TABLET | Freq: Every day | ORAL | 0 refills | Status: DC

## 2020-12-15 MED ORDER — ALBUTEROL SULFATE HFA 108 (90 BASE) MCG/ACT IN AERS: INHALATION_SPRAY | RESPIRATORY_TRACT | 2 refills | Status: DC

## 2020-12-15 MED ORDER — FAMOTIDINE 40 MG PO TABS: 40.0000 mg | ORAL_TABLET | Freq: Every day | ORAL | 3 refills | Status: DC

## 2020-12-15 MED ORDER — PANTOPRAZOLE SODIUM 40 MG PO TBEC: 40.0000 mg | DELAYED_RELEASE_TABLET | Freq: Every day | ORAL | 3 refills | Status: DC

## 2020-12-15 MED ORDER — PROMETHAZINE-DM 6.25-15 MG/5ML PO SYRP: 5.0000 mL | ORAL_SOLUTION | Freq: Four times a day (QID) | ORAL | 0 refills | Status: DC | PRN

## 2020-12-15 MED ORDER — BUPROPION HCL ER (XL) 150 MG PO TB24: 450.0000 mg | ORAL_TABLET | Freq: Every day | ORAL | 0 refills | Status: DC

## 2020-12-15 NOTE — Assessment & Plan Note (Signed)
Stable  Refill protonix and pepcid

## 2020-12-15 NOTE — Assessment & Plan Note (Signed)
con't wellbutrin

## 2020-12-15 NOTE — Patient Instructions (Signed)
Food Choices for Gastroesophageal Reflux Disease, Adult °When you have gastroesophageal reflux disease (GERD), the foods you eat and your eating habits are very important. Choosing the right foods can help ease the discomfort of GERD. Consider working with a dietitian to help you make healthy food choices. °What are tips for following this plan? °Reading food labels °Look for foods that are low in saturated fat. Foods that have less than 5% of daily value (DV) of fat and 0 g of trans fats may help with your symptoms. °Cooking °Cook foods using methods other than frying. This may include baking, steaming, grilling, or broiling. These are all methods that do not need a lot of fat for cooking. °To add flavor, try to use herbs that are low in spice and acidity. °Meal planning ° °Choose healthy foods that are low in fat, such as fruits, vegetables, whole grains, low-fat dairy products, lean meats, fish, and poultry. °Eat frequent, small meals instead of three large meals each day. Eat your meals slowly, in a relaxed setting. Avoid bending over or lying down until 2-3 hours after eating. °Limit high-fat foods such as fatty meats or fried foods. °Limit your intake of fatty foods, such as oils, butter, and shortening. °Avoid the following as told by your health care provider: °Foods that cause symptoms. These may be different for different people. Keep a food diary to keep track of foods that cause symptoms. °Alcohol. °Drinking large amounts of liquid with meals. °Eating meals during the 2-3 hours before bed. °Lifestyle °Maintain a healthy weight. Ask your health care provider what weight is healthy for you. If you need to lose weight, work with your health care provider to do so safely. °Exercise for at least 30 minutes on 5 or more days each week, or as told by your health care provider. °Avoid wearing clothes that fit tightly around your waist and chest. °Do not use any products that contain nicotine or tobacco. These  products include cigarettes, chewing tobacco, and vaping devices, such as e-cigarettes. If you need help quitting, ask your health care provider. °Sleep with the head of your bed raised. Use a wedge under the mattress or blocks under the bed frame to raise the head of the bed. °Chew sugar-free gum after mealtimes. °What foods should I eat? °Eat a healthy, well-balanced diet of fruits, vegetables, whole grains, low-fat dairy products, lean meats, fish, and poultry. Each person is different. Foods that may trigger symptoms in one person may not trigger any symptoms in another person. Work with your health care provider to identify foods that are safe for you. °The items listed above may not be a complete list of recommended foods and beverages. Contact a dietitian for more information. °What foods should I avoid? °Limiting some of these foods may help manage the symptoms of GERD. Everyone is different. Consult a dietitian or your health care provider to help you identify the exact foods to avoid, if any. °Fruits °Any fruits prepared with added fat. Any fruits that cause symptoms. For some people this may include citrus fruits, such as oranges, grapefruit, pineapple, and lemons. °Vegetables °Deep-fried vegetables. French fries. Any vegetables prepared with added fat. Any vegetables that cause symptoms. For some people, this may include tomatoes and tomato products, chili peppers, onions and garlic, and horseradish. °Grains °Pastries or quick breads with added fat. °Meats and other proteins °High-fat meats, such as fatty beef or pork, hot dogs, ribs, ham, sausage, salami, and bacon. Fried meat or protein, including fried   fish and fried chicken. Nuts and nut butters, in large amounts. °Dairy °Whole milk and chocolate milk. Sour cream. Cream. Ice cream. Cream cheese. Milkshakes. °Fats and oils °Butter. Margarine. Shortening. Ghee. °Beverages °Coffee and tea, with or without caffeine. Carbonated beverages. Sodas. Energy  drinks. Fruit juice made with acidic fruits, such as orange or grapefruit. Tomato juice. Alcoholic drinks. °Sweets and desserts °Chocolate and cocoa. Donuts. °Seasonings and condiments °Pepper. Peppermint and spearmint. Added salt. Any condiments, herbs, or seasonings that cause symptoms. For some people, this may include curry, hot sauce, or vinegar-based salad dressings. °The items listed above may not be a complete list of foods and beverages to avoid. Contact a dietitian for more information. °Questions to ask your health care provider °Diet and lifestyle changes are usually the first steps that are taken to manage symptoms of GERD. If diet and lifestyle changes do not improve your symptoms, talk with your health care provider about taking medicines. °Where to find more information °[employer] G[employer]intestinal [employer]: aboutgerd.org °Summary °When you have gastroesophageal reflux disease (GERD), food and lifestyle choices may be very helpful in easing the discomfort of GERD. °Eat frequent, small meals instead of three large meals each day. Eat your meals slowly, in a relaxed setting. Avoid bending over or lying down until 2-3 hours after eating. °Limit high-fat foods such as fatty meats or fried foods. °This information is not intended to replace advice given to you by your health care provider. Make sure you discuss any questions you have with your health care provider. °Document Revised: 08/03/2019 Document Reviewed: 08/03/2019 °Elsevier Patient Education © 2022 [employer] ° °

## 2020-12-15 NOTE — Progress Notes (Signed)
Subjective:   By signing my name below, I, [name], attest that this documentation has been prepared under the direction and in the presence of Dr. Roma Schanz, DO. 12/15/2020    Patient ID: Vicki Le, female    DOB: 1965/09/23, 55 y.o.   MRN: [id number]  Chief Complaint  Patient presents with   Gastroesophageal Reflux   Follow-up    Gastroesophageal Reflux She complains of coughing. She reports no abdominal pain, no chest pain or no nausea.  Patient is in today for a office visit.    Refills- She is requesting a refill for 150 mg Wellbutrin 3x daily PO, 40 mg Pepcid daily PO, 10 mg Singulair daily PO, 40 mg Protonix daily PO, Flonase nasal spray, albuterol inhaler.  Sore throat- She reports having a sore throat and cough in the beginning of this week and seen another provider to manage it. She was prescribed tessalon Perles and azithromycin and reports doing well while taking them. She has not used her Symbicort inhaler recently due to her breathing improving and running out of it. She continues using albuterol inhaler as needed. She is also taking mucinex PRN to manage her symptoms. She continues coughing at this time.   Past Medical History:  Diagnosis Date   Anxiety    Chicken pox    Depression    GERD (gastroesophageal reflux disease)    Melanoma ([facility])    left buttock    Past Surgical History:  Procedure Laterality Date   MELANOMA EXCISION  2013   skin cancer removal  August 14, 2011   2 lymph nodes removed as well   TONSILECTOMY/ADENOIDECTOMY WITH MYRINGOTOMY     WRIST SURGERY  1991/1192   ganglion cyst r wrist    Family History  Problem Relation Age of Onset   Alzheimer's disease Father    Colon polyps Father        suspicious polyps and 8 in of colon removed ? colon cancer    Depression Father    Hypertension Father    Depression Mother    Kidney Stones Mother    Breast cancer Maternal Grandmother    Breast cancer Paternal Aunt      Social History   Socioeconomic History   Marital status: Married    Spouse name: Not on file   Number of children: 0   Years of education: Not on file   Highest education level: Not on file  Occupational History   Occupation: Marketing executive  Tobacco Use   Smoking status: Never   Smokeless tobacco: Never  Vaping Use   Vaping Use: Never used  Substance and Sexual Activity   Alcohol use: Yes    Comment: occassionally   Drug use: No   Sexual activity: Not on file  Other Topics Concern   Not on file  Social History Narrative   Not on file   Social Determinants of Health   Financial Resource Strain: Not on file  Food Insecurity: Not on file  Transportation Needs: Not on file  Physical Activity: Not on file  Stress: Not on file  Social Connections: Not on file  Intimate Partner Violence: Not on file    Outpatient Medications Prior to Visit  Medication Sig Dispense Refill   Ascorbic Acid (VITAMIN C) 1000 MG tablet Take 1,000 mg by mouth daily.     azithromycin (ZITHROMAX Z-PAK) 250 MG tablet 2 tabs a day the first day, then 1 tab a day x 4 days 6 tablet  0   beclomethasone (QVAR REDIHALER) 40 MCG/ACT inhaler Inhale 2 puffs into the lungs 2 (two) times daily. 1 each 2   benzonatate (TESSALON) 100 MG capsule Take 2 capsules (200 mg total) by mouth 3 (three) times daily as needed for cough. 30 capsule 0   budesonide-formoterol (SYMBICORT) 160-4.5 MCG/ACT inhaler Inhale 2 puffs into the lungs 2 (two) times daily. 10.2 g 5   cholecalciferol (VITAMIN D) 1000 UNITS tablet Take 1,000 Units by mouth daily.     fexofenadine (ALLEGRA ALLERGY) 180 MG tablet Take 1 tablet (180 mg total) by mouth daily.     FLUoxetine (PROZAC) 20 MG tablet TAKE 1 TABLET BY MOUTH EVERY DAY 90 tablet 1   levocetirizine (XYZAL) 5 MG tablet Take 1 tablet (5 mg total) by mouth every evening. 30 tablet 5   MIMVEY 1-0.5 MG tablet Take 1 tablet by mouth daily.     ondansetron (ZOFRAN ODT) 4 MG disintegrating  tablet Take 1 tablet (4 mg total) by mouth every 8 (eight) hours as needed for nausea or vomiting. 20 tablet 0   zinc gluconate 50 MG tablet Take 50 mg by mouth daily.     albuterol (VENTOLIN HFA) 108 (90 Base) MCG/ACT inhaler TAKE 2 PUFFS BY MOUTH EVERY 6 HOURS AS NEEDED FOR WHEEZE OR SHORTNESS OF BREATH 8.5 each 2   buPROPion (WELLBUTRIN XL) 150 MG 24 hr tablet Take 3 tablets (450 mg total) by mouth daily. Pt needs office visit for more refills 90 tablet 0   famotidine (PEPCID) 40 MG tablet TAKE 1 TABLET (40 MG TOTAL) BY MOUTH DAILY. 90 tablet 0   fluticasone (FLONASE) 50 MCG/ACT nasal spray Place 2 sprays into both nostrils daily. 48 g 3   montelukast (SINGULAIR) 10 MG tablet Take 1 tablet (10 mg total) by mouth at bedtime. 90 tablet 1   pantoprazole (PROTONIX) 40 MG tablet TAKE 1 TABLET (40 MG TOTAL) BY MOUTH DAILY. 90 tablet 0   No facility-administered medications prior to visit.    Allergies  Allergen Reactions   Vicodin [Hydrocodone-Acetaminophen] Nausea And Vomiting    Review of Systems  Constitutional:  Negative for fever and malaise/fatigue.  HENT:  Negative for congestion.   Eyes:  Negative for blurred vision.  Respiratory:  Positive for cough. Negative for shortness of breath.   Cardiovascular:  Negative for chest pain, palpitations and leg swelling.  Gastrointestinal:  Negative for abdominal pain, blood in stool and nausea.  Genitourinary:  Negative for dysuria and frequency.  Musculoskeletal:  Negative for falls.  Skin:  Negative for rash.  Neurological:  Negative for dizziness, loss of consciousness and headaches.  Endo/Heme/Allergies:  Negative for environmental allergies.  Psychiatric/Behavioral:  Negative for depression. The patient is not nervous/anxious.       Objective:    Physical Exam Vitals and nursing note reviewed.  Constitutional:      General: She is not in acute distress.    Appearance: Normal appearance. She is not ill-appearing.  HENT:     Head:  Normocephalic and atraumatic.     Right Ear: External ear normal.     Left Ear: External ear normal.  Eyes:     Extraocular Movements: Extraocular movements intact.     Pupils: Pupils are equal, round, and reactive to light.  Cardiovascular:     Rate and Rhythm: Normal rate and regular rhythm.     Heart sounds: Normal heart sounds. No murmur heard.   No gallop.  Pulmonary:     Effort: Pulmonary  effort is normal. No respiratory distress.     Breath sounds: Normal breath sounds. No wheezing or rales.  Skin:    General: Skin is warm and dry.  Neurological:     Mental Status: She is alert and oriented to person, place, and time.  Psychiatric:        Behavior: Behavior normal.        Judgment: Judgment normal.    BP 110/82 (BP Location: Left Arm, Patient Position: Sitting, Cuff Size: Normal)   Pulse (!) 105   Temp 99 F (37.2 C) (Oral)   Resp 18   Ht 5\' 4"  (1.626 m)   Wt 192 lb (87.1 kg)   SpO2 98%   BMI 32.96 kg/m  Wt Readings from Last 3 Encounters:  12/15/20 192 lb (87.1 kg)  12/12/20 196 lb (88.9 kg)  03/09/20 194 lb (88 kg)    Diabetic Foot Exam - Simple   No data filed    Lab Results  Component Value Date   WBC 9.2 03/09/2020   HGB 13.8 03/09/2020   HCT 41.6 03/09/2020   PLT 279.0 03/09/2020   GLUCOSE 92 03/09/2020   CHOL 150 12/11/2019   TRIG 191 (H) 12/11/2019   HDL 59 12/11/2019   LDLCALC 65 12/11/2019   ALT 11 03/09/2020   AST 11 03/09/2020   NA 139 03/09/2020   K 5.1 03/09/2020   CL 104 03/09/2020   CREATININE 0.64 03/09/2020   BUN 7 03/09/2020   CO2 29 03/09/2020   TSH 1.32 12/11/2019   HGBA1C 5.2 10/12/2014    Lab Results  Component Value Date   TSH 1.32 12/11/2019   Lab Results  Component Value Date   WBC 9.2 03/09/2020   HGB 13.8 03/09/2020   HCT 41.6 03/09/2020   MCV 94.7 03/09/2020   PLT 279.0 03/09/2020   Lab Results  Component Value Date   NA 139 03/09/2020   K 5.1 03/09/2020   CO2 29 03/09/2020   GLUCOSE 92 03/09/2020    BUN 7 03/09/2020   CREATININE 0.64 03/09/2020   BILITOT 0.4 03/09/2020   ALKPHOS 49 03/09/2020   AST 11 03/09/2020   ALT 11 03/09/2020   PROT 6.4 03/09/2020   ALBUMIN 4.2 03/09/2020   CALCIUM 9.6 03/09/2020   ANIONGAP 10 10/11/2014   GFR 99.57 03/09/2020   Lab Results  Component Value Date   CHOL 150 12/11/2019   Lab Results  Component Value Date   HDL 59 12/11/2019   Lab Results  Component Value Date   LDLCALC 65 12/11/2019   Lab Results  Component Value Date   TRIG 191 (H) 12/11/2019   Lab Results  Component Value Date   CHOLHDL 2.5 12/11/2019   Lab Results  Component Value Date   HGBA1C 5.2 10/12/2014       Assessment & Plan:   Problem List Items Addressed This Visit       Unprioritized   Depression   Relevant Medications   buPROPion (WELLBUTRIN XL) 150 MG 24 hr tablet   Anxiety and depression    con't wellbutrin       Relevant Medications   buPROPion (WELLBUTRIN XL) 150 MG 24 hr tablet   GERD (gastroesophageal reflux disease)    Stable  Refill protonix and pepcid      Relevant Medications   famotidine (PEPCID) 40 MG tablet   pantoprazole (PROTONIX) 40 MG tablet   Seasonal allergies   Relevant Medications   montelukast (SINGULAIR) 10 MG tablet  fluticasone (FLONASE) 50 MCG/ACT nasal spray   Viral URI with cough    Improving con't phenergan dm       Other Visit Diagnoses     Acute cough    -  Primary   Relevant Medications   promethazine-dextromethorphan (PROMETHAZINE-DM) 6.25-15 MG/5ML syrup   Gastroesophageal reflux disease       Relevant Medications   famotidine (PEPCID) 40 MG tablet   pantoprazole (PROTONIX) 40 MG tablet   Post-COVID chronic dyspnea       Relevant Medications   albuterol (VENTOLIN HFA) 108 (90 Base) MCG/ACT inhaler        Meds ordered this encounter  Medications   buPROPion (WELLBUTRIN XL) 150 MG 24 hr tablet    Sig: Take 3 tablets (450 mg total) by mouth daily. Pt needs office visit for more refills     Dispense:  90 tablet    Refill:  0   DISCONTD: famotidine (PEPCID) 40 MG tablet    Sig: Take 1 tablet (40 mg total) by mouth daily.    Dispense:  90 tablet    Refill:  0   montelukast (SINGULAIR) 10 MG tablet    Sig: Take 1 tablet (10 mg total) by mouth at bedtime.    Dispense:  90 tablet    Refill:  1   DISCONTD: pantoprazole (PROTONIX) 40 MG tablet    Sig: Take 1 tablet (40 mg total) by mouth daily.    Dispense:  90 tablet    Refill:  0   DISCONTD: fluticasone (FLONASE) 50 MCG/ACT nasal spray    Sig: Place 2 sprays into both nostrils daily.    Dispense:  48 g    Refill:  3   albuterol (VENTOLIN HFA) 108 (90 Base) MCG/ACT inhaler    Sig: TAKE 2 PUFFS BY MOUTH EVERY 6 HOURS AS NEEDED FOR WHEEZE OR SHORTNESS OF BREATH    Dispense:  8.5 each    Refill:  2   promethazine-dextromethorphan (PROMETHAZINE-DM) 6.25-15 MG/5ML syrup    Sig: Take 5 mLs by mouth 4 (four) times daily as needed.    Dispense:  118 mL    Refill:  0   famotidine (PEPCID) 40 MG tablet    Sig: Take 1 tablet (40 mg total) by mouth daily.    Dispense:  90 tablet    Refill:  3   fluticasone (FLONASE) 50 MCG/ACT nasal spray    Sig: Place 2 sprays into both nostrils daily.    Dispense:  48 g    Refill:  3   pantoprazole (PROTONIX) 40 MG tablet    Sig: Take 1 tablet (40 mg total) by mouth daily.    Dispense:  90 tablet    Refill:  3    I, Dr. Roma Schanz, DO, personally preformed the services described in this documentation.  All medical record entries made by the scribe were at my direction and in my presence.  I have reviewed the chart and discharge instructions (if applicable) and agree that the record reflects my personal performance and is accurate and complete. 12/15/2020   I,[name],acting as a scribe for Ann Held, DO.,have documented all relevant documentation on the behalf of Ann Held, DO,as directed by  Ann Held, DO while in the presence of Ann Held, DO.   Ann Held, DO

## 2020-12-15 NOTE — Assessment & Plan Note (Signed)
Improving con't phenergan dm

## 2020-12-21 ENCOUNTER — Encounter: Payer: Self-pay | Admitting: Family Medicine

## 2020-12-22 ENCOUNTER — Encounter: Payer: Self-pay | Admitting: Internal Medicine

## 2020-12-22 ENCOUNTER — Other Ambulatory Visit: Payer: Self-pay | Admitting: Internal Medicine

## 2020-12-22 MED ORDER — PREDNISONE 10 MG PO TABS: ORAL_TABLET | ORAL | 0 refills | Status: DC

## 2020-12-22 NOTE — Progress Notes (Signed)
Subjective:    Patient ID: Vicki Le, female    DOB: 05/05/1965, 55 y.o.   MRN: [id number]  DOS:  12/22/2020 Type of visit - description:     Review of Systems See above   Past Medical History:  Diagnosis Date   Anxiety    Chicken pox    Depression    GERD (gastroesophageal reflux disease)    Melanoma ([place])    left buttock    Past Surgical History:  Procedure Laterality Date   MELANOMA EXCISION  2013   skin cancer removal  August 14, 2011   2 lymph nodes removed as well   TONSILECTOMY/ADENOIDECTOMY WITH MYRINGOTOMY     WRIST SURGERY  1991/1192   ganglion cyst r wrist    Allergies as of 12/22/2020       Reactions   Vicodin [hydrocodone-acetaminophen] Nausea And Vomiting        Medication List        Accurate as of December 22, 2020  1:57 PM. If you have any questions, ask your nurse or doctor.          STOP taking these medications    azithromycin 250 MG tablet Commonly known as: Zithromax Z-Pak       TAKE these medications    albuterol 108 (90 Base) MCG/ACT inhaler Commonly known as: VENTOLIN HFA TAKE 2 PUFFS BY MOUTH EVERY 6 HOURS AS NEEDED FOR WHEEZE OR SHORTNESS OF BREATH   benzonatate 100 MG capsule Commonly known as: TESSALON Take 2 capsules (200 mg total) by mouth 3 (three) times daily as needed for cough.   budesonide-formoterol 160-4.5 MCG/ACT inhaler Commonly known as: Symbicort Inhale 2 puffs into the lungs 2 (two) times daily.   buPROPion 150 MG 24 hr tablet Commonly known as: WELLBUTRIN XL Take 3 tablets (450 mg total) by mouth daily. Pt needs office visit for more refills   cholecalciferol 1000 units tablet Commonly known as: VITAMIN D Take 1,000 Units by mouth daily.   famotidine 40 MG tablet Commonly known as: PEPCID Take 1 tablet (40 mg total) by mouth daily.   fexofenadine 180 MG tablet Commonly known as: Allegra Allergy Take 1 tablet (180 mg total) by mouth daily.   FLUoxetine 20 MG tablet Commonly known  as: PROZAC TAKE 1 TABLET BY MOUTH EVERY DAY   fluticasone 50 MCG/ACT nasal spray Commonly known as: FLONASE Place 2 sprays into both nostrils daily.   levocetirizine 5 MG tablet Commonly known as: Xyzal Take 1 tablet (5 mg total) by mouth every evening.   Mimvey 1-0.5 MG tablet Generic drug: estradiol-norethindrone Take 1 tablet by mouth daily.   montelukast 10 MG tablet Commonly known as: SINGULAIR Take 1 tablet (10 mg total) by mouth at bedtime.   ondansetron 4 MG disintegrating tablet Commonly known as: Zofran ODT Take 1 tablet (4 mg total) by mouth every 8 (eight) hours as needed for nausea or vomiting.   pantoprazole 40 MG tablet Commonly known as: PROTONIX Take 1 tablet (40 mg total) by mouth daily.   promethazine-dextromethorphan 6.25-15 MG/5ML syrup Commonly known as: PROMETHAZINE-DM Take 5 mLs by mouth 4 (four) times daily as needed.   Qvar RediHaler 40 MCG/ACT inhaler Generic drug: beclomethasone Inhale 2 puffs into the lungs 2 (two) times daily.   vitamin C 1000 MG tablet Take 1,000 mg by mouth daily.   zinc gluconate 50 MG tablet Take 50 mg by mouth daily.           Objective:  Physical Exam There were no vitals taken for this visit.     Assessment        This visit occurred during the SARS-CoV-2 public health emergency.  Safety protocols were in place, including screening questions prior to the visit, additional usage of staff PPE, and extensive cleaning of exam room while observing appropriate contact time as indicated for disinfecting solutions.

## 2020-12-23 MED ORDER — AMOXICILLIN-POT CLAVULANATE 875-125 MG PO TABS: 1.0000 | ORAL_TABLET | Freq: Two times a day (BID) | ORAL | 0 refills | Status: DC

## 2020-12-24 ENCOUNTER — Other Ambulatory Visit: Payer: Self-pay | Admitting: Family Medicine

## 2020-12-24 DIAGNOSIS — R051 Acute cough: Secondary | ICD-10-CM

## 2020-12-26 ENCOUNTER — Other Ambulatory Visit: Payer: Self-pay | Admitting: Family Medicine

## 2020-12-26 DIAGNOSIS — N760 Acute vaginitis: Secondary | ICD-10-CM

## 2020-12-26 MED ORDER — PROMETHAZINE-DM 6.25-15 MG/5ML PO SYRP: 5.0000 mL | ORAL_SOLUTION | Freq: Four times a day (QID) | ORAL | 0 refills | Status: DC | PRN

## 2020-12-26 MED ORDER — FLUCONAZOLE 150 MG PO TABS: ORAL_TABLET | ORAL | 0 refills | Status: DC

## 2021-01-05 ENCOUNTER — Other Ambulatory Visit: Payer: Self-pay | Admitting: Family Medicine

## 2021-01-05 DIAGNOSIS — F32A Depression, unspecified: Secondary | ICD-10-CM

## 2021-03-27 ENCOUNTER — Encounter: Payer: Self-pay | Admitting: Family Medicine

## 2021-03-27 ENCOUNTER — Telehealth (INDEPENDENT_AMBULATORY_CARE_PROVIDER_SITE_OTHER): Payer: Managed Care, Other (non HMO) | Admitting: Family Medicine

## 2021-03-27 VITALS — Temp 99.0°F

## 2021-03-27 DIAGNOSIS — U071 COVID-19: Secondary | ICD-10-CM | POA: Diagnosis not present

## 2021-03-27 MED ORDER — BENZONATATE 100 MG PO CAPS: 200.0000 mg | ORAL_CAPSULE | Freq: Three times a day (TID) | ORAL | 0 refills | Status: DC | PRN

## 2021-03-27 MED ORDER — ALBUTEROL SULFATE HFA 108 (90 BASE) MCG/ACT IN AERS: INHALATION_SPRAY | RESPIRATORY_TRACT | 2 refills | Status: DC

## 2021-03-27 MED ORDER — NIRMATRELVIR/RITONAVIR (PAXLOVID)TABLET: ORAL_TABLET | ORAL | 0 refills | Status: DC

## 2021-03-27 NOTE — Patient Instructions (Signed)
Paxlovid patient fact sheet: HotterNames.de  Over the counter medications that may be helpful for symptoms:  Guaifenesin 1200 mg extended release tabs twice daily, with plenty of water For cough and congestion Brand name: Mucinex   Pseudoephedrine 30 mg, one or two tabs every 4 to 6 hours For sinus congestion Brand name: Sudafed You must get this from the pharmacy counter.  Oxymetazoline nasal spray each morning, one spray in each nostril, for NO MORE THAN 3 days  For nasal and sinus congestion Brand name: Afrin Saline nasal spray or Saline Nasal Irrigation 3-5 times a day For nasal and sinus congestion Brand names: Ocean or AYR Fluticasone nasal spray, one spray in each nostril, each morning (after oxymetazoline and saline, if used) For nasal and sinus congestion Brand name: Flonase Warm salt water gargles  For sore throat Every few hours as needed Alternate ibuprofen 400-600 mg and acetaminophen 1000 mg every 4-6 hours For fever, body aches, headache Brand names: Motrin or Advil and Tylenol Dextromethorphan 12-hour cough version 30 mg every 12 hours  For cough Brand name: Delsym Stop all other cold medications for now (Nyquil, Dayquil, Tylenol Cold, Theraflu, etc) and other non-prescription cough/cold preparations. Many of these have the same ingredients listed above and could cause an overdose of medication.   Herbal treatments that have been shown to be helpful in some patients include: Vitamin C 1000mg  per day Vitamin D 4000iU per day Zinc 100mg  per day Quercetin 25-500mg  twice a day Melatonin 5-10mg  at bedtime  General Instructions Allow your body to rest Drink PLENTY of fluids Isolate yourself from everyone, even family, until test results have returned  If your COVID-19 test is positive Then you ARE INFECTED and you can pass the virus to others You must quarantine from others for a minimum of  5 days since symptoms started AND You are  fever free for 24 hours WITHOUT any medication to reduce fever AND Your symptoms are improving Wear mask for the next 5 days If not improved by day 5, quarantine for the full 10 days. Do not go to the store or other public areas Do not go around household members who are not known to be infected with COVID-19 If you MUST leave your area of quarantine (example: go to a bathroom you share with others in your home), you must Wear a mask Wash your hands thoroughly Wipe down any surfaces you touch Do not share food, drinks, towels, or other items with other persons Dispose of your own tissues, food containers, etc  Once you have recovered, please continue good preventive care measures, including:  wearing a mask when in public wash your hands frequently avoid touching your face/nose/eyes cover coughs/sneezes with the inside of your elbow stay out of crowds keep a 6 foot distance from others  If you develop severe shortness of breath, uncontrolled fevers, coughing up blood, confusion, chest pain, or signs of dehydration (such as significantly decreased urine amounts or dizziness with standing) please go to the ER.

## 2021-03-27 NOTE — Progress Notes (Signed)
Virtual Video Visit via MyChart Note  I connected with  [name] on 03/27/21 at  8:20 AM EST by the video enabled telemedicine application for MyChart, and verified that I am speaking with the correct person using two identifiers.   I introduced myself as a Designer, jewellery with the practice. We discussed the limitations of evaluation and management by telemedicine and the availability of in person appointments. The patient expressed understanding and agreed to proceed.  Participating parties in this visit include: The patient and the nurse practitioner listed.  The patient is: At home I am: In the office - [facility]  Subjective:    CC: COVID +   HPI: Vicki Le is a 56 y.o. year old female presenting today via [place] today for + COVID.  Three days ago patient started feeling flu-like with fatigue, body aches, fever. Symptoms gradually worsened and she had two positive home COVID tests. She continues to experience fatigue, nasal congestion, sinus pressure, occasional diarrhea, low grade fevers. Reports she had COVID in 2020 and struggled with ongoing URI/long-hauler symptoms. She denies any chest pain, dyspnea, loss of taste/smell.     Past medical history, Surgical history, Family history not pertinant except as noted below, Social history, Allergies, and medications have been entered into the medical record, reviewed, and corrections made.   Review of Systems:  All review of systems negative except what is listed in the HPI   Objective:    General:  Speaking clearly in complete sentences. Absent shortness of breath noted.   Alert and oriented x3.   Normal judgment.  Absent acute distress.   Impression and Recommendations:    1. COVID-19 - nirmatrelvir/ritonavir EUA (PAXLOVID) 20 x 150 MG & 10 x 100MG  TABS; 1 dose p.o. twice daily for 5 days, may use any available formulation at pharmacy with the same total dosage.  Dispense: 30  tablet; Refill: 0 - albuterol (VENTOLIN HFA) 108 (90 Base) MCG/ACT inhaler; TAKE 2 PUFFS BY MOUTH EVERY 6 HOURS AS NEEDED FOR WHEEZE OR SHORTNESS OF BREATH  Dispense: 8.5 each; Refill: 2 - benzonatate (TESSALON) 100 MG capsule; Take 2 capsules (200 mg total) by mouth 3 (three) times daily as needed for cough.  Dispense: 30 capsule; Refill: 0   Adding Paxlovid and refilling her inhaler and Tessalon. Education added to AVS. Continue supportive measures including rest, hydration, humidifier use, steam showers, warm compresses to sinuses, warm liquids with lemon and honey, and over-the-counter cough, cold, and analgesics as needed.  Patient aware of signs/symptoms requiring further/urgent evaluation.     Follow-up if symptoms worsen or fail to improve.    I discussed the assessment and treatment plan with the patient. The patient was provided an opportunity to ask questions and all were answered. The patient agreed with the plan and demonstrated an understanding of the instructions.   The patient was advised to call back or seek an in-person evaluation if the symptoms worsen or if the condition fails to improve as anticipated.  I spent 20 minutes dedicated to the care of this patient on the date of this encounter to include pre-visit chart review of prior notes and results, face-to-face time with the patient, and post-visit ordering of testing as indicated.   Terrilyn Saver, NP

## 2021-04-04 ENCOUNTER — Other Ambulatory Visit: Payer: Self-pay | Admitting: Family Medicine

## 2021-04-04 DIAGNOSIS — U071 COVID-19: Secondary | ICD-10-CM

## 2021-04-05 ENCOUNTER — Encounter: Payer: Self-pay | Admitting: Family Medicine

## 2021-04-05 DIAGNOSIS — J329 Chronic sinusitis, unspecified: Secondary | ICD-10-CM

## 2021-04-05 MED ORDER — PREDNISONE 20 MG PO TABS: 40.0000 mg | ORAL_TABLET | Freq: Every day | ORAL | 0 refills | Status: AC

## 2021-04-05 MED ORDER — AMOXICILLIN-POT CLAVULANATE 875-125 MG PO TABS: 1.0000 | ORAL_TABLET | Freq: Two times a day (BID) | ORAL | 0 refills | Status: AC

## 2021-04-05 MED ORDER — BENZONATATE 100 MG PO CAPS: 200.0000 mg | ORAL_CAPSULE | Freq: Three times a day (TID) | ORAL | 0 refills | Status: DC | PRN

## 2021-04-05 MED ORDER — FLUCONAZOLE 150 MG PO TABS: 150.0000 mg | ORAL_TABLET | Freq: Once | ORAL | 3 refills | Status: AC

## 2021-04-05 NOTE — Addendum Note (Signed)
Addended by: Caleen Jobs B on: 04/05/2021 01:44 PM ? ? Modules accepted: Orders ? ?

## 2021-04-15 ENCOUNTER — Telehealth: Payer: Self-pay | Admitting: Family Medicine

## 2021-04-15 DIAGNOSIS — F32A Depression, unspecified: Secondary | ICD-10-CM

## 2021-04-17 ENCOUNTER — Encounter: Payer: Self-pay | Admitting: Family Medicine

## 2021-04-17 NOTE — Telephone Encounter (Signed)
PA initiated via Covermymeds; KEY: [id number]. Awaiting determination.  ?

## 2021-04-21 ENCOUNTER — Encounter: Payer: Self-pay | Admitting: Family Medicine

## 2021-04-21 DIAGNOSIS — R051 Acute cough: Secondary | ICD-10-CM

## 2021-04-21 MED ORDER — PROMETHAZINE-DM 6.25-15 MG/5ML PO SYRP: 5.0000 mL | ORAL_SOLUTION | Freq: Four times a day (QID) | ORAL | 0 refills | Status: DC | PRN

## 2021-04-24 NOTE — Telephone Encounter (Signed)
PA approved.  ? ?CaseId:[id number];Status:Approved;Review Type:Qty;Coverage Start Date:03/18/2021;Coverage End Date:04/17/2022; ?

## 2021-04-25 ENCOUNTER — Encounter: Payer: Self-pay | Admitting: Family Medicine

## 2021-04-25 ENCOUNTER — Ambulatory Visit: Payer: Managed Care, Other (non HMO) | Admitting: Family Medicine

## 2021-04-25 VITALS — BP 110/86 | HR 98 | Temp 98.8°F | Resp 18 | Ht 64.0 in | Wt 196.0 lb

## 2021-04-25 DIAGNOSIS — J0140 Acute pansinusitis, unspecified: Secondary | ICD-10-CM | POA: Insufficient documentation

## 2021-04-25 DIAGNOSIS — F32A Depression, unspecified: Secondary | ICD-10-CM

## 2021-04-25 MED ORDER — CEFDINIR 300 MG PO CAPS: 300.0000 mg | ORAL_CAPSULE | Freq: Two times a day (BID) | ORAL | 0 refills | Status: DC

## 2021-04-25 MED ORDER — BUPROPION HCL ER (XL) 150 MG PO TB24: 450.0000 mg | ORAL_TABLET | Freq: Every day | ORAL | 0 refills | Status: DC

## 2021-04-25 MED ORDER — BUPROPION HCL ER (XL) 150 MG PO TB24: 450.0000 mg | ORAL_TABLET | Freq: Every day | ORAL | 3 refills | Status: DC

## 2021-04-25 NOTE — Assessment & Plan Note (Signed)
Post covid ?abx per orders ?con't flonase and xyzal  ?Phenergan dm  ?Call or rto if symtptoms do not improve ?

## 2021-04-25 NOTE — Patient Instructions (Signed)

## 2021-04-25 NOTE — Progress Notes (Signed)
? ?Subjective:  ? ?By signing my name below, I, Vicki Le, attest that this documentation has been prepared under the direction and in the presence of Dr. Roma Schanz, DO. 04/25/2021 ? ? ? Patient ID: Vicki Le, female    DOB: 02-09-1965, 56 y.o.   MRN: [id number] ? ?Chief Complaint  ?Patient presents with  ? COVID Follow up  ?  Pt states still having cough, fatigue, sinus pressure, and SOB. Pt states testing positive for Covid on 03/25/2021.   ? ? ?HPI ?Patient is in today for a office visit.  ? ?She reports contracting Covid-19 on 03/25/2021. She developed a fever for 2 days. She had congestion and reports being "bedridden" for 2 days. She reports taking paxlovid to manage her symptoms and found relief.  ?She continues having sinus pressure, occasional sore throat, post-nasal drip, cough, dry mouth, congestion, and fatigue since recovering from Covid-19. She is taking mucinex, Flonase, Singulair, xyxal, albuterol inhaler to manage her symptoms. She also reports having occasional SOB and/or wheezing and uses albuterol to manage her symptoms. She denies having any fever and chest tightness at this time. She has taken Augmentin, prednisone, tessalon perles to manage her symptoms prior and found mild relief. She has completed her Augmentin and prednisone course at this time.  ?She is also requesting a refill for 450 mg Wellbutrin daily PO.  ? ? ?Past Medical History:  ?Diagnosis Date  ? Anxiety   ? Chicken pox   ? Depression   ? GERD (gastroesophageal reflux disease)   ? Melanoma ([place])   ? left buttock  ? ? ?Past Surgical History:  ?Procedure Laterality Date  ? MELANOMA EXCISION  2013  ? skin cancer removal  August 14, 2011  ? 2 lymph nodes removed as well  ? TONSILECTOMY/ADENOIDECTOMY WITH MYRINGOTOMY    ? WRIST SURGERY  1991/1192  ? ganglion cyst r wrist  ? ? ?Family History  ?Problem Relation Age of Onset  ? Alzheimer's disease Father   ? Colon polyps Father   ?     suspicious polyps and 8 in of colon  removed ? colon cancer   ? Depression Father   ? Hypertension Father   ? Depression Mother   ? Kidney Stones Mother   ? Breast cancer Maternal Grandmother   ? Breast cancer Paternal Aunt   ? ? ?Social History  ? ?Socioeconomic History  ? Marital status: Married  ?  Spouse name: Not on file  ? Number of children: 0  ? Years of education: Not on file  ? Highest education level: Not on file  ?Occupational History  ? Occupation: Marketing executive  ?Tobacco Use  ? Smoking status: Never  ? Smokeless tobacco: Never  ?Vaping Use  ? Vaping Use: Never used  ?Substance and Sexual Activity  ? Alcohol use: Yes  ?  Comment: occassionally  ? Drug use: No  ? Sexual activity: Not on file  ?Other Topics Concern  ? Not on file  ?Social History Narrative  ? Not on file  ? ?Social Determinants of Health  ? ?Financial Resource Strain: Not on file  ?Food Insecurity: Not on file  ?Transportation Needs: Not on file  ?Physical Activity: Not on file  ?Stress: Not on file  ?Social Connections: Not on file  ?Intimate Partner Violence: Not on file  ? ? ?Outpatient Medications Prior to Visit  ?Medication Sig Dispense Refill  ? albuterol (VENTOLIN HFA) 108 (90 Base) MCG/ACT inhaler TAKE 2 PUFFS BY MOUTH EVERY 6  HOURS AS NEEDED FOR WHEEZE OR SHORTNESS OF BREATH 8.5 each 2  ? Ascorbic Acid (VITAMIN C) 1000 MG tablet Take 1,000 mg by mouth daily.    ? beclomethasone (QVAR REDIHALER) 40 MCG/ACT inhaler Inhale 2 puffs into the lungs 2 (two) times daily. 1 each 2  ? benzonatate (TESSALON) 100 MG capsule Take 2 capsules (200 mg total) by mouth 3 (three) times daily as needed for cough. 30 capsule 0  ? cholecalciferol (VITAMIN D) 1000 UNITS tablet Take 1,000 Units by mouth daily.    ? famotidine (PEPCID) 40 MG tablet Take 1 tablet (40 mg total) by mouth daily. 90 tablet 3  ? fexofenadine (ALLEGRA ALLERGY) 180 MG tablet Take 1 tablet (180 mg total) by mouth daily.    ? fluticasone (FLONASE) 50 MCG/ACT nasal spray Place 2 sprays into both nostrils daily. 48  g 3  ? levocetirizine (XYZAL) 5 MG tablet Take 1 tablet (5 mg total) by mouth every evening. 30 tablet 5  ? MIMVEY 1-0.5 MG tablet Take 1 tablet by mouth daily.    ? montelukast (SINGULAIR) 10 MG tablet Take 1 tablet (10 mg total) by mouth at bedtime. 90 tablet 1  ? ondansetron (ZOFRAN ODT) 4 MG disintegrating tablet Take 1 tablet (4 mg total) by mouth every 8 (eight) hours as needed for nausea or vomiting. 20 tablet 0  ? pantoprazole (PROTONIX) 40 MG tablet Take 1 tablet (40 mg total) by mouth daily. 90 tablet 3  ? promethazine-dextromethorphan (PROMETHAZINE-DM) 6.25-15 MG/5ML syrup Take 5 mLs by mouth 4 (four) times daily as needed. 118 mL 0  ? zinc gluconate 50 MG tablet Take 50 mg by mouth daily.    ? buPROPion (WELLBUTRIN XL) 150 MG 24 hr tablet Take 3 tablets (450 mg total) by mouth daily. 270 tablet 1  ? nirmatrelvir/ritonavir EUA (PAXLOVID) 20 x 150 MG & 10 x '100MG'$  TABS 1 dose p.o. twice daily for 5 days, may use any available formulation at pharmacy with the same total dosage. 30 tablet 0  ? ?No facility-administered medications prior to visit.  ? ? ?Allergies  ?Allergen Reactions  ? Vicodin [Hydrocodone-Acetaminophen] Nausea And Vomiting  ? ? ?Review of Systems  ?Constitutional:  Negative for fever.  ?HENT:  Positive for congestion and sore throat (occasional).   ?     (+)sinus pressure ?(+)post-nasal drip ?(+)dry mouth  ?Respiratory:  Positive for cough, shortness of breath (occasional) and wheezing (occasinoal).   ?     (+)chest tightness  ? ?   ?Objective:  ?  ?Physical Exam ?Constitutional:   ?   General: She is not in acute distress. ?   Appearance: Normal appearance. She is not ill-appearing.  ?HENT:  ?   Head: Normocephalic and atraumatic.  ?   Right Ear: External ear normal.  ?   Left Ear: External ear normal.  ?Eyes:  ?   Extraocular Movements: Extraocular movements intact.  ?   Pupils: Pupils are equal, round, and reactive to light.  ?Cardiovascular:  ?   Rate and Rhythm: Normal rate and regular  rhythm.  ?   Heart sounds: Normal heart sounds. No murmur heard. ?  No gallop.  ?Pulmonary:  ?   Effort: Pulmonary effort is normal. No respiratory distress.  ?   Breath sounds: Normal breath sounds. No wheezing or rales.  ?Skin: ?   General: Skin is warm and dry.  ?Neurological:  ?   Mental Status: She is alert and oriented to person, place, and time.  ?  Psychiatric:     ?   Judgment: Judgment normal.  ? ? ?BP 110/86 (BP Location: Right Arm, Patient Position: Sitting, Cuff Size: Large)   Pulse 98   Temp 98.8 ?F (37.1 ?C) (Oral)   Resp 18   Ht '5\' 4"'$  (1.626 m)   Wt 196 lb (88.9 kg)   SpO2 98%   BMI 33.64 kg/m?  ?Wt Readings from Last 3 Encounters:  ?04/25/21 196 lb (88.9 kg)  ?12/15/20 192 lb (87.1 kg)  ?12/12/20 196 lb (88.9 kg)  ? ? ?Diabetic Foot Exam - Simple   ?No data filed ?  ? ?Lab Results  ?Component Value Date  ? WBC 9.2 03/09/2020  ? HGB 13.8 03/09/2020  ? HCT 41.6 03/09/2020  ? PLT 279.0 03/09/2020  ? GLUCOSE 92 03/09/2020  ? CHOL 150 12/11/2019  ? TRIG 191 (H) 12/11/2019  ? HDL 59 12/11/2019  ? [place] 65 12/11/2019  ? ALT 11 03/09/2020  ? AST 11 03/09/2020  ? NA 139 03/09/2020  ? K 5.1 03/09/2020  ? CL 104 03/09/2020  ? CREATININE 0.64 03/09/2020  ? BUN 7 03/09/2020  ? CO2 29 03/09/2020  ? TSH 1.32 12/11/2019  ? HGBA1C 5.2 10/12/2014  ? ? ?Lab Results  ?Component Value Date  ? TSH 1.32 12/11/2019  ? ?Lab Results  ?Component Value Date  ? WBC 9.2 03/09/2020  ? HGB 13.8 03/09/2020  ? HCT 41.6 03/09/2020  ? MCV 94.7 03/09/2020  ? PLT 279.0 03/09/2020  ? ?Lab Results  ?Component Value Date  ? NA 139 03/09/2020  ? K 5.1 03/09/2020  ? CO2 29 03/09/2020  ? GLUCOSE 92 03/09/2020  ? BUN 7 03/09/2020  ? CREATININE 0.64 03/09/2020  ? BILITOT 0.4 03/09/2020  ? ALKPHOS 49 03/09/2020  ? AST 11 03/09/2020  ? ALT 11 03/09/2020  ? PROT 6.4 03/09/2020  ? ALBUMIN 4.2 03/09/2020  ? CALCIUM 9.6 03/09/2020  ? ANIONGAP 10 10/11/2014  ? GFR 99.57 03/09/2020  ? ?Lab Results  ?Component Value Date  ? CHOL 150 12/11/2019   ? ?Lab Results  ?Component Value Date  ? HDL 59 12/11/2019  ? ?Lab Results  ?Component Value Date  ? [place] 65 12/11/2019  ? ?Lab Results  ?Component Value Date  ? TRIG 191 (H) 12/11/2019  ? ?Lab Results  ?

## 2021-04-28 ENCOUNTER — Other Ambulatory Visit: Payer: Self-pay | Admitting: Family Medicine

## 2021-04-28 ENCOUNTER — Encounter: Payer: Self-pay | Admitting: Family Medicine

## 2021-04-28 MED ORDER — FLUCONAZOLE 150 MG PO TABS: ORAL_TABLET | ORAL | 0 refills | Status: DC

## 2021-05-02 ENCOUNTER — Other Ambulatory Visit: Payer: Self-pay | Admitting: Family Medicine

## 2021-05-02 DIAGNOSIS — R051 Acute cough: Secondary | ICD-10-CM

## 2021-05-02 DIAGNOSIS — U071 COVID-19: Secondary | ICD-10-CM

## 2021-05-03 MED ORDER — PROMETHAZINE-DM 6.25-15 MG/5ML PO SYRP: 5.0000 mL | ORAL_SOLUTION | Freq: Four times a day (QID) | ORAL | 0 refills | Status: DC | PRN

## 2021-05-03 MED ORDER — BENZONATATE 100 MG PO CAPS: 200.0000 mg | ORAL_CAPSULE | Freq: Three times a day (TID) | ORAL | 0 refills | Status: DC | PRN

## 2021-05-16 ENCOUNTER — Encounter: Payer: Self-pay | Admitting: Family Medicine

## 2021-05-16 ENCOUNTER — Other Ambulatory Visit: Payer: Self-pay | Admitting: Family Medicine

## 2021-05-16 DIAGNOSIS — R051 Acute cough: Secondary | ICD-10-CM

## 2021-05-16 MED ORDER — PROMETHAZINE-DM 6.25-15 MG/5ML PO SYRP: 5.0000 mL | ORAL_SOLUTION | Freq: Four times a day (QID) | ORAL | 0 refills | Status: DC | PRN

## 2021-05-16 NOTE — Telephone Encounter (Signed)
Pt seen for concern on 04/25/21. Would you like another office visit? ?

## 2021-05-18 ENCOUNTER — Ambulatory Visit: Payer: Managed Care, Other (non HMO) | Admitting: Family Medicine

## 2021-05-18 ENCOUNTER — Ambulatory Visit (HOSPITAL_BASED_OUTPATIENT_CLINIC_OR_DEPARTMENT_OTHER)
Admission: RE | Admit: 2021-05-18 | Discharge: 2021-05-18 | Disposition: A | Discharge status: Home / Self Care | Payer: Managed Care, Other (non HMO) | Source: Ambulatory Visit | Attending: Family Medicine | Admitting: Family Medicine

## 2021-05-18 ENCOUNTER — Encounter: Payer: Self-pay | Admitting: Family Medicine

## 2021-05-18 VITALS — BP 134/90 | HR 103 | Temp 99.8°F | Resp 18 | Ht 64.0 in | Wt 200.0 lb

## 2021-05-18 DIAGNOSIS — R053 Chronic cough: Secondary | ICD-10-CM

## 2021-05-18 DIAGNOSIS — R0683 Snoring: Secondary | ICD-10-CM

## 2021-05-18 MED ORDER — HYDROCODONE BIT-HOMATROP MBR 5-1.5 MG/5ML PO SOLN: 5.0000 mL | Freq: Three times a day (TID) | ORAL | 0 refills | Status: DC | PRN

## 2021-05-18 NOTE — Progress Notes (Signed)
? ?Subjective:  ? ?By signing my name below, I, [name], attest that this documentation has been prepared under the direction and in the presence of Ann Held, DO. 05/18/2021 ? ? ? Patient ID: Vicki Le, female    DOB: September 07, 1965, 56 y.o.   MRN: [id number] ? ?Chief Complaint  ?Patient presents with  ? COVID Follow up  ?  Pt states still having cough and voice hoariness.   ? ? ?HPI ?Patient is in today for a office visit.  ? ?She complains of persistent coughing. It is mostly a dry cough and occasionally a productive cough. She feels like something is stuck in her throat while coughing. She is taking promethazine-dextromethorphan to manage her cough but finds recently is is not helping her symptoms. She continues taking 100 mg tessalon and reports mild relief while taking it.  She feels occasional chest tightness and wheezing. Her cough is keeping her awake at night. She denies having any heartburn. She has an albuterol inhaler incase her breathing worsens. She does not have a Qvar inhaler due to the expensive price. She gets an upset stomach while taking hydrocodone. She has taken codeine in the past and had no adverse reaction while taking it.  ?She reports her husband thinks she might have sleep apnea. She snores all night. Her husband notes she has periods of not breathing while sleeping followed by gasping for air.  ? ? ?Past Medical History:  ?Diagnosis Date  ? Anxiety   ? Chicken pox   ? Depression   ? GERD (gastroesophageal reflux disease)   ? Melanoma ([place])   ? left buttock  ? ? ?Past Surgical History:  ?Procedure Laterality Date  ? MELANOMA EXCISION  2013  ? skin cancer removal  August 14, 2011  ? 2 lymph nodes removed as well  ? TONSILECTOMY/ADENOIDECTOMY WITH MYRINGOTOMY    ? WRIST SURGERY  1991/1192  ? ganglion cyst r wrist  ? ? ?Family History  ?Problem Relation Age of Onset  ? Alzheimer's disease Father   ? Colon polyps Father   ?     suspicious polyps and 8 in of colon removed ?  colon cancer   ? Depression Father   ? Hypertension Father   ? Depression Mother   ? Kidney Stones Mother   ? Breast cancer Maternal Grandmother   ? Breast cancer Paternal Aunt   ? ? ?Social History  ? ?Socioeconomic History  ? Marital status: Married  ?  Spouse name: Not on file  ? Number of children: 0  ? Years of education: Not on file  ? Highest education level: Not on file  ?Occupational History  ? Occupation: Marketing executive  ?Tobacco Use  ? Smoking status: Never  ? Smokeless tobacco: Never  ?Vaping Use  ? Vaping Use: Never used  ?Substance and Sexual Activity  ? Alcohol use: Yes  ?  Comment: occassionally  ? Drug use: No  ? Sexual activity: Not on file  ?Other Topics Concern  ? Not on file  ?Social History Narrative  ? Not on file  ? ?Social Determinants of Health  ? ?Financial Resource Strain: Not on file  ?Food Insecurity: Not on file  ?Transportation Needs: Not on file  ?Physical Activity: Not on file  ?Stress: Not on file  ?Social Connections: Not on file  ?Intimate Partner Violence: Not on file  ? ? ?Outpatient Medications Prior to Visit  ?Medication Sig Dispense Refill  ? albuterol (VENTOLIN HFA) 108 (90  Base) MCG/ACT inhaler TAKE 2 PUFFS BY MOUTH EVERY 6 HOURS AS NEEDED FOR WHEEZE OR SHORTNESS OF BREATH 8.5 each 2  ? Ascorbic Acid (VITAMIN C) 1000 MG tablet Take 1,000 mg by mouth daily.    ? beclomethasone (QVAR REDIHALER) 40 MCG/ACT inhaler Inhale 2 puffs into the lungs 2 (two) times daily. 1 each 2  ? benzonatate (TESSALON) 100 MG capsule Take 2 capsules (200 mg total) by mouth 3 (three) times daily as needed for cough. 30 capsule 0  ? buPROPion (WELLBUTRIN XL) 150 MG 24 hr tablet Take 3 tablets (450 mg total) by mouth daily. 90 tablet 0  ? cefdinir (OMNICEF) 300 MG capsule Take 1 capsule (300 mg total) by mouth 2 (two) times daily. 20 capsule 0  ? cholecalciferol (VITAMIN D) 1000 UNITS tablet Take 1,000 Units by mouth daily.    ? famotidine (PEPCID) 40 MG tablet Take 1 tablet (40 mg total) by mouth  daily. 90 tablet 3  ? fexofenadine (ALLEGRA ALLERGY) 180 MG tablet Take 1 tablet (180 mg total) by mouth daily.    ? fluconazole (DIFLUCAN) 150 MG tablet 1 po x1, may repeat in 3 days prn 2 tablet 0  ? fluticasone (FLONASE) 50 MCG/ACT nasal spray Place 2 sprays into both nostrils daily. 48 g 3  ? levocetirizine (XYZAL) 5 MG tablet Take 1 tablet (5 mg total) by mouth every evening. 30 tablet 5  ? MIMVEY 1-0.5 MG tablet Take 1 tablet by mouth daily.    ? montelukast (SINGULAIR) 10 MG tablet Take 1 tablet (10 mg total) by mouth at bedtime. 90 tablet 1  ? ondansetron (ZOFRAN ODT) 4 MG disintegrating tablet Take 1 tablet (4 mg total) by mouth every 8 (eight) hours as needed for nausea or vomiting. 20 tablet 0  ? pantoprazole (PROTONIX) 40 MG tablet Take 1 tablet (40 mg total) by mouth daily. 90 tablet 3  ? zinc gluconate 50 MG tablet Take 50 mg by mouth daily.    ? promethazine-dextromethorphan (PROMETHAZINE-DM) 6.25-15 MG/5ML syrup Take 5 mLs by mouth 4 (four) times daily as needed. 118 mL 0  ? ?No facility-administered medications prior to visit.  ? ? ?Allergies  ?Allergen Reactions  ? Vicodin [Hydrocodone-Acetaminophen] Nausea And Vomiting  ? ? ?Review of Systems  ?Respiratory:  Positive for cough (persistent) and wheezing (occasional).   ?     (+)occasional chest tightness ?(+)snoring while sleeping  ?Gastrointestinal:  Negative for heartburn.  ? ?   ?Objective:  ?  ?Physical Exam ?Constitutional:   ?   General: She is not in acute distress. ?   Appearance: Normal appearance. She is not ill-appearing.  ?HENT:  ?   Head: Normocephalic and atraumatic.  ?   Right Ear: External ear normal.  ?   Left Ear: External ear normal.  ?Eyes:  ?   Extraocular Movements: Extraocular movements intact.  ?   Pupils: Pupils are equal, round, and reactive to light.  ?Cardiovascular:  ?   Rate and Rhythm: Normal rate and regular rhythm.  ?   Heart sounds: Normal heart sounds. No murmur heard. ?  No gallop.  ?Pulmonary:  ?   Effort:  Pulmonary effort is normal. No respiratory distress.  ?   Breath sounds: Normal breath sounds. No wheezing or rales.  ?Skin: ?   General: Skin is warm and dry.  ?Neurological:  ?   Mental Status: She is alert and oriented to person, place, and time.  ?Psychiatric:     ?  Judgment: Judgment normal.  ? ? ?BP 134/90 (BP Location: Left Arm, Patient Position: Sitting, Cuff Size: Normal)   Pulse (!) 103   Temp 99.8 ?F (37.7 ?C) (Oral)   Resp 18   Ht '5\' 4"'$  (1.626 m)   Wt 200 lb (90.7 kg)   SpO2 97%   BMI 34.33 kg/m?  ?Wt Readings from Last 3 Encounters:  ?05/18/21 200 lb (90.7 kg)  ?04/25/21 196 lb (88.9 kg)  ?12/15/20 192 lb (87.1 kg)  ? ? ?Diabetic Foot Exam - Simple   ?No data filed ?  ? ?Lab Results  ?Component Value Date  ? WBC 9.2 03/09/2020  ? HGB 13.8 03/09/2020  ? HCT 41.6 03/09/2020  ? PLT 279.0 03/09/2020  ? GLUCOSE 92 03/09/2020  ? CHOL 150 12/11/2019  ? TRIG 191 (H) 12/11/2019  ? HDL 59 12/11/2019  ? [place] 65 12/11/2019  ? ALT 11 03/09/2020  ? AST 11 03/09/2020  ? NA 139 03/09/2020  ? K 5.1 03/09/2020  ? CL 104 03/09/2020  ? CREATININE 0.64 03/09/2020  ? BUN 7 03/09/2020  ? CO2 29 03/09/2020  ? TSH 1.32 12/11/2019  ? HGBA1C 5.2 10/12/2014  ? ? ?Lab Results  ?Component Value Date  ? TSH 1.32 12/11/2019  ? ?Lab Results  ?Component Value Date  ? WBC 9.2 03/09/2020  ? HGB 13.8 03/09/2020  ? HCT 41.6 03/09/2020  ? MCV 94.7 03/09/2020  ? PLT 279.0 03/09/2020  ? ?Lab Results  ?Component Value Date  ? NA 139 03/09/2020  ? K 5.1 03/09/2020  ? CO2 29 03/09/2020  ? GLUCOSE 92 03/09/2020  ? BUN 7 03/09/2020  ? CREATININE 0.64 03/09/2020  ? BILITOT 0.4 03/09/2020  ? ALKPHOS 49 03/09/2020  ? AST 11 03/09/2020  ? ALT 11 03/09/2020  ? PROT 6.4 03/09/2020  ? ALBUMIN 4.2 03/09/2020  ? CALCIUM 9.6 03/09/2020  ? ANIONGAP 10 10/11/2014  ? GFR 99.57 03/09/2020  ? ?Lab Results  ?Component Value Date  ? CHOL 150 12/11/2019  ? ?Lab Results  ?Component Value Date  ? HDL 59 12/11/2019  ? ?Lab Results  ?Component Value Date  ?  [place] 65 12/11/2019  ? ?Lab Results  ?Component Value Date  ? TRIG 191 (H) 12/11/2019  ? ?Lab Results  ?Component Value Date  ? CHOLHDL 2.5 12/11/2019  ? ?Lab Results  ?Component Value Date  ? HGBA1C 5.2 09/06/2

## 2021-05-18 NOTE — Patient Instructions (Signed)

## 2021-05-30 ENCOUNTER — Other Ambulatory Visit: Payer: Self-pay | Admitting: Family Medicine

## 2021-05-30 DIAGNOSIS — U071 COVID-19: Secondary | ICD-10-CM

## 2021-05-30 DIAGNOSIS — R053 Chronic cough: Secondary | ICD-10-CM

## 2021-05-31 ENCOUNTER — Encounter: Payer: Self-pay | Admitting: Family Medicine

## 2021-05-31 DIAGNOSIS — U071 COVID-19: Secondary | ICD-10-CM

## 2021-06-01 ENCOUNTER — Other Ambulatory Visit: Payer: Self-pay | Admitting: Family Medicine

## 2021-06-01 DIAGNOSIS — R053 Chronic cough: Secondary | ICD-10-CM

## 2021-06-01 MED ORDER — BENZONATATE 100 MG PO CAPS: 200.0000 mg | ORAL_CAPSULE | Freq: Three times a day (TID) | ORAL | 0 refills | Status: DC | PRN

## 2021-06-01 MED ORDER — HYDROCODONE BIT-HOMATROP MBR 5-1.5 MG/5ML PO SOLN: 5.0000 mL | Freq: Three times a day (TID) | ORAL | 0 refills | Status: DC | PRN

## 2021-06-09 ENCOUNTER — Encounter: Payer: Self-pay | Admitting: Pulmonary Disease

## 2021-06-09 ENCOUNTER — Ambulatory Visit (INDEPENDENT_AMBULATORY_CARE_PROVIDER_SITE_OTHER): Payer: Managed Care, Other (non HMO) | Admitting: Pulmonary Disease

## 2021-06-09 VITALS — BP 132/80 | HR 88 | Ht 64.0 in | Wt 200.4 lb

## 2021-06-09 DIAGNOSIS — R0683 Snoring: Secondary | ICD-10-CM | POA: Diagnosis not present

## 2021-06-09 MED ORDER — AZELASTINE HCL 0.15 % NA SOLN: 1.0000 | Freq: Two times a day (BID) | NASAL | Status: DC

## 2021-06-09 NOTE — Patient Instructions (Signed)
Try using astepro 1 spray in each nostril in the morning and the evening to help with runny nose, sinus congestion and post nasal drip ?Will arrange for home sleep study ?Will call to arrange for follow up after sleep study reviewed ? ?

## 2021-06-09 NOTE — Progress Notes (Signed)
? ?[facility] ? ?Chief Complaint  ?Patient presents with  ? Consult  ?  Referred by PCP for history of snoring and chronic cough after having covid. Semi-productive cough.   ? ? ?Past Surgical History:  ?She  has a past surgical history that includes skin cancer removal (August 14, 2011); Tonsilectomy/adenoidectomy with myringotomy; Wrist surgery (1991/1192); and Melanoma excision (2013). ? ?Past Medical History:  ?COVID 19 in 2020 and 2023, Anxiety, Depression, GERD, Melanoma, Allergies ? ?Constitutional:  ?BP 132/80   Pulse 88   Ht '5\' 4"'$  (1.626 m)   Wt 200 lb 6.4 oz (90.9 kg)   SpO2 99% Comment: on RA  BMI 34.40 kg/m?  ? ?Brief Summary:  ?Vicki Le is a 56 y.o. female with snoring. ?  ? ? ? ?Subjective:  ? ?Her husband has been concerned about there sleep and snoring.  She stops breathing at night and wakes up snoring.  She has trouble sleeping on her back.  She is restless at night.  She has trouble staying awake while watching movies.  Her mother has sleep apnea. ? ?She goes to sleep at 10 pm.  She falls asleep quickly.  She wakes up some times to use the bathroom.  She gets out of bed between 630 and 8 am.  She feels tired in the morning.  She denies morning headache.  She does not use anything to help her fall sleep or stay awake. ? ?She denies sleep walking, sleep talking, bruxism, or nightmares.  There is no history of restless legs.  She denies sleep hallucinations, sleep paralysis, or cataplexy. ? ?The Epworth score is 9 out of 24. ? ?She sees a dermatologist for eczema.  She also has sinus congestion with post nasal drip.  This causes a cough.  She has not seen an allergist before. ? ?Physical Exam:  ? ?Appearance - well kempt  ? ?ENMT - no sinus tenderness, no oral exudate, no LAN, Mallampati 3 airway, no stridor ? ?Respiratory - equal breath sounds bilaterally, no wheezing or rales ? ?CV - s1s2 regular rate and rhythm, no murmurs ? ?Ext - no clubbing, no  edema ? ?Skin - no rashes ? ?Psych - normal mood and affect ?  ?Sleep Tests:  ? ? ?Cardiac Tests:  ?Echo 10/12/14 >> EF 65 to 70%, grade 1 DD ? ?Social History:  ?She  reports that she has never smoked. She has never used smokeless tobacco. She reports current alcohol use. She reports that she does not use drugs. ? ?Family History:  ?Her family history includes Alzheimer's disease in her father; Breast cancer in her maternal grandmother and paternal aunt; Colon polyps in her father; Depression in her father and mother; Hypertension in her father; Kidney Stones in her mother. ?  ? ?Discussion:  ?She has snoring, sleep disruption, apnea and daytime sleepiness.  She has history of depression and family history of sleep apnea.  I am concerned she could have obstructive sleep apnea also. ? ?Assessment/Plan:  ? ?Snoring with excessive daytime sleepiness. ?- will need to arrange for a home sleep study ? ?Allergic rhinitis. ?- advised her to try adding astepro to her regimen ?- might need further allergy testing ? ?Obesity. ?- discussed how weight can impact sleep and risk for sleep disordered breathing ?- discussed options to assist with weight loss: combination of diet modification, cardiovascular and strength training exercises ? ?Cardiovascular risk. ?- had an extensive discussion regarding the adverse health consequences related  to untreated sleep disordered breathing ?- specifically discussed the risks for hypertension, coronary artery disease, cardiac dysrhythmias, cerebrovascular disease, and diabetes ?- lifestyle modification discussed ? ?Safe driving practices. ?- discussed how sleep disruption can increase risk of accidents, particularly when driving ?- safe driving practices were discussed ? ?Therapies for obstructive sleep apnea. ?- if the sleep study shows significant sleep apnea, then various therapies for treatment were reviewed: CPAP, oral appliance, and surgical interventions ? ?Time Spent Involved in  Patient Care on Day of Examination:  ?38 minutes ? ?Follow up:  ? ?Patient Instructions  ?Try using astepro 1 spray in each nostril in the morning and the evening to help with runny nose, sinus congestion and post nasal drip ?Will arrange for home sleep study ?Will call to arrange for follow up after sleep study reviewed ? ? ?Medication List:  ? ?Allergies as of 06/09/2021   ? ?   Reactions  ? Vicodin [hydrocodone-acetaminophen] Nausea And Vomiting  ? ?  ? ?  ?Medication List  ?  ? ?  ? Accurate as of Jun 09, 2021 12:33 PM. If you have any questions, ask your nurse or doctor.  ?  ?  ? ?  ? ?STOP taking these medications   ? ?cefdinir 300 MG capsule ?Commonly known as: OMNICEF ?Stopped by: Chesley Mires, MD ?  ? ?  ? ?TAKE these medications   ? ?albuterol 108 (90 Base) MCG/ACT inhaler ?Commonly known as: VENTOLIN HFA ?TAKE 2 PUFFS BY MOUTH EVERY 6 HOURS AS NEEDED FOR WHEEZE OR SHORTNESS OF BREATH ?  ?Azelastine HCl 0.15 % Soln ?Commonly known as: Astepro ?Place 1 spray into the nose 2 (two) times daily. ?Started by: Chesley Mires, MD ?  ?benzonatate 100 MG capsule ?Commonly known as: TESSALON ?Take 2 capsules (200 mg total) by mouth 3 (three) times daily as needed for cough. ?  ?buPROPion 150 MG 24 hr tablet ?Commonly known as: WELLBUTRIN XL ?Take 3 tablets (450 mg total) by mouth daily. ?  ?cholecalciferol 1000 units tablet ?Commonly known as: VITAMIN D ?Take 1,000 Units by mouth daily. ?  ?famotidine 40 MG tablet ?Commonly known as: PEPCID ?Take 1 tablet (40 mg total) by mouth daily. ?  ?fexofenadine 180 MG tablet ?Commonly known as: Allegra Allergy ?Take 1 tablet (180 mg total) by mouth daily. ?  ?fluconazole 150 MG tablet ?Commonly known as: DIFLUCAN ?1 po x1, may repeat in 3 days prn ?  ?fluticasone 50 MCG/ACT nasal spray ?Commonly known as: FLONASE ?Place 2 sprays into both nostrils daily. ?  ?HYDROcodone bit-homatropine 5-1.5 MG/5ML syrup ?Commonly known as: HYCODAN ?Take 5 mLs by mouth every 8 (eight) hours as  needed for cough. ?  ?levocetirizine 5 MG tablet ?Commonly known as: Xyzal ?Take 1 tablet (5 mg total) by mouth every evening. ?  ?Mimvey 1-0.5 MG tablet ?Generic drug: estradiol-norethindrone ?Take 1 tablet by mouth daily. ?  ?montelukast 10 MG tablet ?Commonly known as: SINGULAIR ?Take 1 tablet (10 mg total) by mouth at bedtime. ?  ?ondansetron 4 MG disintegrating tablet ?Commonly known as: Zofran ODT ?Take 1 tablet (4 mg total) by mouth every 8 (eight) hours as needed for nausea or vomiting. ?  ?pantoprazole 40 MG tablet ?Commonly known as: PROTONIX ?Take 1 tablet (40 mg total) by mouth daily. ?  ?Qvar RediHaler 40 MCG/ACT inhaler ?Generic drug: beclomethasone ?Inhale 2 puffs into the lungs 2 (two) times daily. ?  ?vitamin C 1000 MG tablet ?Take 1,000 mg by mouth daily. ?  ?zinc gluconate  50 MG tablet ?Take 50 mg by mouth daily. ?  ? ?  ? ? ?Signature:  ?Chesley Mires, MD ?[facility] ?Pager - [phone] - [phone] ?06/09/2021, 12:33 PM ?  ? ? ? ? ? ? ? ? ?

## 2021-06-16 ENCOUNTER — Telehealth: Payer: Self-pay

## 2021-06-16 ENCOUNTER — Encounter: Payer: Self-pay | Admitting: Family Medicine

## 2021-06-16 ENCOUNTER — Ambulatory Visit (INDEPENDENT_AMBULATORY_CARE_PROVIDER_SITE_OTHER): Payer: Managed Care, Other (non HMO) | Admitting: Family Medicine

## 2021-06-16 VITALS — BP 120/70 | HR 97 | Temp 98.9°F | Resp 18 | Ht 64.0 in | Wt 202.0 lb

## 2021-06-16 DIAGNOSIS — E669 Obesity, unspecified: Secondary | ICD-10-CM

## 2021-06-16 DIAGNOSIS — J302 Other seasonal allergic rhinitis: Secondary | ICD-10-CM | POA: Diagnosis not present

## 2021-06-16 DIAGNOSIS — Z0000 Encounter for general adult medical examination without abnormal findings: Secondary | ICD-10-CM | POA: Diagnosis not present

## 2021-06-16 DIAGNOSIS — Z1159 Encounter for screening for other viral diseases: Secondary | ICD-10-CM | POA: Diagnosis not present

## 2021-06-16 LAB — LIPID PANEL
Cholesterol: 172 mg/dL (ref 0–200)
HDL: 68.7 mg/dL (ref >39.00)
LDL Cholesterol: 71 mg/dL (ref 0–99)
NonHDL: 102.83
Total CHOL/HDL Ratio: 2
Triglycerides: 160 mg/dL — ABNORMAL HIGH (ref 0.0–149.0)
VLDL: 32 mg/dL (ref 0.0–40.0)

## 2021-06-16 LAB — COMPREHENSIVE METABOLIC PANEL
ALT: 15 U/L (ref 0–35)
AST: 13 U/L (ref 0–37)
Albumin: 4.3 g/dL (ref 3.5–5.2)
Alkaline Phosphatase: 44 U/L (ref 39–117)
BUN: 10 mg/dL (ref 6–23)
CO2: 29 mEq/L (ref 19–32)
Calcium: 9.3 mg/dL (ref 8.4–10.5)
Chloride: 103 mEq/L (ref 96–112)
Creatinine, Ser: 0.7 mg/dL (ref 0.40–1.20)
GFR: 96.58 mL/min (ref >60.00)
Glucose, Bld: 88 mg/dL (ref 70–99)
Potassium: 4.3 mEq/L (ref 3.5–5.1)
Sodium: 141 mEq/L (ref 135–145)
Total Bilirubin: 0.5 mg/dL (ref 0.2–1.2)
Total Protein: 6.5 g/dL (ref 6.0–8.3)

## 2021-06-16 LAB — CBC WITH DIFFERENTIAL/PLATELET
Basophils Absolute: 0 10*3/uL (ref 0.0–0.1)
Basophils Relative: 0.6 % (ref 0.0–3.0)
Eosinophils Absolute: 0.2 10*3/uL (ref 0.0–0.7)
Eosinophils Relative: 2.8 % (ref 0.0–5.0)
HCT: 41.5 % (ref 36.0–46.0)
Hemoglobin: 13.9 g/dL (ref 12.0–15.0)
Lymphocytes Relative: 24.3 % (ref 12.0–46.0)
Lymphs Abs: 1.8 10*3/uL (ref 0.7–4.0)
MCHC: 33.4 g/dL (ref 30.0–36.0)
MCV: 96.5 fl (ref 78.0–100.0)
Monocytes Absolute: 0.5 10*3/uL (ref 0.1–1.0)
Monocytes Relative: 7.4 % (ref 3.0–12.0)
Neutro Abs: 4.8 10*3/uL (ref 1.4–7.7)
Neutrophils Relative %: 64.9 % (ref 43.0–77.0)
Platelets: 305 10*3/uL (ref 150.0–400.0)
RBC: 4.3 Mil/uL (ref 3.87–5.11)
RDW: 13.3 % (ref 11.5–15.5)
WBC: 7.4 10*3/uL (ref 4.0–10.5)

## 2021-06-16 LAB — TSH: TSH: 2.03 u[IU]/mL (ref 0.35–5.50)

## 2021-06-16 MED ORDER — AZELASTINE HCL 0.05 % OP SOLN: 1.0000 [drp] | Freq: Two times a day (BID) | OPHTHALMIC | 12 refills | Status: DC

## 2021-06-16 MED ORDER — WEGOVY 0.5 MG/0.5ML ~~LOC~~ SOAJ: 0.5000 mg | SUBCUTANEOUS | 0 refills | Status: DC

## 2021-06-16 NOTE — Assessment & Plan Note (Signed)
wegovy started ?F/u 1 month ?Discussed the importance of diet  ?

## 2021-06-16 NOTE — Telephone Encounter (Signed)
PA approved. ? ?CaseId:[id number];Status:Approved;Review Type:Prior Auth;Coverage Start Date:05/17/2021;Coverage End Date:01/12/2022; ?

## 2021-06-16 NOTE — Progress Notes (Signed)
? ?Subjective:  ? ?By signing my name below, I, Vicki Le, attest that this documentation has been prepared under the direction and in the presence of Vicki Held, DO. 06/16/2021 ? ? ? Patient ID: Vicki Le, female    DOB: 04-28-1965, 56 y.o.   MRN: [id number] ? ?Chief Complaint  ?Patient presents with  ? Annual Exam  ?  Pt states fasting   ? ? ?HPI ?Patient is in today for a comprehensive physical exam. ? ?She reports her allergies this year have been severe. She uses several antihistamines and notes she wakes up with dry and itchy eyes. ? ?She still has a persistent cough that has resulted in a sore throat. She will be getting an allergy test. She gets coughing fits after dinner that often result in her throwing up all her food.  ? ?She went to pulmonary for suspicion of sleep apnea and will be conducting a sleep study. ? ?She reports she is allergic to the sun and develops welts when she sits outside in the sun. Sunscreen and allergy pills do not help to relieve the symptoms.  ? ?She is feeling depressed at this time. She has no motivation to lose weight at this time. She is interested in weight loss medications. ? ?She denies fever, hearing loss, ear pain,congestion, sinus pain, eye pain, chest pain, palpitations, shortness of breath, wheezing, nausea. vomiting, diarrhea, constipation, blood in stool, dysuria,frequency, hematuria and headaches.  ? ?She is not interested in the shingles vaccine due to history of anaphylaxis.  ? ?No changes in family medical history.  ? ?Past Medical History:  ?Diagnosis Date  ? Anxiety   ? Chicken pox   ? Depression   ? GERD (gastroesophageal reflux disease)   ? Melanoma ([place])   ? left buttock  ? ? ?Past Surgical History:  ?Procedure Laterality Date  ? MELANOMA EXCISION  2013  ? skin cancer removal  August 14, 2011  ? 2 lymph nodes removed as well  ? TONSILECTOMY/ADENOIDECTOMY WITH MYRINGOTOMY    ? WRIST SURGERY  1991/1192  ? ganglion cyst r wrist  ? ? ?Family  History  ?Problem Relation Age of Onset  ? Alzheimer's disease Father   ? Colon polyps Father   ?     suspicious polyps and 8 in of colon removed ? colon cancer   ? Depression Father   ? Hypertension Father   ? Depression Mother   ? Kidney Stones Mother   ? Breast cancer Maternal Grandmother   ? Breast cancer Paternal Aunt   ? ? ?Social History  ? ?Socioeconomic History  ? Marital status: Married  ?  Spouse name: Not on file  ? Number of children: 0  ? Years of education: Not on file  ? Highest education level: Not on file  ?Occupational History  ? Occupation: Marketing executive  ?Tobacco Use  ? Smoking status: Never  ? Smokeless tobacco: Never  ?Vaping Use  ? Vaping Use: Never used  ?Substance and Sexual Activity  ? Alcohol use: Yes  ?  Comment: occassionally  ? Drug use: No  ? Sexual activity: Yes  ?  Partners: Male  ?Other Topics Concern  ? Not on file  ?Social History Narrative  ? No exercise  ? ?Social Determinants of Health  ? ?Financial Resource Strain: Not on file  ?Food Insecurity: Not on file  ?Transportation Needs: Not on file  ?Physical Activity: Not on file  ?Stress: Not on file  ?Social Connections: Not  on file  ?Intimate Partner Violence: Not on file  ? ? ?Outpatient Medications Prior to Visit  ?Medication Sig Dispense Refill  ? albuterol (VENTOLIN HFA) 108 (90 Base) MCG/ACT inhaler TAKE 2 PUFFS BY MOUTH EVERY 6 HOURS AS NEEDED FOR WHEEZE OR SHORTNESS OF BREATH 8.5 each 2  ? Ascorbic Acid (VITAMIN C) 1000 MG tablet Take 1,000 mg by mouth daily.    ? Azelastine HCl (ASTEPRO) 0.15 % SOLN Place 1 spray into the nose 2 (two) times daily.    ? benzonatate (TESSALON) 100 MG capsule Take 2 capsules (200 mg total) by mouth 3 (three) times daily as needed for cough. 30 capsule 0  ? buPROPion (WELLBUTRIN XL) 150 MG 24 hr tablet Take 3 tablets (450 mg total) by mouth daily. 90 tablet 0  ? cholecalciferol (VITAMIN D) 1000 UNITS tablet Take 1,000 Units by mouth daily.    ? famotidine (PEPCID) 40 MG tablet Take 1  tablet (40 mg total) by mouth daily. 90 tablet 3  ? fexofenadine (ALLEGRA ALLERGY) 180 MG tablet Take 1 tablet (180 mg total) by mouth daily.    ? fluticasone (FLONASE) 50 MCG/ACT nasal spray Place 2 sprays into both nostrils daily. 48 g 3  ? HYDROcodone bit-homatropine (HYCODAN) 5-1.5 MG/5ML syrup Take 5 mLs by mouth every 8 (eight) hours as needed for cough. 120 mL 0  ? levocetirizine (XYZAL) 5 MG tablet Take 1 tablet (5 mg total) by mouth every evening. 30 tablet 5  ? MIMVEY 1-0.5 MG tablet Take 1 tablet by mouth daily.    ? montelukast (SINGULAIR) 10 MG tablet Take 1 tablet (10 mg total) by mouth at bedtime. 90 tablet 1  ? pantoprazole (PROTONIX) 40 MG tablet Take 1 tablet (40 mg total) by mouth daily. 90 tablet 3  ? zinc gluconate 50 MG tablet Take 50 mg by mouth daily.    ? beclomethasone (QVAR REDIHALER) 40 MCG/ACT inhaler Inhale 2 puffs into the lungs 2 (two) times daily. 1 each 2  ? fluconazole (DIFLUCAN) 150 MG tablet 1 po x1, may repeat in 3 days prn (Patient not taking: Reported on 06/16/2021) 2 tablet 0  ? ondansetron (ZOFRAN ODT) 4 MG disintegrating tablet Take 1 tablet (4 mg total) by mouth every 8 (eight) hours as needed for nausea or vomiting. 20 tablet 0  ? ?No facility-administered medications prior to visit.  ? ? ?Allergies  ?Allergen Reactions  ? Vicodin [Hydrocodone-Acetaminophen] Nausea And Vomiting  ? ? ?Review of Systems  ?Constitutional:  Negative for fever.  ?HENT:  Positive for sore throat. Negative for congestion, ear pain, hearing loss and sinus pain.   ?Eyes:  Negative for blurred vision and pain.  ?Respiratory:  Positive for cough. Negative for sputum production, shortness of breath and wheezing.   ?Cardiovascular:  Negative for chest pain and palpitations.  ?Gastrointestinal:  Negative for blood in stool, constipation, diarrhea, nausea and vomiting.  ?Genitourinary:  Negative for dysuria, frequency, hematuria and urgency.  ?Musculoskeletal:  Negative for back pain, falls and myalgias.   ?Neurological:  Negative for dizziness, sensory change, loss of consciousness, weakness and headaches.  ?Endo/Heme/Allergies:  Negative for environmental allergies. Does not bruise/bleed easily.  ?Psychiatric/Behavioral:  Positive for depression. Negative for suicidal ideas. The patient is not nervous/anxious and does not have insomnia.   ? ?   ?Objective:  ?  ?Physical Exam ?Constitutional:   ?   General: She is not in acute distress. ?   Appearance: Normal appearance. She is not ill-appearing.  ?HENT:  ?  Head: Normocephalic and atraumatic.  ?   Right Ear: External ear normal.  ?   Left Ear: External ear normal.  ?Eyes:  ?   Extraocular Movements: Extraocular movements intact.  ?   Pupils: Pupils are equal, round, and reactive to light.  ?Cardiovascular:  ?   Rate and Rhythm: Normal rate and regular rhythm.  ?   Pulses: Normal pulses.  ?   Heart sounds: Normal heart sounds. No murmur heard. ?  No gallop.  ?Pulmonary:  ?   Effort: Pulmonary effort is normal. No respiratory distress.  ?   Breath sounds: Normal breath sounds. No wheezing, rhonchi or rales.  ?Abdominal:  ?   General: Bowel sounds are normal. There is no distension.  ?   Palpations: Abdomen is soft. There is no mass.  ?   Tenderness: There is no abdominal tenderness. There is no guarding or rebound.  ?   Hernia: No hernia is present.  ?Musculoskeletal:  ?   Cervical back: Normal range of motion and neck supple.  ?Lymphadenopathy:  ?   Cervical: No cervical adenopathy.  ?Skin: ?   General: Skin is warm and dry.  ?Neurological:  ?   Mental Status: She is alert and oriented to person, place, and time.  ?Psychiatric:     ?   Behavior: Behavior normal.  ? ? ?BP 120/70 (BP Location: Right Arm, Patient Position: Sitting, Cuff Size: Large)   Pulse 97   Temp 98.9 ?F (37.2 ?C) (Oral)   Resp 18   Ht '5\' 4"'$  (1.626 m)   Wt 202 lb (91.6 kg)   SpO2 98%   BMI 34.67 kg/m?  ?Wt Readings from Last 3 Encounters:  ?06/16/21 202 lb (91.6 kg)  ?06/09/21 200 lb 6.4 oz  (90.9 kg)  ?05/18/21 200 lb (90.7 kg)  ? ? ?Diabetic Foot Exam - Simple   ?No data filed ?  ? ?Lab Results  ?Component Value Date  ? WBC 9.2 03/09/2020  ? HGB 13.8 03/09/2020  ? HCT 41.6 03/09/2020  ? PLT 279.0 02/

## 2021-06-16 NOTE — Assessment & Plan Note (Signed)
con't all meds ?Consider allergist after sleep study  ?

## 2021-06-16 NOTE — Patient Instructions (Signed)

## 2021-06-16 NOTE — Assessment & Plan Note (Signed)
ghm utd Check labs  See avs  

## 2021-06-16 NOTE — Telephone Encounter (Signed)
PA initiated via Covermymeds; KEY:  [id number]. Awaiting determination.  ?

## 2021-06-19 LAB — HEPATITIS C ANTIBODY
Hepatitis C Ab: NONREACTIVE
SIGNAL TO CUT-OFF: 0.06 (ref <1.00)

## 2021-06-23 ENCOUNTER — Other Ambulatory Visit: Payer: Self-pay | Admitting: Obstetrics and Gynecology

## 2021-06-23 DIAGNOSIS — R928 Other abnormal and inconclusive findings on diagnostic imaging of breast: Secondary | ICD-10-CM

## 2021-07-04 ENCOUNTER — Encounter: Payer: Self-pay | Admitting: Family Medicine

## 2021-07-04 ENCOUNTER — Ambulatory Visit
Admission: RE | Admit: 2021-07-04 | Discharge: 2021-07-04 | Disposition: A | Discharge status: Home / Self Care | Payer: Managed Care, Other (non HMO) | Source: Ambulatory Visit | Attending: Obstetrics and Gynecology | Admitting: Obstetrics and Gynecology

## 2021-07-04 DIAGNOSIS — R928 Other abnormal and inconclusive findings on diagnostic imaging of breast: Secondary | ICD-10-CM

## 2021-07-13 ENCOUNTER — Telehealth: Payer: Self-pay | Admitting: Family Medicine

## 2021-07-13 NOTE — Telephone Encounter (Signed)
I called the number back and referenced [id number] with Vicente Males, the representative at [facility].  There was a language barrier and noise in the background and I had to ask her to repeat a lot of things over during the conversation.  I relayed to her I was calling from the provider's office and returning a call from earlier in the day.  I asked her to please document in patient's account that our charge correction team had been emailed and asked to remove [phone] code for wrist extension control and to resubmit claim for patient.  She provided me with reference [id number] for my return call to them.

## 2021-07-13 NOTE — Telephone Encounter (Signed)
Christella Scheuermann is calling with the patient regarding the pt's OV on 3/21. She states that she is being charged for an OV and also a wrist extension control. Patient states she did have an OV but nothing having to do with her wrist. [employer] rep would like a call back to discuss the issue and fix the error. Rep can be reached at [phone] reference number is [id number]. Please advise.

## 2021-08-10 ENCOUNTER — Other Ambulatory Visit: Payer: Self-pay | Admitting: Family Medicine

## 2021-08-10 DIAGNOSIS — J302 Other seasonal allergic rhinitis: Secondary | ICD-10-CM

## 2021-08-22 ENCOUNTER — Ambulatory Visit: Payer: Managed Care, Other (non HMO)

## 2021-08-22 DIAGNOSIS — R0683 Snoring: Secondary | ICD-10-CM

## 2021-08-22 DIAGNOSIS — G4733 Obstructive sleep apnea (adult) (pediatric): Secondary | ICD-10-CM | POA: Diagnosis not present

## 2021-08-24 ENCOUNTER — Telehealth: Payer: Self-pay | Admitting: Pulmonary Disease

## 2021-08-24 DIAGNOSIS — G4733 Obstructive sleep apnea (adult) (pediatric): Secondary | ICD-10-CM | POA: Diagnosis not present

## 2021-08-24 NOTE — Telephone Encounter (Signed)
ATC patient.  LMTCB. 

## 2021-08-24 NOTE — Telephone Encounter (Signed)
HST 08/22/21 >> AHI 28.3, SpO2 low 71%   Please inform her that her sleep study shows moderate obstructive sleep apnea.  Please arrange for ROV with me or NP to discuss treatment options.

## 2021-08-24 NOTE — Telephone Encounter (Signed)
Pt returned call. Relayed the sleep study results to pt and have scheduled her a f/u to further discuss results and tx options. Nothing further needed.

## 2021-08-31 ENCOUNTER — Ambulatory Visit (INDEPENDENT_AMBULATORY_CARE_PROVIDER_SITE_OTHER): Payer: Managed Care, Other (non HMO) | Admitting: Nurse Practitioner

## 2021-08-31 ENCOUNTER — Encounter: Payer: Self-pay | Admitting: Nurse Practitioner

## 2021-08-31 VITALS — BP 122/82 | HR 92 | Temp 98.5°F | Ht 64.0 in | Wt 198.2 lb

## 2021-08-31 DIAGNOSIS — G4733 Obstructive sleep apnea (adult) (pediatric): Secondary | ICD-10-CM | POA: Insufficient documentation

## 2021-08-31 DIAGNOSIS — J3089 Other allergic rhinitis: Secondary | ICD-10-CM | POA: Diagnosis not present

## 2021-08-31 DIAGNOSIS — R0683 Snoring: Secondary | ICD-10-CM

## 2021-08-31 LAB — CBC WITH DIFFERENTIAL/PLATELET
Basophils Absolute: 0.1 10*3/uL (ref 0.0–0.1)
Basophils Relative: 0.7 % (ref 0.0–3.0)
Eosinophils Absolute: 0.3 10*3/uL (ref 0.0–0.7)
Eosinophils Relative: 3.6 % (ref 0.0–5.0)
HCT: 41.9 % (ref 36.0–46.0)
Hemoglobin: 13.9 g/dL (ref 12.0–15.0)
Lymphocytes Relative: 22.2 % (ref 12.0–46.0)
Lymphs Abs: 1.8 10*3/uL (ref 0.7–4.0)
MCHC: 33.3 g/dL (ref 30.0–36.0)
MCV: 95.2 fl (ref 78.0–100.0)
Monocytes Absolute: 0.6 10*3/uL (ref 0.1–1.0)
Monocytes Relative: 8.1 % (ref 3.0–12.0)
Neutro Abs: 5.2 10*3/uL (ref 1.4–7.7)
Neutrophils Relative %: 65.4 % (ref 43.0–77.0)
Platelets: 270 10*3/uL (ref 150.0–400.0)
RBC: 4.4 Mil/uL (ref 3.87–5.11)
RDW: 12.2 % (ref 11.5–15.5)
WBC: 8 10*3/uL (ref 4.0–10.5)

## 2021-08-31 NOTE — Progress Notes (Signed)
$'@Patient'M$  ID: Vicki Le, female    DOB: 07/23/1965, 56 y.o.   MRN: [id number]  Chief Complaint  Patient presents with   Follow-up    No new issues since LOV.    Referring provider: Ann Held, *  HPI: 56 year old female, never smoker followed for moderate OSA.  She is a patient of Dr. Juanetta Gosling and last seen in office 06/09/2021.  Past medical history significant for anxiety, depression, GERD, melanoma, allergies, obesity.  TEST/EVENTS:  08/22/2021: AHI 28.3; SPO2 low 71%, average 93%  06/09/2021: OV with Dr. Halford Chessman for sleep consult.  Has been concerned about sleep and snoring.  She also stops breathing at night and wakes up snoring.  Unable to sleep on back.  Feels tired in the morning.  Concern for OSA.  HST ordered.  Also reported some sinus congestion with postnasal drip which causes a cough.  She has not seen an allergist for this.  Recommended she try Astepro.  Might need further allergy testing.  08/31/2021: Today - follow up Patient presents today for follow-up after undergoing home sleep study which revealed moderate obstructive sleep apnea.  She continues to have trouble with snoring at night and witnessed apneas.  She also wakes up in the morning feeling poorly rested.  She is willing to start on CPAP therapy.  Denies any morning headaches or drowsy driving.  Since her last visit, she did start on Astepro for persistent allergic rhinitis.  Feels like it has helped a little bit but she continues to have trouble with generalized itching, watery eyes, sneezing and runny nose.  She struggled with allergies for a very long time.  Occasionally will develop hives as well.  She takes Flonase nasal spray, Allegra, Xyzal and Singulair at bedtime.  No issues with her breathing.  She does occasionally have a throat tickle and dry cough, which she correlates to her postnasal drainage.  Allergies  Allergen Reactions   Vicodin [Hydrocodone-Acetaminophen] Nausea And Vomiting     Immunization History  Administered Date(s) Administered   Tdap 10/03/2014    Past Medical History:  Diagnosis Date   Anxiety    Chicken pox    Depression    GERD (gastroesophageal reflux disease)    Melanoma (HCC)    left buttock    Tobacco History: Social History   Tobacco Use  Smoking Status Never   Passive exposure: Never  Smokeless Tobacco Never   Counseling given: Not Answered   Outpatient Medications Prior to Visit  Medication Sig Dispense Refill   albuterol (VENTOLIN HFA) 108 (90 Base) MCG/ACT inhaler TAKE 2 PUFFS BY MOUTH EVERY 6 HOURS AS NEEDED FOR WHEEZE OR SHORTNESS OF BREATH 8.5 each 2   Ascorbic Acid (VITAMIN C) 1000 MG tablet Take 1,000 mg by mouth daily.     azelastine (OPTIVAR) 0.05 % ophthalmic solution Place 1 drop into both eyes 2 (two) times daily. 6 mL 12   Azelastine HCl (ASTEPRO) 0.15 % SOLN Place 1 spray into the nose 2 (two) times daily.     benzonatate (TESSALON) 100 MG capsule Take 2 capsules (200 mg total) by mouth 3 (three) times daily as needed for cough. 30 capsule 0   buPROPion (WELLBUTRIN XL) 150 MG 24 hr tablet Take 3 tablets (450 mg total) by mouth daily. 90 tablet 0   cholecalciferol (VITAMIN D) 1000 UNITS tablet Take 1,000 Units by mouth daily.     famotidine (PEPCID) 40 MG tablet Take 1 tablet (40 mg total) by mouth daily.  90 tablet 3   fexofenadine (ALLEGRA ALLERGY) 180 MG tablet Take 1 tablet (180 mg total) by mouth daily.     fluticasone (FLONASE) 50 MCG/ACT nasal spray Place 2 sprays into both nostrils daily. 48 g 3   HYDROcodone bit-homatropine (HYCODAN) 5-1.5 MG/5ML syrup Take 5 mLs by mouth every 8 (eight) hours as needed for cough. 120 mL 0   levocetirizine (XYZAL) 5 MG tablet Take 1 tablet (5 mg total) by mouth every evening. 30 tablet 5   MIMVEY 1-0.5 MG tablet Take 1 tablet by mouth daily.     montelukast (SINGULAIR) 10 MG tablet TAKE 1 TABLET BY MOUTH EVERYDAY AT BEDTIME 90 tablet 1   pantoprazole (PROTONIX) 40 MG  tablet Take 1 tablet (40 mg total) by mouth daily. 90 tablet 3   Semaglutide-Weight Management (WEGOVY) 0.5 MG/0.5ML SOAJ Inject 0.5 mg into the skin once a week. 2 mL 0   zinc gluconate 50 MG tablet Take 50 mg by mouth daily.     No facility-administered medications prior to visit.     Review of Systems:   Constitutional: No weight loss or gain, night sweats, fevers, chills. + Fatigue HEENT: No headaches, difficulty swallowing, tooth/dental problems, or sore throat. +sneezing, itching, nasal congestion, post nasal drip CV:  No chest pain, orthopnea, PND, swelling in lower extremities, anasarca, dizziness, palpitations, syncope Resp: + Snoring, witnessed apneas, occasional dry cough.  No shortness of breath with exertion or at rest. No excess mucus or change in color of mucus. No hemoptysis. No wheezing.  No chest wall deformity GI:  No heartburn, indigestion, abdominal pain, nausea, vomiting, diarrhea, change in bowel habits, loss of appetite, bloody stools.  Skin: No rash, lesions, ulcerations MSK:  No joint pain or swelling.  No decreased range of motion.  No back pain. Neuro: No dizziness or lightheadedness.  Psych: No depression or anxiety. Mood stable.     Physical Exam:  BP 122/82 (BP Location: Right Arm, Patient Position: Sitting, Cuff Size: Normal)   Pulse 92   Temp 98.5 F (36.9 C) (Oral)   Ht '5\' 4"'$  (1.626 m)   Wt 198 lb 3.2 oz (89.9 kg)   SpO2 97%   BMI 34.02 kg/m   GEN: Pleasant, interactive, well-appearing; obese; in no acute distress. HEENT:  Normocephalic and atraumatic. PERRLA. Sclera white. Nasal turbinates erythematous, moist and patent bilaterally.  Clear rhinorrhea present. Oropharynx erythematous and moist, without exudate or edema. No lesions, ulcerations NECK:  Supple w/ fair ROM. No JVD present. Normal carotid impulses w/o bruits. Thyroid symmetrical with no goiter or nodules palpated. No lymphadenopathy.   CV: RRR, no m/r/g, no peripheral edema. Pulses  intact, +2 bilaterally. No cyanosis, pallor or clubbing. PULMONARY:  Unlabored, regular breathing. Clear bilaterally A&P w/o wheezes/rales/rhonchi. No accessory muscle use. No dullness to percussion. GI: BS present and normoactive. Soft, non-tender to palpation. No organomegaly or masses detected. No CVA tenderness. MSK: No erythema, warmth or tenderness. Cap refil <2 sec all extrem. No deformities or joint swelling noted.  Neuro: A/Ox3. No focal deficits noted.   Skin: Warm, no lesions or rashe Psych: Normal affect and behavior. Judgement and thought content appropriate.     Lab Results:  CBC    Component Value Date/Time   WBC 7.4 06/16/2021 0900   RBC 4.30 06/16/2021 0900   HGB 13.9 06/16/2021 0900   HCT 41.5 06/16/2021 0900   PLT 305.0 06/16/2021 0900   MCV 96.5 06/16/2021 0900   MCH 32.0 12/11/2019 0952   MCHC  33.4 06/16/2021 0900   RDW 13.3 06/16/2021 0900   LYMPHSABS 1.8 06/16/2021 0900   MONOABS 0.5 06/16/2021 0900   EOSABS 0.2 06/16/2021 0900   BASOSABS 0.0 06/16/2021 0900    BMET    Component Value Date/Time   NA 141 06/16/2021 0900   K 4.3 06/16/2021 0900   CL 103 06/16/2021 0900   CO2 29 06/16/2021 0900   GLUCOSE 88 06/16/2021 0900   BUN 10 06/16/2021 0900   CREATININE 0.70 06/16/2021 0900   CREATININE 0.62 12/11/2019 0952   CALCIUM 9.3 06/16/2021 0900   GFRNONAA >60 10/11/2014 1640   GFRAA >60 10/11/2014 1640    BNP No results found for: "BNP"   Imaging:  No results found.        No data to display          No results found for: "NITRICOXIDE"      Assessment & Plan:   Moderate obstructive sleep apnea Moderate OSA with AHI 28.3/h. Agreeable to CPAP therapy - we will start her on auto 5-20 cmH2O, mask of choice and heated humidification.   Patient Instructions  Start CPAP 5-20 cmH2O with nasal mask every night, minimum of 4-6 hours a night.  Change equipment every 30 days or as directed by DME. Wash your tubing with warm soap and  water daily, hang to dry. Wash humidifier portion weekly.  Be aware of reduced alertness and do not drive or operate heavy machinery if experiencing this or drowsiness.  Exercise encouraged, as tolerated. Avoid or decrease alcohol consumption and medications that make you more sleepy, if possible. Notify if persistent daytime sleepiness occurs even with consistent use of CPAP.  We discussed how untreated sleep apnea puts an individual at risk for cardiac arrhthymias, pulm HTN, DM, stroke and increases their risk for daytime accidents. We also briefly reviewed treatment options including weight loss, side sleeping position, oral appliance, CPAP therapy or referral to ENT for possible surgical options  Continue Albuterol inhaler 2 puffs every 6 hours as needed for shortness of breath or wheezing. Notify if symptoms persist despite rescue inhaler/neb use. Continue astepro 1 spray Twice daily for nasal congestion/drainage  Continue allegra 1 tab daily for allergies Continue singulair 1 tab At bedtime  Continue xyzal 1 tab daily   Follow up in 31-90 days after starting therapy with Dr. Halford Chessman or Alanson Aly. If symptoms do not improve or worsen, please contact office for sooner follow up or seek emergency care.     Environmental and seasonal allergies Poorly controlled despite multiple antihistamines and singulair. She is also using flonase and astepro nasal sprays. Check allergen panel and eosinophils today. We will refer her to allergist for further management and possible allergy shots.    I spent 35 minutes of dedicated to the care of this patient on the date of this encounter to include pre-visit review of records, face-to-face time with the patient discussing conditions above, post visit ordering of testing, clinical documentation with the electronic health record, making appropriate referrals as documented, and communicating necessary findings to members of the patients care  team.  Clayton Bibles, NP 08/31/2021  Pt aware and understands NP's role.

## 2021-08-31 NOTE — Assessment & Plan Note (Signed)
Poorly controlled despite multiple antihistamines and singulair. She is also using flonase and astepro nasal sprays. Check allergen panel and eosinophils today. We will refer her to allergist for further management and possible allergy shots.

## 2021-08-31 NOTE — Patient Instructions (Addendum)
Start CPAP 5-20 cmH2O with nasal mask every night, minimum of 4-6 hours a night.  Change equipment every 30 days or as directed by DME. Wash your tubing with warm soap and water daily, hang to dry. Wash humidifier portion weekly.  Be aware of reduced alertness and do not drive or operate heavy machinery if experiencing this or drowsiness.  Exercise encouraged, as tolerated. Avoid or decrease alcohol consumption and medications that make you more sleepy, if possible. Notify if persistent daytime sleepiness occurs even with consistent use of CPAP.  We discussed how untreated sleep apnea puts an individual at risk for cardiac arrhthymias, pulm HTN, DM, stroke and increases their risk for daytime accidents. We also briefly reviewed treatment options including weight loss, side sleeping position, oral appliance, CPAP therapy or referral to ENT for possible surgical options  Continue Albuterol inhaler 2 puffs every 6 hours as needed for shortness of breath or wheezing. Notify if symptoms persist despite rescue inhaler/neb use. Continue astepro 1 spray Twice daily for nasal congestion/drainage  Continue allegra 1 tab daily for allergies Continue singulair 1 tab At bedtime  Continue xyzal 1 tab daily   Follow up in 31-90 days after starting therapy with Dr. Halford Chessman or Alanson Aly. If symptoms do not improve or worsen, please contact office for sooner follow up or seek emergency care.

## 2021-08-31 NOTE — Assessment & Plan Note (Signed)
Moderate OSA with AHI 28.3/h. Agreeable to CPAP therapy - we will start her on auto 5-20 cmH2O, mask of choice and heated humidification.   Patient Instructions  Start CPAP 5-20 cmH2O with nasal mask every night, minimum of 4-6 hours a night.  Change equipment every 30 days or as directed by DME. Wash your tubing with warm soap and water daily, hang to dry. Wash humidifier portion weekly.  Be aware of reduced alertness and do not drive or operate heavy machinery if experiencing this or drowsiness.  Exercise encouraged, as tolerated. Avoid or decrease alcohol consumption and medications that make you more sleepy, if possible. Notify if persistent daytime sleepiness occurs even with consistent use of CPAP.  We discussed how untreated sleep apnea puts an individual at risk for cardiac arrhthymias, pulm HTN, DM, stroke and increases their risk for daytime accidents. We also briefly reviewed treatment options including weight loss, side sleeping position, oral appliance, CPAP therapy or referral to ENT for possible surgical options  Continue Albuterol inhaler 2 puffs every 6 hours as needed for shortness of breath or wheezing. Notify if symptoms persist despite rescue inhaler/neb use. Continue astepro 1 spray Twice daily for nasal congestion/drainage  Continue allegra 1 tab daily for allergies Continue singulair 1 tab At bedtime  Continue xyzal 1 tab daily   Follow up in 31-90 days after starting therapy with Dr. Halford Chessman or Alanson Aly. If symptoms do not improve or worsen, please contact office for sooner follow up or seek emergency care.

## 2021-09-03 LAB — ALLERGEN PANEL (27) + IGE
Alternaria Alternata IgE: <0.1 kU/L
Aspergillus Fumigatus IgE: <0.1 kU/L
Bahia Grass IgE: <0.1 kU/L
Bermuda Grass IgE: <0.1 kU/L
Cat Dander IgE: 0.67 kU/L — AB
Cedar, Mountain IgE: <0.1 kU/L
Cladosporium Herbarum IgE: <0.1 kU/L
Cocklebur IgE: <0.1 kU/L
Cockroach, American IgE: <0.1 kU/L
Common Silver Birch IgE: <0.1 kU/L
D Farinae IgE: <0.1 kU/L
D Pteronyssinus IgE: <0.1 kU/L
Dog Dander IgE: <0.1 kU/L
Elm, American IgE: <0.1 kU/L
Hickory, White IgE: <0.1 kU/L
IgE (Immunoglobulin E), Serum: 47 IU/mL (ref 6–495)
Johnson Grass IgE: <0.1 kU/L
Kentucky Bluegrass IgE: <0.1 kU/L
Maple/Box Elder IgE: <0.1 kU/L
Mucor Racemosus IgE: <0.1 kU/L
Oak, White IgE: <0.1 kU/L
Penicillium Chrysogen IgE: <0.1 kU/L
Pigweed, Rough IgE: <0.1 kU/L
Plantain, English IgE: <0.1 kU/L
Ragweed, Short IgE: <0.1 kU/L
Setomelanomma Rostrat: <0.1 kU/L
Timothy Grass IgE: <0.1 kU/L
White Mulberry IgE: <0.1 kU/L

## 2021-09-07 NOTE — Progress Notes (Signed)
Reviewed and agree with assessment/plan.   Chesley Mires, MD [facility] 09/07/2021, 9:50 AM Pager:  [phone]

## 2021-09-11 NOTE — Progress Notes (Signed)
Please notify patient that her allergen panel resulted with positive response to cat dander. I do not recall specifically if she has cats or not? Her eosinophils are also elevated at 300. She was previously referred to allergist for further management of her persistent allergy symptoms so would recommend she keep this appt. Thanks!

## 2021-09-18 ENCOUNTER — Encounter: Payer: Self-pay | Admitting: Family Medicine

## 2021-10-20 ENCOUNTER — Other Ambulatory Visit: Payer: Self-pay | Admitting: Family Medicine

## 2021-10-20 DIAGNOSIS — F32A Depression, unspecified: Secondary | ICD-10-CM

## 2021-10-26 ENCOUNTER — Encounter: Payer: Self-pay | Admitting: Gastroenterology

## 2021-11-01 ENCOUNTER — Encounter: Payer: Self-pay | Admitting: Pulmonary Disease

## 2021-11-01 ENCOUNTER — Ambulatory Visit: Payer: Managed Care, Other (non HMO) | Admitting: Pulmonary Disease

## 2021-11-01 VITALS — BP 110/80 | HR 100 | Wt 199.2 lb

## 2021-11-01 DIAGNOSIS — Z789 Other specified health status: Secondary | ICD-10-CM

## 2021-11-01 DIAGNOSIS — G4733 Obstructive sleep apnea (adult) (pediatric): Secondary | ICD-10-CM

## 2021-11-01 NOTE — Patient Instructions (Signed)
Will have [facility] refit your CPAP mask and change your CPAP pressure settings  Follow up in 4 months

## 2021-11-01 NOTE — Progress Notes (Signed)
[facility]  Chief Complaint  Patient presents with   Follow-up    Past Surgical History:  She  has a past surgical history that includes skin cancer removal (August 14, 2011); Tonsilectomy/adenoidectomy with myringotomy; Wrist surgery (1991/1192); and Melanoma excision (2013).  Past Medical History:  COVID 19 in 2020 and 2023, Anxiety, Depression, GERD, Melanoma, Allergies  Constitutional:  BP 110/80 (BP Location: Right Arm)   Pulse 100   Wt 199 lb 3.2 oz (90.4 kg)   SpO2 96%   BMI 34.19 kg/m   Brief Summary:  Vicki Le is a 56 y.o. female with obstructive sleep apnea.      Subjective:   Her sleep study from July showed moderate sleep apnea.  Saw Katy Cobb.  Started on auto CPAP.  She has been using nasal pillows mask.  She has trouble breathing through her nose.  She has mouth leak.  She uses flonase, astepro, Human resources officer, and xyzal.    Physical Exam:   Appearance - well kempt   ENMT - no sinus tenderness, no oral exudate, no LAN, Mallampati 3 airway, no stridor  Respiratory - equal breath sounds bilaterally, no wheezing or rales  CV - s1s2 regular rate and rhythm, no murmurs  Ext - no clubbing, no edema  Skin - no rashes  Psych - normal mood and affect    Sleep Tests:  HST 08/22/21 >> AHI 28.3, SpO2 low 71%  Cardiac Tests:  Echo 10/12/14 >> EF 65 to 70%, grade 1 DD  Social History:  She  reports that she has never smoked. She has never been exposed to tobacco smoke. She has never used smokeless tobacco. She reports current alcohol use. She reports that she does not use drugs.  Family History:  Her family history includes Alzheimer's disease in her father; Breast cancer in her maternal grandmother and paternal aunt; Colon polyps in her father; Depression in her father and mother; Hypertension in her father; Kidney Stones in her mother.     Assessment/Plan:   Obstructive sleep apnea. - she is compliant with  CPAP and reports benefit from therapy - uses [facility] for her DME - current CPAP ordered July 2023 - she is having trouble with mask fit due to sinus issues - will change to full face mask - will change auto CPAP to 5 -12 cm H2O  Allergic rhinitis. - flonase, astepro, allegra, xyzal - advised her to use nasal irrigation nightly - she will schedule appointment with allergist  Time Spent Involved in Patient Care on Day of Examination:  26 minutes  Follow up:   Patient Instructions  Will have [facility] refit your CPAP mask and change your CPAP pressure settings  Follow up in 4 months  Medication List:   Allergies as of 11/01/2021       Reactions   Vicodin [hydrocodone-acetaminophen] Nausea And Vomiting        Medication List        Accurate as of November 01, 2021  9:46 AM. If you have any questions, ask your nurse or doctor.          albuterol 108 (90 Base) MCG/ACT inhaler Commonly known as: VENTOLIN HFA TAKE 2 PUFFS BY MOUTH EVERY 6 HOURS AS NEEDED FOR WHEEZE OR SHORTNESS OF BREATH   azelastine 0.05 % ophthalmic solution Commonly known as: OPTIVAR Place 1 drop into both eyes 2 (two) times daily.   Azelastine HCl 0.15 % Soln Commonly known as: Nurse, children's  1 spray into the nose 2 (two) times daily.   benzonatate 100 MG capsule Commonly known as: TESSALON Take 2 capsules (200 mg total) by mouth 3 (three) times daily as needed for cough.   buPROPion 150 MG 24 hr tablet Commonly known as: WELLBUTRIN XL Take 3 tablets (450 mg total) by mouth daily.   cholecalciferol 1000 units tablet Commonly known as: VITAMIN D Take 1,000 Units by mouth daily.   famotidine 40 MG tablet Commonly known as: PEPCID Take 1 tablet (40 mg total) by mouth daily.   fexofenadine 180 MG tablet Commonly known as: Allegra Allergy Take 1 tablet (180 mg total) by mouth daily.   fluticasone 50 MCG/ACT nasal spray Commonly known as: FLONASE Place 2 sprays into both nostrils daily.    HYDROcodone bit-homatropine 5-1.5 MG/5ML syrup Commonly known as: HYCODAN Take 5 mLs by mouth every 8 (eight) hours as needed for cough.   levocetirizine 5 MG tablet Commonly known as: Xyzal Take 1 tablet (5 mg total) by mouth every evening.   Mimvey 1-0.5 MG tablet Generic drug: estradiol-norethindrone Take 1 tablet by mouth daily.   montelukast 10 MG tablet Commonly known as: SINGULAIR TAKE 1 TABLET BY MOUTH EVERYDAY AT BEDTIME   pantoprazole 40 MG tablet Commonly known as: PROTONIX Take 1 tablet (40 mg total) by mouth daily.   vitamin C 1000 MG tablet Take 1,000 mg by mouth daily.   Wegovy 0.5 MG/0.5ML Soaj Generic drug: Semaglutide-Weight Management Inject 0.5 mg into the skin once a week.   zinc gluconate 50 MG tablet Take 50 mg by mouth daily.        Signature:  Chesley Mires, MD [place] Pager - [phone] 11/01/2021, 9:46 AM

## 2021-11-09 ENCOUNTER — Encounter: Payer: Self-pay | Admitting: Allergy & Immunology

## 2021-11-09 ENCOUNTER — Ambulatory Visit (INDEPENDENT_AMBULATORY_CARE_PROVIDER_SITE_OTHER): Payer: Managed Care, Other (non HMO) | Admitting: Allergy & Immunology

## 2021-11-09 VITALS — BP 142/90 | HR 115 | Temp 98.4°F | Resp 12 | Ht 63.39 in | Wt 197.2 lb

## 2021-11-09 DIAGNOSIS — J3089 Other allergic rhinitis: Secondary | ICD-10-CM | POA: Diagnosis not present

## 2021-11-09 DIAGNOSIS — R203 Hyperesthesia: Secondary | ICD-10-CM | POA: Diagnosis not present

## 2021-11-09 NOTE — Progress Notes (Signed)
NEW PATIENT  Date of Service/Encounter:  11/09/21  Consult requested by: Ann Held, DO   Assessment:   Perennial allergic rhinitis (dust mites, cat, dog)  Sensitive skin - likely flaring due to ongoing cat dander exposure as well as lack of moisturizing   Vicki Le is an absolutely delightful person.  She has ongoing inflammation of her skin secondary to inadequate barrier protection since she does not like to moisturize and ongoing exposure to cat dander and dust mites.  We are going to try to limit her exposures to see if we can get it low enough that her skin responds appropriately.  She is going to get some dust mite coverings for her bed.  Obviously getting rid of the cat is a nonstarter, so I did recommend that she use a HEPA filter to limit her exposure to higher amounts of cat dander.  She is open to doing this.  She is going to remain on her antihistamines.  Allergy shots are consideration in the future, but she wants to try some other options first.  We will see her again in 3 months to see how she is doing with this.  Plan/Recommendations:    Patient Instructions  1. Sensitive skin - MOISTURIZE!!!  - I think this will help a lot with the itchiness. - I know it is not your jam!   2. Perennial allergic rhinitis - Testing today showed: dust mites, cat, and dog. - Copy of test results provided.  - Avoidance measures provided. - GET an air filter for your bedroom at least AND dust mite coverings. - I know we are never getting rid of your cat! :-) - Continue with: Allegra (fexofenadine) '180mg'$  tablet once daily and Xyzal (levocetirizine) '5mg'$  tablet once daily - You can use an extra dose of the antihistamine, if needed, for breakthrough symptoms.  - Consider nasal saline rinses 1-2 times daily to remove allergens from the nasal cavities as well as help with mucous clearance (this is especially helpful to do before the nasal sprays are given) - Consider allergy shots  as a means of long-term control. - Allergy shots "re-train" and "reset" the immune system to ignore environmental allergens and decrease the resulting immune response to those allergens (sneezing, itchy watery eyes, runny nose, nasal congestion, etc).    - Allergy shots improve symptoms in 75-85% of patients.  - We can discuss more at the next appointment if the medications are not working for you.  3. Return in about 3 months (around 02/09/2022).     This note in its entirety was forwarded to the Provider who requested this consultation.  Subjective:   Vicki Le is a 56 y.o. female presenting today for evaluation of No chief complaint on file.   Vicki Le has a history of the following: Patient Active Problem List   Diagnosis Date Noted   Moderate obstructive sleep apnea 08/31/2021   Preventative health care 06/16/2021   Acute non-recurrent pansinusitis 04/25/2021   Environmental and seasonal allergies 12/15/2020   Rash 01/06/2018   Morbid obesity ([place]) 01/06/2018   Depression 12/31/2016   Allergic reaction 10/29/2016   Acute URI 05/06/2015   Viral URI with cough 11/29/2014   GERD (gastroesophageal reflux disease) 10/25/2014   Anxiety and depression 10/25/2014   Left sided numbness 10/11/2014   Gastroenteritis 06/25/2013   Obesity (BMI 30-39.9) 06/02/2013    History obtained from: chart review and patient.  Vicki Le was referred by Carollee Herter, Kendrick Fries  R, DO.     Vicki Le is a 56 y.o. female presenting for an evaluation of environmental allergies and skin issues .   Asthma/Respiratory Symptom History: She has a working diagnosis of asthma. She sees Dr. Halford Chessman.  She has albuterol that she uses as needed. Prednisone has helped. She was previously on Symbicort but she is no longer on this. Albuterol is only a PRN medication.   Allergic Rhinitis Symptom History: She has sneezing and itching eyes. This occurs all year long. She did have these before COVID and they  have not really worsened at all. This is just now being addressed.  She did have allergies as a child. She never had testing performed.   She was seeing Dr. Clide Dales. She actually had her tonsils taken out by him a long while ago. She has not seen ENT since then.  Skin Symptom History: She reports that she has hives frequently. She used to "worship" the sun. She went to tanning beds. She had melanoma in the past secondary to this. She now has urticaria from being in the sun. She has tried taking cetirizine daily. She is on famotidine once daily as as well Allegra in the morning and Xyzal. She reports that her face gets itchy. She is followed by Dr. Jari Pigg. Sh was told that her "body produces much histamine".  She thinks that she has had the skin issues for a period of 13 to 15 years.   Otherwise, there is no history of other atopic diseases, including drug allergies, stinging insect allergies, or contact dermatitis. There is no significant infectious history. Vaccinations are up to date.    Past Medical History: Patient Active Problem List   Diagnosis Date Noted   Moderate obstructive sleep apnea 08/31/2021   Preventative health care 06/16/2021   Acute non-recurrent pansinusitis 04/25/2021   Environmental and seasonal allergies 12/15/2020   Rash 01/06/2018   Morbid obesity ([place]) 01/06/2018   Depression 12/31/2016   Allergic reaction 10/29/2016   Acute URI 05/06/2015   Viral URI with cough 11/29/2014   GERD (gastroesophageal reflux disease) 10/25/2014   Anxiety and depression 10/25/2014   Left sided numbness 10/11/2014   Gastroenteritis 06/25/2013   Obesity (BMI 30-39.9) 06/02/2013    Medication List:  Allergies as of 11/09/2021       Reactions   Vicodin [hydrocodone-acetaminophen] Nausea And Vomiting        Medication List        Accurate as of November 09, 2021 11:59 PM. If you have any questions, ask your nurse or doctor.          STOP taking these medications     benzonatate 100 MG capsule Commonly known as: TESSALON Stopped by: Valentina Shaggy, MD   HYDROcodone bit-homatropine 5-1.5 MG/5ML syrup Commonly known as: HYCODAN Stopped by: Valentina Shaggy, MD       TAKE these medications    albuterol 108 (90 Base) MCG/ACT inhaler Commonly known as: VENTOLIN HFA TAKE 2 PUFFS BY MOUTH EVERY 6 HOURS AS NEEDED FOR WHEEZE OR SHORTNESS OF BREATH   azelastine 0.05 % ophthalmic solution Commonly known as: OPTIVAR Place 1 drop into both eyes 2 (two) times daily.   Azelastine HCl 0.15 % Soln Commonly known as: Astepro Place 1 spray into the nose 2 (two) times daily.   buPROPion 150 MG 24 hr tablet Commonly known as: WELLBUTRIN XL Take 3 tablets (450 mg total) by mouth daily.   cholecalciferol 1000 units tablet Commonly known as:  VITAMIN D Take 1,000 Units by mouth daily.   famotidine 40 MG tablet Commonly known as: PEPCID Take 1 tablet (40 mg total) by mouth daily.   fexofenadine 180 MG tablet Commonly known as: Allegra Allergy Take 1 tablet (180 mg total) by mouth daily.   fluticasone 50 MCG/ACT nasal spray Commonly known as: FLONASE Place 2 sprays into both nostrils daily.   levocetirizine 5 MG tablet Commonly known as: Xyzal Take 1 tablet (5 mg total) by mouth every evening.   Mimvey 1-0.5 MG tablet Generic drug: estradiol-norethindrone Take 1 tablet by mouth daily.   montelukast 10 MG tablet Commonly known as: SINGULAIR TAKE 1 TABLET BY MOUTH EVERYDAY AT BEDTIME   pantoprazole 40 MG tablet Commonly known as: PROTONIX Take 1 tablet (40 mg total) by mouth daily.   vitamin C 1000 MG tablet Take 1,000 mg by mouth daily.   Wegovy 0.5 MG/0.5ML Soaj Generic drug: Semaglutide-Weight Management Inject 0.5 mg into the skin once a week.   zinc gluconate 50 MG tablet Take 50 mg by mouth daily.        Birth History: non-contributory  Developmental History: non-contributory  Past Surgical History: Past  Surgical History:  Procedure Laterality Date   MELANOMA EXCISION  2013   skin cancer removal  August 14, 2011   2 lymph nodes removed as well   TONSILECTOMY/ADENOIDECTOMY WITH MYRINGOTOMY     WRIST SURGERY  1991/1192   ganglion cyst r wrist     Family History: Family History  Problem Relation Age of Onset   Allergic rhinitis Mother    Depression Mother    Kidney Stones Mother    Allergic rhinitis Father    Alzheimer's disease Father    Colon polyps Father        suspicious polyps and 8 in of colon removed ? colon cancer    Depression Father    Hypertension Father    Allergic rhinitis Sister    Allergic rhinitis Sister    Allergic rhinitis Sister    Breast cancer Paternal Aunt    Breast cancer Maternal Grandmother      Social History: Cystal lives at home with her family.  She lives in an apartment that is 57 years old.  There is vinyl plank throughout the apartment.  They have carpeting in the bedroom.  They have gas heating and central cooling.  There is 1 cat inside of the home.  There are no dust mite covers on the bedding.  There is no exposure.  She currently works for [employer] at [employer].  She has been there for 2-1/2 years.  There is no fume, chemical, or dust exposure.  There is no HEPA filter in the home.  They do not live near an interstate or industrial area.     Review of Systems  Constitutional: Negative.  Negative for chills, fever, malaise/fatigue and weight loss.  HENT:  Positive for congestion. Negative for ear discharge and ear pain.        Positive for postnasal drip.  Eyes:  Negative for pain, discharge and redness.  Respiratory:  Negative for cough, sputum production, shortness of breath and wheezing.   Cardiovascular: Negative.  Negative for chest pain and palpitations.  Gastrointestinal:  Negative for abdominal pain, heartburn, nausea and vomiting.  Skin:  Positive for itching and rash.  Neurological:  Negative for dizziness and  headaches.  Endo/Heme/Allergies:  Negative for environmental allergies. Does not bruise/bleed easily.       Objective:  Blood pressure (!) 142/90, pulse (!) 115, temperature 98.4 F (36.9 C), temperature source Temporal, resp. rate 12, height 5' 3.39" (1.61 m), weight 197 lb 3.2 oz (89.4 kg), SpO2 98 %. Body mass index is 34.51 kg/m.     Physical Exam Vitals reviewed.  Constitutional:      Appearance: She is well-developed.  HENT:     Head: Normocephalic and atraumatic.     Right Ear: Tympanic membrane, ear canal and external ear normal. No drainage, swelling or tenderness. Tympanic membrane is not injected, scarred, erythematous, retracted or bulging.     Left Ear: Tympanic membrane, ear canal and external ear normal. No drainage, swelling or tenderness. Tympanic membrane is not injected, scarred, erythematous, retracted or bulging.     Nose: No nasal deformity, septal deviation, mucosal edema or rhinorrhea.     Right Sinus: No maxillary sinus tenderness or frontal sinus tenderness.     Left Sinus: No maxillary sinus tenderness or frontal sinus tenderness.     Mouth/Throat:     Mouth: Mucous membranes are not pale and not dry.     Pharynx: Uvula midline.  Eyes:     General:        Right eye: No discharge.        Left eye: No discharge.     Conjunctiva/sclera: Conjunctivae normal.     Right eye: Right conjunctiva is not injected. No chemosis.    Left eye: Left conjunctiva is not injected. No chemosis.    Pupils: Pupils are equal, round, and reactive to light.  Cardiovascular:     Rate and Rhythm: Normal rate and regular rhythm.     Heart sounds: Normal heart sounds.  Pulmonary:     Effort: Pulmonary effort is normal. No tachypnea, accessory muscle usage or respiratory distress.     Breath sounds: Normal breath sounds. No wheezing, rhonchi or rales.  Chest:     Chest wall: No tenderness.  Abdominal:     Tenderness: There is no abdominal tenderness. There is no guarding  or rebound.  Lymphadenopathy:     Head:     Right side of head: No submandibular, tonsillar or occipital adenopathy.     Left side of head: No submandibular, tonsillar or occipital adenopathy.     Cervical: No cervical adenopathy.  Skin:    General: Skin is warm.     Capillary Refill: Capillary refill takes less than 2 seconds.     Coloration: Skin is not pale.     Findings: No abrasion, erythema, petechiae or rash. Rash is not papular, urticarial or vesicular.     Comments: Very dry skin.  Slightly erythematous over the entire body.  Neurological:     Mental Status: She is alert.      Diagnostic studies:    Allergy Studies:     Airborne Adult Perc - 11/09/21 1406     Time Antigen Placed 1412    Allergen Manufacturer Lavella Hammock    Location Back    Number of Test 59    1. Control-Buffer 50% Glycerol Negative    2. Control-Histamine 1 mg/ml 2+    3. Albumin saline Negative    4. [place] Negative    5. [place] Negative    6. Johnson Negative    7. [place] Blue Negative    8. Meadow Fescue Negative    9. Perennial Rye Negative    10. Sweet Vernal Negative    11. Timothy Negative    12. Cocklebur Negative  13. Burweed Marshelder Negative    14. Ragweed, short Negative    15. Ragweed, Giant Negative    16. Plantain,  English Negative    17. Lamb's Quarters Negative    18. Sheep Sorrell Negative    19. Rough Pigweed Negative    20. Marsh Elder, Rough Negative    21. Mugwort, Common Negative    22. Ash mix Negative    23. Birch mix Negative    24. Beech American Negative    25. Box, Elder Negative    26. Cedar, red Negative    27. Cottonwood, [place] Negative    28. Elm mix Negative    29. Hickory Negative    30. Maple mix Negative    31. Oak, [place] mix Negative    32. Pecan Pollen Negative    33. Pine mix Negative    34. Sycamore Eastern Negative    35. [place], Black Pollen Negative    36. Alternaria alternata Negative    37. Cladosporium Herbarum Negative     38. Aspergillus mix Negative    39. Penicillium mix Negative    40. Bipolaris sorokiniana (Helminthosporium) Negative    41. Drechslera spicifera (Curvularia) Negative    42. Mucor plumbeus Negative    43. Fusarium moniliforme Negative    44. Aureobasidium pullulans (pullulara) Negative    45. Rhizopus oryzae Negative    46. Botrytis cinera Negative    47. Epicoccum nigrum Negative    48. Phoma betae Negative    49. Candida Albicans Negative    50. Trichophyton mentagrophytes Negative    51. Mite, D Farinae  5,000 AU/ml Negative    52. Mite, D Pteronyssinus  5,000 AU/ml Negative    53. Cat Hair 10,000 BAU/ml Negative    54.  Dog Epithelia Negative    55. Mixed Feathers Negative    56. Horse Epithelia Negative    57. Cockroach, German Negative    58. Mouse Negative    59. Tobacco Leaf Negative             Intradermal - 11/09/21 1513     Time Antigen Placed 1450    Allergen Manufacturer Lavella Hammock    Location Arm    Number of Test 15    Intradermal Select    Control Negative    [place] Negative    Johnson Negative    7 Grass Negative    Ragweed mix Negative    Weed mix Negative    Tree mix Negative    Mold 1 Negative    Mold 2 Negative    Mold 3 Negative    Mold 4 Negative    Cat 4+    Dog 2+    Cockroach Negative    Mite mix 3+             Allergy testing results were read and interpreted by myself, documented by clinical staff.         Salvatore Marvel, MD [facility]

## 2021-11-09 NOTE — Patient Instructions (Addendum)
1. Sensitive skin - MOISTURIZE!!!  - I think this will help a lot with the itchiness. - I know it is not your jam!   2. Perennial allergic rhinitis - Testing today showed: dust mites, cat, and dog. - Copy of test results provided.  - Avoidance measures provided. - GET an air filter for your bedroom at least AND dust mite coverings. - I know we are never getting rid of your cat! :-) - Continue with: Allegra (fexofenadine) '180mg'$  tablet once daily and Xyzal (levocetirizine) '5mg'$  tablet once daily - You can use an extra dose of the antihistamine, if needed, for breakthrough symptoms.  - Consider nasal saline rinses 1-2 times daily to remove allergens from the nasal cavities as well as help with mucous clearance (this is especially helpful to do before the nasal sprays are given) - Consider allergy shots as a means of long-term control. - Allergy shots "re-train" and "reset" the immune system to ignore environmental allergens and decrease the resulting immune response to those allergens (sneezing, itchy watery eyes, runny nose, nasal congestion, etc).    - Allergy shots improve symptoms in 75-85% of patients.  - We can discuss more at the next appointment if the medications are not working for you.  3. Return in about 3 months (around 02/09/2022).    Please inform us of any Emergency Department visits, hospitalizations, or changes in symptoms. Call us before going to the ED for breathing or allergy symptoms since we might be able to fit you in for a sick visit. Feel free to contact us anytime with any questions, problems, or concerns.  It was a pleasure to meet you today!  Websites that have reliable patient information: 1. American Academy of Asthma, Allergy, and Immunology: www.aaaai.org 2. Food Allergy Research and Education (FARE): foodallergy.org 3. Mothers of Asthmatics: http://www.asthmacommunitynetwork.org 4. American College of Allergy, Asthma, and Immunology:  www.acaai.org   COVID-19 Vaccine Information can be found at: [email or web address].[email or web address].uk For questions related to vaccine distribution or appointments, please email [email or web address] or call [phone].   We realize that you might be concerned about having an allergic reaction to the COVID19 vaccines. To help with that concern, WE ARE OFFERING THE COVID19 VACCINES IN OUR OFFICE! Ask the front desk for dates!     "Like" [place] on Facebook and Instagram for our latest updates!      A healthy democracy works best when [employer] participate! Make sure you are registered to vote! If you have moved or changed any of your contact information, you will need to get this updated before voting!  In some cases, you MAY be able to register to vote online: [email or web address]        Airborne Adult Perc - 11/09/21 1406     Time Antigen Placed [place] Lavella Hammock    Location Back    Number of Test 59    1. Control-Buffer 50% Glycerol Negative    2. Control-Histamine 1 mg/ml 2+    3. Albumin saline Negative    4. [place] Negative    5. [place] Negative    6. Johnson Negative    7. [place] Blue Negative    8. Meadow Fescue Negative    9. Perennial Rye Negative    10. Sweet Vernal Negative    11. Timothy Negative    12. Cocklebur Negative    13. Burweed Marshelder Negative    14. Ragweed, short Negative    15. Ragweed, Giant Negative    16.  Plantain,  English Negative    17. Lamb's Quarters Negative    18. Sheep Sorrell Negative    19. Rough Pigweed Negative    20. Marsh Elder, Rough Negative    21. Mugwort, Common Negative    22. Ash mix Negative    23. Birch mix Negative    24. Beech American Negative    25. Box, Elder Negative    26. Cedar, red Negative    27. Cottonwood, [place] Negative    28. Elm mix Negative    29. Hickory Negative    30. Maple mix Negative    31. Oak,  [place] mix Negative    32. Pecan Pollen Negative    33. Pine mix Negative    34. Sycamore Eastern Negative    35. [place], Black Pollen Negative    36. Alternaria alternata Negative    37. Cladosporium Herbarum Negative    38. Aspergillus mix Negative    39. Penicillium mix Negative    40. Bipolaris sorokiniana (Helminthosporium) Negative    41. Drechslera spicifera (Curvularia) Negative    42. Mucor plumbeus Negative    43. Fusarium moniliforme Negative    44. Aureobasidium pullulans (pullulara) Negative    45. Rhizopus oryzae Negative    46. Botrytis cinera Negative    47. Epicoccum nigrum Negative    48. Phoma betae Negative    49. Candida Albicans Negative    50. Trichophyton mentagrophytes Negative    51. Mite, D Farinae  5,000 AU/ml Negative    52. Mite, D Pteronyssinus  5,000 AU/ml Negative    53. Cat Hair 10,000 BAU/ml Negative    54.  Dog Epithelia Negative    55. Mixed Feathers Negative    56. Horse Epithelia Negative    57. Cockroach, German Negative    58. Mouse Negative    59. Tobacco Leaf Negative             Intradermal - 11/09/21 1513     Time Antigen Placed 1450    Allergen Manufacturer Lavella Hammock    Location Arm    Number of Test 15    Intradermal Select    Control Negative    [place] Negative    Johnson Negative    7 Grass Negative    Ragweed mix Negative    Weed mix Negative    Tree mix Negative    Mold 1 Negative    Mold 2 Negative    Mold 3 Negative    Mold 4 Negative    Cat 4+    Dog 2+    Cockroach Negative    Mite mix 3+              Control of Dog or Cat Allergen  Avoidance is the best way to manage a dog or cat allergy. If you have a dog or cat and are allergic to dog or cats, consider removing the dog or cat from the home. If you have a dog or cat but don't want to find it a new home, or if your family wants a pet even though someone in the household is allergic, here are some strategies that may help keep symptoms at  bay:  Keep the pet out of your bedroom and restrict it to only a few rooms. Be advised that keeping the dog or cat in only one room will not limit the allergens to that room. Don't pet, hug or kiss the dog or cat; if you do, wash your  hands with soap and water. High-efficiency particulate air (HEPA) cleaners run continuously in a bedroom or living room can reduce allergen levels over time. Regular use of a high-efficiency vacuum cleaner or a central vacuum can reduce allergen levels. Giving your dog or cat a bath at least once a week can reduce airborne allergen.   Control of Dust Mite Allergen    Dust mites play a major role in allergic asthma and rhinitis.  They occur in environments with high humidity wherever human skin is found.  Dust mites absorb humidity from the atmosphere (ie, they do not drink) and feed on organic matter (including shed human and animal skin).  Dust mites are a microscopic type of insect that you cannot see with the naked eye.  High levels of dust mites have been detected from mattresses, pillows, carpets, upholstered furniture, bed covers, clothes, soft toys and any woven material.  The principal allergen of the dust mite is found in its feces.  A gram of dust may contain 1,000 mites and 250,000 fecal particles.  Mite antigen is easily measured in the air during house cleaning activities.  Dust mites do not bite and do not cause harm to humans, other than by triggering allergies/asthma.    Ways to decrease your exposure to dust mites in your home:  Encase mattresses, box springs and pillows with a mite-impermeable barrier or cover   Wash sheets, blankets and drapes weekly in hot water (130 F) with detergent and dry them in a dryer on the hot setting.  Have the room cleaned frequently with a vacuum cleaner and a damp dust-mop.  For carpeting or rugs, vacuuming with a vacuum cleaner equipped with a high-efficiency particulate air (HEPA) filter.  The dust mite allergic  individual should not be in a room which is being cleaned and should wait 1 hour after cleaning before going into the room. Do not sleep on upholstered furniture (eg, couches).   If possible removing carpeting, upholstered furniture and drapery from the home is ideal.  Horizontal blinds should be eliminated in the rooms where the person spends the most time (bedroom, study, television room).  Washable vinyl, roller-type shades are optimal. Remove all non-washable stuffed toys from the bedroom.  Wash stuffed toys weekly like sheets and blankets above.   Reduce indoor humidity to less than 50%.  Inexpensive humidity monitors can be purchased at most hardware stores.  Do not use a humidifier as can make the problem worse and are not recommended.

## 2021-11-10 ENCOUNTER — Encounter: Payer: Self-pay | Admitting: Allergy & Immunology

## 2021-11-23 ENCOUNTER — Encounter: Payer: Self-pay | Admitting: Family Medicine

## 2021-11-28 ENCOUNTER — Encounter (HOSPITAL_BASED_OUTPATIENT_CLINIC_OR_DEPARTMENT_OTHER): Payer: Self-pay

## 2021-11-28 ENCOUNTER — Other Ambulatory Visit (HOSPITAL_BASED_OUTPATIENT_CLINIC_OR_DEPARTMENT_OTHER): Payer: Self-pay

## 2021-11-28 MED ORDER — WEGOVY 0.25 MG/0.5ML ~~LOC~~ SOAJ
0.2500 mg | SUBCUTANEOUS | 0 refills | Status: DC
  Filled 2021-11-28: qty 2, 28d supply, fill #0

## 2021-12-11 ENCOUNTER — Other Ambulatory Visit: Payer: Self-pay | Admitting: Family Medicine

## 2021-12-11 DIAGNOSIS — K219 Gastro-esophageal reflux disease without esophagitis: Secondary | ICD-10-CM

## 2021-12-20 ENCOUNTER — Other Ambulatory Visit: Payer: Self-pay | Admitting: Family Medicine

## 2021-12-21 ENCOUNTER — Other Ambulatory Visit (HOSPITAL_BASED_OUTPATIENT_CLINIC_OR_DEPARTMENT_OTHER): Payer: Self-pay

## 2021-12-21 MED ORDER — WEGOVY 0.25 MG/0.5ML ~~LOC~~ SOAJ
0.2500 mg | SUBCUTANEOUS | 0 refills | Status: DC
  Filled 2021-12-21 – 2022-01-12 (×2): qty 2, 28d supply, fill #0

## 2022-01-01 ENCOUNTER — Other Ambulatory Visit (HOSPITAL_BASED_OUTPATIENT_CLINIC_OR_DEPARTMENT_OTHER): Payer: Self-pay

## 2022-01-12 ENCOUNTER — Other Ambulatory Visit (HOSPITAL_BASED_OUTPATIENT_CLINIC_OR_DEPARTMENT_OTHER): Payer: Self-pay

## 2022-01-25 ENCOUNTER — Other Ambulatory Visit (HOSPITAL_BASED_OUTPATIENT_CLINIC_OR_DEPARTMENT_OTHER): Payer: Self-pay

## 2022-02-09 ENCOUNTER — Other Ambulatory Visit (HOSPITAL_BASED_OUTPATIENT_CLINIC_OR_DEPARTMENT_OTHER): Payer: Self-pay

## 2022-02-11 ENCOUNTER — Encounter: Payer: Self-pay | Admitting: Family Medicine

## 2022-02-17 ENCOUNTER — Other Ambulatory Visit (HOSPITAL_BASED_OUTPATIENT_CLINIC_OR_DEPARTMENT_OTHER): Payer: Self-pay

## 2022-02-21 ENCOUNTER — Other Ambulatory Visit (HOSPITAL_BASED_OUTPATIENT_CLINIC_OR_DEPARTMENT_OTHER): Payer: Self-pay

## 2022-03-15 ENCOUNTER — Encounter: Payer: Self-pay | Admitting: Family Medicine

## 2022-03-15 ENCOUNTER — Other Ambulatory Visit (HOSPITAL_BASED_OUTPATIENT_CLINIC_OR_DEPARTMENT_OTHER): Payer: Self-pay

## 2022-03-15 MED ORDER — WEGOVY 0.5 MG/0.5ML ~~LOC~~ SOAJ
0.5000 mg | SUBCUTANEOUS | 0 refills | Status: DC
  Filled 2022-03-15: qty 2, 28d supply, fill #0

## 2022-03-16 ENCOUNTER — Other Ambulatory Visit (HOSPITAL_BASED_OUTPATIENT_CLINIC_OR_DEPARTMENT_OTHER): Payer: Self-pay

## 2022-03-26 ENCOUNTER — Encounter: Payer: Self-pay | Admitting: Family Medicine

## 2022-03-26 ENCOUNTER — Ambulatory Visit: Payer: Managed Care, Other (non HMO) | Admitting: Family Medicine

## 2022-03-26 ENCOUNTER — Other Ambulatory Visit (HOSPITAL_BASED_OUTPATIENT_CLINIC_OR_DEPARTMENT_OTHER): Payer: Self-pay

## 2022-03-26 DIAGNOSIS — F32A Depression, unspecified: Secondary | ICD-10-CM | POA: Diagnosis not present

## 2022-03-26 DIAGNOSIS — F419 Anxiety disorder, unspecified: Secondary | ICD-10-CM | POA: Diagnosis not present

## 2022-03-26 MED ORDER — WEGOVY 0.5 MG/0.5ML ~~LOC~~ SOAJ
0.5000 mg | SUBCUTANEOUS | 0 refills | Status: DC
  Filled 2022-03-26: qty 2, 28d supply, fill #0

## 2022-03-26 NOTE — Assessment & Plan Note (Signed)
Wegovy 0.5 mg weekly  Con't diet and exercise  F/u 3 months

## 2022-03-26 NOTE — Assessment & Plan Note (Signed)
Con't wellbutrin  F/u 3 months

## 2022-03-26 NOTE — Patient Instructions (Signed)
Calorie Counting for Weight Loss Calories are units of energy. Your body needs a certain number of calories from food to keep going throughout the day. When you eat or drink more calories than your body needs, your body stores the extra calories mostly as fat. When you eat or drink fewer calories than your body needs, your body Kray fat to get the energy it needs. Calorie counting means keeping track of how many calories you eat and drink each day. Calorie counting can be helpful if you need to lose weight. If you eat fewer calories than your body needs, you should lose weight. Ask your health care provider what a healthy weight is for you. For calorie counting to work, you will need to eat the right number of calories each day to lose a healthy amount of weight per week. A dietitian can help you figure out how many calories you need in a day and will suggest ways to reach your calorie goal. A healthy amount of weight to lose each week is usually 1-2 lb (0.5-0.9 kg). This usually means that your daily calorie intake should be reduced by 500-750 calories. Eating 1,200-1,500 calories a day can help most women lose weight. Eating 1,500-1,800 calories a day can help most men lose weight. What do I need to know about calorie counting? Work with your health care provider or dietitian to determine how many calories you should get each day. To meet your daily calorie goal, you will need to: Find out how many calories are in each food that you would like to eat. Try to do this before you eat. Decide how much of the food you plan to eat. Keep a food log. Do this by writing down what you ate and how many calories it had. To successfully lose weight, it is important to balance calorie counting with a healthy lifestyle that includes regular activity. Where do I find calorie information?  The number of calories in a food can be found on a Nutrition Facts label. If a food does not have a Nutrition Facts label, try  to look up the calories online or ask your dietitian for help. Remember that calories are listed per serving. If you choose to have more than one serving of a food, you will have to multiply the calories per serving by the number of servings you plan to eat. For example, the label on a package of bread might say that a serving size is 1 slice and that there are 90 calories in a serving. If you eat 1 slice, you will have eaten 90 calories. If you eat 2 slices, you will have eaten 180 calories. How do I keep a food log? After each time that you eat, record the following in your food log as soon as possible: What you ate. Be sure to include toppings, sauces, and other extras on the food. How much you ate. This can be measured in cups, ounces, or number of items. How many calories were in each food and drink. The total number of calories in the food you ate. Keep your food log near you, such as in a pocket-sized notebook or on an app or website on your mobile phone. Some programs will calculate calories for you and show you how many calories you have left to meet your daily goal. What are some portion-control tips? Know how many calories are in a serving. This will help you know how many servings you can have of a certain   food. Use a measuring cup to measure serving sizes. You could also try weighing out portions on a kitchen scale. With time, you will be able to estimate serving sizes for some foods. Take time to put servings of different foods on your favorite plates or in your favorite bowls and cups so you know what a serving looks like. Try not to eat straight from a food's packaging, such as from a bag or box. Eating straight from the package makes it hard to see how much you are eating and can lead to overeating. Put the amount you would like to eat in a cup or on a plate to make sure you are eating the right portion. Use smaller plates, glasses, and bowls for smaller portions and to prevent  overeating. Try not to multitask. For example, avoid watching TV or using your computer while eating. If it is time to eat, sit down at a table and enjoy your food. This will help you recognize when you are full. It will also help you be more mindful of what and how much you are eating. What are tips for following this plan? Reading food labels Check the calorie count compared with the serving size. The serving size may be smaller than what you are used to eating. Check the source of the calories. Try to choose foods that are high in protein, fiber, and vitamins, and low in saturated fat, trans fat, and sodium. Shopping Read nutrition labels while you shop. This will help you make healthy decisions about which foods to buy. Pay attention to nutrition labels for low-fat or fat-free foods. These foods sometimes have the same number of calories or more calories than the full-fat versions. They also often have added sugar, starch, or salt to make up for flavor that was removed with the fat. Make a grocery list of lower-calorie foods and stick to it. Cooking Try to cook your favorite foods in a healthier way. For example, try baking instead of frying. Use low-fat dairy products. Meal planning Use more fruits and vegetables. One-half of your plate should be fruits and vegetables. Include lean proteins, such as chicken, turkey, and fish. Lifestyle Each week, aim to do one of the following: 150 minutes of moderate exercise, such as walking. 75 minutes of vigorous exercise, such as running. General information Know how many calories are in the foods you eat most often. This will help you calculate calorie counts faster. Find a way of tracking calories that works for you. Get creative. Try different apps or programs if writing down calories does not work for you. What foods should I eat?  Eat nutritious foods. It is better to have a nutritious, high-calorie food, such as an avocado, than a food with  few nutrients, such as a bag of potato chips. Use your calories on foods and drinks that will fill you up and will not leave you hungry soon after eating. Examples of foods that fill you up are nuts and nut butters, vegetables, lean proteins, and high-fiber foods such as whole grains. High-fiber foods are foods with more than 5 g of fiber per serving. Pay attention to calories in drinks. Low-calorie drinks include water and unsweetened drinks. The items listed above may not be a complete list of foods and beverages you can eat. Contact a dietitian for more information. What foods should I limit? Limit foods or drinks that are not good sources of vitamins, minerals, or protein or that are high in unhealthy fats. These   include: Candy. Other sweets. Sodas, specialty coffee drinks, alcohol, and juice. The items listed above may not be a complete list of foods and beverages you should avoid. Contact a dietitian for more information. How do I count calories when eating out? Pay attention to portions. Often, portions are much larger when eating out. Try these tips to keep portions smaller: Consider sharing a meal instead of getting your own. If you get your own meal, eat only half of it. Before you start eating, ask for a container and put half of your meal into it. When available, consider ordering smaller portions from the menu instead of full portions. Pay attention to your food and drink choices. Knowing the way food is cooked and what is included with the meal can help you eat fewer calories. If calories are listed on the menu, choose the lower-calorie options. Choose dishes that include vegetables, fruits, whole grains, low-fat dairy products, and lean proteins. Choose items that are boiled, broiled, grilled, or steamed. Avoid items that are buttered, battered, fried, or served with cream sauce. Items labeled as crispy are usually fried, unless stated otherwise. Choose water, low-fat milk,  unsweetened iced tea, or other drinks without added sugar. If you want an alcoholic beverage, choose a lower-calorie option, such as a glass of wine or light beer. Ask for dressings, sauces, and syrups on the side. These are usually high in calories, so you should limit the amount you eat. If you want a salad, choose a garden salad and ask for grilled meats. Avoid extra toppings such as bacon, cheese, or fried items. Ask for the dressing on the side, or ask for olive oil and vinegar or lemon to use as dressing. Estimate how many servings of a food you are given. Knowing serving sizes will help you be aware of how much food you are eating at restaurants. Where to find more information Centers for Disease Control and Prevention: www.cdc.gov U.[place]. Department of Agriculture: myplate.gov Summary Calorie counting means keeping track of how many calories you eat and drink each day. If you eat fewer calories than your body needs, you should lose weight. A healthy amount of weight to lose per week is usually 1-2 lb (0.5-0.9 kg). This usually means reducing your daily calorie intake by 500-750 calories. The number of calories in a food can be found on a Nutrition Facts label. If a food does not have a Nutrition Facts label, try to look up the calories online or ask your dietitian for help. Use smaller plates, glasses, and bowls for smaller portions and to prevent overeating. Use your calories on foods and drinks that will fill you up and not leave you hungry shortly after a meal. This information is not intended to replace advice given to you by your health care provider. Make sure you discuss any questions you have with your health care provider. Document Revised: 03/05/2019 Document Reviewed: 03/05/2019 Elsevier Patient Education  2023 Elsevier Inc.  

## 2022-03-26 NOTE — Progress Notes (Signed)
Subjective:   By signing my name below, I, Vicki Le, attest that this documentation has been prepared under the direction and in the presence of Ann Held, DO 03/26/22   Patient ID: Vicki Le, female    DOB: 16-Nov-1965, 57 y.o.   MRN: [id number]  Chief Complaint  Patient presents with   Weight Check    HPI Patient is in today for an office visit.   She has not been able to take [facility] for about 2 weeks. She was able to take it for a month. She stated that the pharmacy was overcharging her due to insurance issues. She reports that while on the [facility] she was able to lose about 8 lbs.   Past Medical History:  Diagnosis Date   Anxiety    Chicken pox    Depression    GERD (gastroesophageal reflux disease)    Melanoma (HCC)    left buttock   Urticaria     Past Surgical History:  Procedure Laterality Date   MELANOMA EXCISION  2013   skin cancer removal  August 14, 2011   2 lymph nodes removed as well   TONSILECTOMY/ADENOIDECTOMY WITH MYRINGOTOMY     WRIST SURGERY  1991/1192   ganglion cyst r wrist    Family History  Problem Relation Age of Onset   Allergic rhinitis Mother    Depression Mother    Kidney Stones Mother    Allergic rhinitis Father    Alzheimer's disease Father    Colon polyps Father        suspicious polyps and 8 in of colon removed ? colon cancer    Depression Father    Hypertension Father    Allergic rhinitis Sister    Allergic rhinitis Sister    Allergic rhinitis Sister    Breast cancer Paternal Aunt    Breast cancer Maternal Grandmother     Social History   Socioeconomic History   Marital status: Married    Spouse name: Not on file   Number of children: 0   Years of education: Not on file   Highest education level: Not on file  Occupational History   Occupation: Marketing executive  Tobacco Use   Smoking status: Never    Passive exposure: Never   Smokeless tobacco: Never  Vaping Use   Vaping Use: Never used  Substance  and Sexual Activity   Alcohol use: Yes    Comment: occassionally   Drug use: No   Sexual activity: Yes    Partners: Male  Other Topics Concern   Not on file  Social History Narrative   No exercise   Social Determinants of Health   Financial Resource Strain: Not on file  Food Insecurity: Not on file  Transportation Needs: Not on file  Physical Activity: Not on file  Stress: Not on file  Social Connections: Not on file  Intimate Partner Violence: Not on file    Outpatient Medications Prior to Visit  Medication Sig Dispense Refill   albuterol (VENTOLIN HFA) 108 (90 Base) MCG/ACT inhaler TAKE 2 PUFFS BY MOUTH EVERY 6 HOURS AS NEEDED FOR WHEEZE OR SHORTNESS OF BREATH 8.5 each 2   Ascorbic Acid (VITAMIN C) 1000 MG tablet Take 1,000 mg by mouth daily.     azelastine (OPTIVAR) 0.05 % ophthalmic solution Place 1 drop into both eyes 2 (two) times daily. 6 mL 12   Azelastine HCl (ASTEPRO) 0.15 % SOLN Place 1 spray into the nose 2 (two) times daily.  buPROPion (WELLBUTRIN XL) 150 MG 24 hr tablet Take 3 tablets (450 mg total) by mouth daily. 270 tablet 1   cholecalciferol (VITAMIN D) 1000 UNITS tablet Take 1,000 Units by mouth daily.     famotidine (PEPCID) 40 MG tablet Take 1 tablet (40 mg total) by mouth daily. 90 tablet 3   fexofenadine (ALLEGRA ALLERGY) 180 MG tablet Take 1 tablet (180 mg total) by mouth daily.     fluticasone (FLONASE) 50 MCG/ACT nasal spray Place 2 sprays into both nostrils daily. 48 g 3   levocetirizine (XYZAL) 5 MG tablet Take 1 tablet (5 mg total) by mouth every evening. 30 tablet 5   MIMVEY 1-0.5 MG tablet Take 1 tablet by mouth daily.     montelukast (SINGULAIR) 10 MG tablet TAKE 1 TABLET BY MOUTH EVERYDAY AT BEDTIME 90 tablet 1   pantoprazole (PROTONIX) 40 MG tablet Take 1 tablet (40 mg total) by mouth daily. 90 tablet 2   zinc gluconate 50 MG tablet Take 50 mg by mouth daily.     Semaglutide-Weight Management (WEGOVY) 0.5 MG/0.5ML SOAJ Inject 0.5 mg into the  skin once a week. 2 mL 0   No facility-administered medications prior to visit.    Allergies  Allergen Reactions   Vicodin [Hydrocodone-Acetaminophen] Nausea And Vomiting    Review of Systems  Constitutional:  Negative for fever and malaise/fatigue.  HENT:  Negative for congestion.   Eyes:  Negative for blurred vision.  Respiratory:  Negative for shortness of breath.   Cardiovascular:  Negative for chest pain, palpitations and leg swelling.  Gastrointestinal:  Negative for abdominal pain, blood in stool and nausea.  Genitourinary:  Negative for dysuria and frequency.  Musculoskeletal:  Negative for falls.  Skin:  Negative for rash.  Neurological:  Negative for dizziness, loss of consciousness and headaches.  Endo/Heme/Allergies:  Negative for environmental allergies.  Psychiatric/Behavioral:  Negative for depression. The patient is not nervous/anxious.        Objective:    Physical Exam Vitals and nursing note reviewed.  Constitutional:      Appearance: She is well-developed.  HENT:     Head: Normocephalic and atraumatic.  Eyes:     Conjunctiva/sclera: Conjunctivae normal.  Neck:     Thyroid: No thyromegaly.     Vascular: No carotid bruit or JVD.  Cardiovascular:     Rate and Rhythm: Normal rate and regular rhythm.     Heart sounds: Normal heart sounds. No murmur heard. Pulmonary:     Effort: Pulmonary effort is normal. No respiratory distress.     Breath sounds: Normal breath sounds. No wheezing or rales.  Chest:     Chest wall: No tenderness.  Musculoskeletal:     Cervical back: Normal range of motion and neck supple.  Neurological:     Mental Status: She is alert and oriented to person, place, and time.     BP 138/88 (BP Location: Left Arm, Patient Position: Sitting, Cuff Size: Normal)   Pulse 84   Temp 98.6 F (37 C) (Oral)   Resp 18   Ht 5' 3.39" (1.61 m)   Wt 197 lb 3.2 oz (89.4 kg)   SpO2 98%   BMI 34.50 kg/m  Wt Readings from Last 3 Encounters:   03/26/22 197 lb 3.2 oz (89.4 kg)  11/09/21 197 lb 3.2 oz (89.4 kg)  11/01/21 199 lb 3.2 oz (90.4 kg)       Assessment & Plan:  Morbid obesity ([place]) Assessment & Plan: Wegovy 0.5 mg  weekly  Con't diet and exercise  F/u 3 months   Orders: [id number]; Inject 0.5 mg into the skin once a week.  Dispense: 2 mL; Refill: 0  Depression, unspecified depression type  Anxiety and depression Assessment & Plan: Con't wellbutrin  F/u 3 months       I,Rachel Rivera,acting as a scribe for Ann Held, DO.,have documented all relevant documentation on the behalf of Ann Held, DO,as directed by  Ann Held, DO while in the presence of Ann Held, DO.   I, Ann Held, DO, personally preformed the services described in this documentation.  All medical record entries made by the scribe were at my direction and in my presence.  I have reviewed the chart and discharge instructions (if applicable) and agree that the record reflects my personal performance and is accurate and complete. 03/26/22   Ann Held, DO

## 2022-03-27 ENCOUNTER — Telehealth: Payer: Self-pay

## 2022-03-27 NOTE — Telephone Encounter (Signed)
Mychart message sent to Pt to make her aware of below.

## 2022-03-27 NOTE — Telephone Encounter (Signed)
PA initiated via Covermymeds; KEY: [id number].   Drug is covered by current benefit plan. No further PA activity needed.  Vicki Le is unable to respond with clinical questions. Please contact [employer] at [phone].

## 2022-03-27 NOTE — Telephone Encounter (Signed)
Spoke w/ Drea at [facility]- informed that Vicki Le is not covered under plan, they require Pt's to use copay cards. She did inform me to have Pt call member services and speak with a team member- they have programs that Pt may qualify for to get medication at lower/reduced cost.

## 2022-03-30 ENCOUNTER — Other Ambulatory Visit (HOSPITAL_BASED_OUTPATIENT_CLINIC_OR_DEPARTMENT_OTHER): Payer: Self-pay

## 2022-05-11 ENCOUNTER — Other Ambulatory Visit: Payer: Self-pay | Admitting: Family Medicine

## 2022-05-11 DIAGNOSIS — F32A Depression, unspecified: Secondary | ICD-10-CM

## 2022-05-24 ENCOUNTER — Encounter: Payer: Self-pay | Admitting: Gastroenterology

## 2022-05-24 ENCOUNTER — Ambulatory Visit (AMBULATORY_SURGERY_CENTER): Payer: Managed Care, Other (non HMO)

## 2022-05-24 VITALS — Ht 64.0 in | Wt 195.0 lb

## 2022-05-24 DIAGNOSIS — Z86010 Personal history of colonic polyps: Secondary | ICD-10-CM

## 2022-05-24 DIAGNOSIS — Z800 Family history of malignant neoplasm of digestive organs: Secondary | ICD-10-CM

## 2022-05-24 DIAGNOSIS — Z83719 Family history of colon polyps, unspecified: Secondary | ICD-10-CM

## 2022-05-24 MED ORDER — NA SULFATE-K SULFATE-MG SULF 17.5-3.13-1.6 GM/177ML PO SOLN
1.0000 | Freq: Once | ORAL | 0 refills | Status: AC
Start: 2022-05-24 — End: 2022-05-24

## 2022-05-24 NOTE — Progress Notes (Signed)
No egg or soy allergy known to patient  No issues known to pt with past sedation with any surgeries or procedures Patient denies ever being told they had issues or difficulty with intubation  No FH of Malignant Hyperthermia Pt is not on diet pills Pt is not on  home 02  Pt is not on blood thinners  Pt denies issues with constipation  No A fib or A flutter Have any cardiac testing pending--no Pt instructed to use [email or web address] or GoodRx for a price reduction on prep   

## 2022-05-30 ENCOUNTER — Ambulatory Visit: Payer: Managed Care, Other (non HMO) | Admitting: Family

## 2022-05-30 DIAGNOSIS — J302 Other seasonal allergic rhinitis: Secondary | ICD-10-CM

## 2022-05-30 MED ORDER — MONTELUKAST SODIUM 10 MG PO TABS
ORAL_TABLET | ORAL | 1 refills | Status: DC
Start: 2022-05-30 — End: 2022-12-09

## 2022-05-30 NOTE — Progress Notes (Signed)
Subjective:   By signing my name below, I, Barrett Shell, attest that this documentation has been prepared under the direction and in the presence of Sandford Craze, NP. 05/30/2022   Patient ID: Vicki Le, female    DOB: 12/30/1965, 57 y.o.   MRN: [id number]  Chief Complaint  Patient presents with   Sore Throat    Complains of sore throat for 3 days   Fatigue    Complains of fatigue   Facial Pain    Sinus pain    HPI Patient is in today for an office visit.   Sore throat: She complains of a sore throat, sinus pressure, fatigue, and facial swelling for the past few days. She took a couple Covid tests at home and tested negative each time. She also has allergies and is unsure if these symptoms are associated.  Past Medical History:  Diagnosis Date   Anxiety    Chicken pox    Depression    GERD (gastroesophageal reflux disease)    Melanoma    left buttock   Sleep apnea    Urticaria     Past Surgical History:  Procedure Laterality Date   MELANOMA EXCISION  2013   skin cancer removal  August 14, 2011   2 lymph nodes removed as well   TONSILECTOMY/ADENOIDECTOMY WITH MYRINGOTOMY     WRIST SURGERY  1991/1192   ganglion cyst r wrist    Family History  Problem Relation Age of Onset   Allergic rhinitis Mother    Depression Mother    Kidney Stones Mother    Allergic rhinitis Father    Alzheimer's disease Father    Colon polyps Father        suspicious polyps and 8 in of colon removed ? colon cancer    Depression Father    Hypertension Father    Allergic rhinitis Sister    Allergic rhinitis Sister    Allergic rhinitis Sister    Breast cancer Paternal Aunt    Breast cancer Maternal Grandmother    Colon cancer Neg Hx    Esophageal cancer Neg Hx    Rectal cancer Neg Hx    Stomach cancer Neg Hx     Social History   Socioeconomic History   Marital status: Married    Spouse name: Not on file   Number of children: 0   Years of education: Not on file    Highest education level: Not on file  Occupational History   Occupation: Advertising account planner  Tobacco Use   Smoking status: Never    Passive exposure: Never   Smokeless tobacco: Never  Vaping Use   Vaping Use: Never used  Substance and Sexual Activity   Alcohol use: Yes    Comment: occassionally   Drug use: No   Sexual activity: Yes    Partners: Male  Other Topics Concern   Not on file  Social History Narrative   No exercise   Social Determinants of Health   Financial Resource Strain: Not on file  Food Insecurity: Not on file  Transportation Needs: Not on file  Physical Activity: Not on file  Stress: Not on file  Social Connections: Not on file  Intimate Partner Violence: Not on file    Outpatient Medications Prior to Visit  Medication Sig Dispense Refill   albuterol (VENTOLIN HFA) 108 (90 Base) MCG/ACT inhaler TAKE 2 PUFFS BY MOUTH EVERY 6 HOURS AS NEEDED FOR WHEEZE OR SHORTNESS OF BREATH 8.5 each 2  Ascorbic Acid (VITAMIN C) 1000 MG tablet Take 1,000 mg by mouth daily.     azelastine (OPTIVAR) 0.05 % ophthalmic solution Place 1 drop into both eyes 2 (two) times daily. 6 mL 12   Azelastine HCl (ASTEPRO) 0.15 % SOLN Place 1 spray into the nose 2 (two) times daily.     buPROPion (WELLBUTRIN XL) 150 MG 24 hr tablet TAKE 3 TABLETS BY MOUTH DAILY. 270 tablet 1   cholecalciferol (VITAMIN D) 1000 UNITS tablet Take 1,000 Units by mouth daily.     Ergocalciferol 10 MCG (400 UNIT) TABS Take 1 tablet by mouth daily.     famotidine (PEPCID) 40 MG tablet Take 1 tablet (40 mg total) by mouth daily. 90 tablet 3   fexofenadine (ALLEGRA ALLERGY) 180 MG tablet Take 1 tablet (180 mg total) by mouth daily.     fluticasone (FLONASE) 50 MCG/ACT nasal spray Place 2 sprays into both nostrils daily. 48 g 3   levocetirizine (XYZAL) 5 MG tablet Take 1 tablet (5 mg total) by mouth every evening. 30 tablet 5   MIMVEY 1-0.5 MG tablet Take 1 tablet by mouth daily.     pantoprazole (PROTONIX) 40 MG  tablet Take 1 tablet (40 mg total) by mouth daily. 90 tablet 2   Semaglutide-Weight Management (WEGOVY) 0.5 MG/0.5ML SOAJ Inject 0.5 mg into the skin once a week. 2 mL 0   valACYclovir (VALTREX) 500 MG tablet Take 500 mg by mouth daily.     zinc gluconate 50 MG tablet Take 50 mg by mouth daily.     montelukast (SINGULAIR) 10 MG tablet TAKE 1 TABLET BY MOUTH EVERYDAY AT BEDTIME 90 tablet 1   No facility-administered medications prior to visit.    Allergies  Allergen Reactions   Vicodin [Hydrocodone-Acetaminophen] Nausea And Vomiting    Review of Systems  HENT:  Positive for sinus pain and sore throat.        Objective:    Physical Exam Constitutional:      General: She is not in acute distress.    Appearance: Normal appearance.  HENT:     Head: Normocephalic and atraumatic.     Right Ear: Tympanic membrane, ear canal and external ear normal.     Left Ear: Tympanic membrane, ear canal and external ear normal.     Mouth/Throat:     Mouth: Mucous membranes are moist.     Pharynx: Oropharynx is clear.  Eyes:     Extraocular Movements: Extraocular movements intact.     Pupils: Pupils are equal, round, and reactive to light.  Cardiovascular:     Rate and Rhythm: Normal rate and regular rhythm.     Heart sounds: Normal heart sounds. No murmur heard.    No gallop.  Pulmonary:     Effort: Pulmonary effort is normal. No respiratory distress.     Breath sounds: Normal breath sounds. No wheezing or rales.  Skin:    General: Skin is warm.  Neurological:     Mental Status: She is alert and oriented to person, place, and time.  Psychiatric:        Judgment: Judgment normal.     BP 133/79 (BP Location: Right Arm, Patient Position: Sitting, Cuff Size: Large)   Pulse 94   Temp 99.8 F (37.7 C) (Oral)   Resp 16   Wt 197 lb (89.4 kg)   SpO2 99%   BMI 33.81 kg/m  Wt Readings from Last 3 Encounters:  05/30/22 197 lb (89.4 kg)  05/24/22 195  lb (88.5 kg)  03/26/22 197 lb 3.2 oz  (89.4 kg)       Assessment & Plan:  Seasonal allergies Assessment & Plan: Uncontrolled despite rx with optivar, astepro, flonase, xyzal.  Will give trial of singulair. She may have superimposed URI as well. Rapid strep is negative. We discussed supportive measures and to reach out to [place] if symptoms worsen or if symptoms fail to improve.   Orders: -     Montelukast Sodium; TAKE 1 TABLET BY MOUTH EVERYDAY AT BEDTIME  Dispense: 90 tablet; Refill: 1    I, Lemont Fillers, NP, personally preformed the services described in this documentation.  All medical record entries made by the scribe were at my direction and in my presence.  I have reviewed the chart and discharge instructions (if applicable) and agree that the record reflects my personal performance and is accurate and complete. 05/30/2022  Lemont Fillers, NP  [facility] as a scribe for Lemont Fillers, NP.,have documented all relevant documentation on the behalf of Lemont Fillers, NP,as directed by  Lemont Fillers, NP while in the presence of Lemont Fillers, NP.

## 2022-05-30 NOTE — Assessment & Plan Note (Addendum)
Uncontrolled despite rx with optivar, astepro, flonase, xyzal.  Will give trial of singulair. She may have superimposed URI as well. Rapid strep is negative. We discussed supportive measures and to reach out to [place] if symptoms worsen or if symptoms fail to improve.

## 2022-06-21 ENCOUNTER — Ambulatory Visit (AMBULATORY_SURGERY_CENTER): Payer: Managed Care, Other (non HMO) | Admitting: Gastroenterology

## 2022-06-21 ENCOUNTER — Encounter: Payer: Self-pay | Admitting: Gastroenterology

## 2022-06-21 VITALS — BP 124/74 | HR 89 | Temp 98.6°F | Resp 13 | Ht 63.0 in | Wt 195.0 lb

## 2022-06-21 DIAGNOSIS — Z860100 Personal history of colon polyps, unspecified: Secondary | ICD-10-CM

## 2022-06-21 DIAGNOSIS — D122 Benign neoplasm of ascending colon: Secondary | ICD-10-CM | POA: Diagnosis not present

## 2022-06-21 DIAGNOSIS — D125 Benign neoplasm of sigmoid colon: Secondary | ICD-10-CM | POA: Diagnosis not present

## 2022-06-21 DIAGNOSIS — Z800 Family history of malignant neoplasm of digestive organs: Secondary | ICD-10-CM

## 2022-06-21 DIAGNOSIS — Z09 Encounter for follow-up examination after completed treatment for conditions other than malignant neoplasm: Secondary | ICD-10-CM

## 2022-06-21 MED ORDER — SODIUM CHLORIDE 0.9 % IV SOLN
500.0000 mL | Freq: Once | INTRAVENOUS | Status: DC
Start: 2022-06-21 — End: 2022-06-21

## 2022-06-21 NOTE — Patient Instructions (Signed)
Resume all of your previous medications.  YOU HAD AN ENDOSCOPIC PROCEDURE TODAY AT THE [facility]:   Refer to the procedure report that was given to you for any specific questions about what was found during the examination.  If the procedure report does not answer your questions, please call your gastroenterologist to clarify.  If you requested that your care partner not be given the details of your procedure findings, then the procedure report has been included in a sealed envelope for you to review at your convenience later.  YOU SHOULD EXPECT: Some feelings of bloating in the abdomen. Passage of more gas than usual.  Walking can help get rid of the air that was put into your GI tract during the procedure and reduce the bloating. If you had a lower endoscopy (such as a colonoscopy or flexible sigmoidoscopy) you may notice spotting of blood in your stool or on the toilet paper. If you underwent a bowel prep for your procedure, you may not have a normal bowel movement for a few days.  Please Note:  You might notice some irritation and congestion in your nose or some drainage.  This is from the oxygen used during your procedure.  There is no need for concern and it should clear up in a day or so.  SYMPTOMS TO REPORT IMMEDIATELY:  Following lower endoscopy (colonoscopy or flexible sigmoidoscopy):  Excessive amounts of blood in the stool  Significant tenderness or worsening of abdominal pains  Swelling of the abdomen that is new, acute  Fever of 100F or higher   For urgent or emergent issues, a gastroenterologist can be reached at any hour by calling [phone]. Do not use MyChart messaging for urgent concerns.    DIET:  We do recommend a small meal at first, but then you may proceed to your regular diet.  Drink plenty of fluids but you should avoid alcoholic beverages for 24 hours.  ACTIVITY:  You should plan to take it easy for the rest of today and you should NOT DRIVE or  use heavy machinery until tomorrow (because of the sedation medicines used during the test).    FOLLOW UP: Our staff will call the number listed on your records the next business day following your procedure.  We will call around 7:15- 8:00 am to check on you and address any questions or concerns that you may have regarding the information given to you following your procedure. If we do not reach you, we will leave a message.     If any biopsies were taken you will be contacted by phone or by letter within the next 1-3 weeks.  Please call us at [phone] if you have not heard about the biopsies in 3 weeks.    SIGNATURES/CONFIDENTIALITY: You and/or your care partner have signed paperwork which will be entered into your electronic medical record.  These signatures attest to the fact that that the information above on your After Visit Summary has been reviewed and is understood.  Full responsibility of the confidentiality of this discharge information lies with you and/or your care-partner. 

## 2022-06-21 NOTE — Progress Notes (Signed)
Pt's states no medical or surgical changes since previsit or office visit. 

## 2022-06-21 NOTE — Progress Notes (Signed)
[facility] History and Physical   Primary Care Physician:  Zola Button, Grayling Congress, DO   Reason for Procedure:   History of colon polyps  Plan:    colonoscopy     HPI: Vicki Le is a 57 y.o. female  here for colonoscopy surveillance - last exam 10/2018 - 8 polyps removed. (+) Family history of colon cancer.    Patient denies any bowel symptoms at this time.  Otherwise feels well without any cardiopulmonary symptoms.   I have discussed risks / benefits of anesthesia and endoscopic procedure with Wendall Papa and they wish to proceed with the exams as outlined today.    Past Medical History:  Diagnosis Date   Anxiety    Chicken pox    Depression    GERD (gastroesophageal reflux disease)    Melanoma (HCC)    left buttock   Sleep apnea    Urticaria     Past Surgical History:  Procedure Laterality Date   MELANOMA EXCISION  2013   skin cancer removal  August 14, 2011   2 lymph nodes removed as well   TONSILECTOMY/ADENOIDECTOMY WITH MYRINGOTOMY     WRIST SURGERY  1991/1192   ganglion cyst r wrist    Prior to Admission medications   Medication Sig Start Date End Date Taking? Authorizing Provider  Ascorbic Acid (VITAMIN C) 1000 MG tablet Take 1,000 mg by mouth daily.   Yes [provider]  Azelastine HCl (ASTEPRO) 0.15 % SOLN Place 1 spray into the nose 2 (two) times daily. 06/09/21  Yes Coralyn Helling, MD  buPROPion (WELLBUTRIN XL) 150 MG 24 hr tablet TAKE 3 TABLETS BY MOUTH DAILY. 05/11/22  Yes Seabron Spates R, DO  cholecalciferol (VITAMIN D) 1000 UNITS tablet Take 1,000 Units by mouth daily.   Yes [provider]  Ergocalciferol 10 MCG (400 UNIT) TABS Take 1 tablet by mouth daily. 08/10/11  Yes [provider]  fexofenadine (ALLEGRA ALLERGY) 180 MG tablet Take 1 tablet (180 mg total) by mouth daily. 10/29/16  Yes Seabron Spates R, DO  fluticasone (FLONASE) 50 MCG/ACT nasal spray Place 2 sprays into both nostrils daily. 12/15/20   Yes Donato Schultz, DO  levocetirizine (XYZAL) 5 MG tablet Take 1 tablet (5 mg total) by mouth every evening. 10/29/16  Yes Lowne Chase, Yvonne R, DO  MIMVEY 1-0.5 MG tablet Take 1 tablet by mouth daily. 01/03/15  Yes [provider]  montelukast (SINGULAIR) 10 MG tablet TAKE 1 TABLET BY MOUTH EVERYDAY AT BEDTIME 05/30/22  Yes Sandford Craze, NP  pantoprazole (PROTONIX) 40 MG tablet Take 1 tablet (40 mg total) by mouth daily. 12/11/21  Yes Donato Schultz, DO  valACYclovir (VALTREX) 500 MG tablet Take 500 mg by mouth daily.   Yes [provider]  zinc gluconate 50 MG tablet Take 50 mg by mouth daily.   Yes [provider]  albuterol (VENTOLIN HFA) 108 (90 Base) MCG/ACT inhaler TAKE 2 PUFFS BY MOUTH EVERY 6 HOURS AS NEEDED FOR WHEEZE OR SHORTNESS OF BREATH 03/27/21   Clayborne Dana, NP  azelastine (OPTIVAR) 0.05 % ophthalmic solution Place 1 drop into both eyes 2 (two) times daily. 06/16/21   Donato Schultz, DO  famotidine (PEPCID) 40 MG tablet Take 1 tablet (40 mg total) by mouth daily. 12/15/20   Donato Schultz, DO  Semaglutide-Weight Management (WEGOVY) 0.5 MG/0.5ML SOAJ Inject 0.5 mg into the skin once a week. Patient not taking: Reported  on 06/21/2022 03/26/22   Donato Schultz, DO    Current Outpatient Medications  Medication Sig Dispense Refill   Ascorbic Acid (VITAMIN C) 1000 MG tablet Take 1,000 mg by mouth daily.     Azelastine HCl (ASTEPRO) 0.15 % SOLN Place 1 spray into the nose 2 (two) times daily.     buPROPion (WELLBUTRIN XL) 150 MG 24 hr tablet TAKE 3 TABLETS BY MOUTH DAILY. 270 tablet 1   cholecalciferol (VITAMIN D) 1000 UNITS tablet Take 1,000 Units by mouth daily.     Ergocalciferol 10 MCG (400 UNIT) TABS Take 1 tablet by mouth daily.     fexofenadine (ALLEGRA ALLERGY) 180 MG tablet Take 1 tablet (180 mg total) by mouth daily.     fluticasone (FLONASE) 50 MCG/ACT nasal spray Place 2 sprays into both nostrils daily. 48 g  3   levocetirizine (XYZAL) 5 MG tablet Take 1 tablet (5 mg total) by mouth every evening. 30 tablet 5   MIMVEY 1-0.5 MG tablet Take 1 tablet by mouth daily.     montelukast (SINGULAIR) 10 MG tablet TAKE 1 TABLET BY MOUTH EVERYDAY AT BEDTIME 90 tablet 1   pantoprazole (PROTONIX) 40 MG tablet Take 1 tablet (40 mg total) by mouth daily. 90 tablet 2   valACYclovir (VALTREX) 500 MG tablet Take 500 mg by mouth daily.     zinc gluconate 50 MG tablet Take 50 mg by mouth daily.     albuterol (VENTOLIN HFA) 108 (90 Base) MCG/ACT inhaler TAKE 2 PUFFS BY MOUTH EVERY 6 HOURS AS NEEDED FOR WHEEZE OR SHORTNESS OF BREATH 8.5 each 2   azelastine (OPTIVAR) 0.05 % ophthalmic solution Place 1 drop into both eyes 2 (two) times daily. 6 mL 12   famotidine (PEPCID) 40 MG tablet Take 1 tablet (40 mg total) by mouth daily. 90 tablet 3   Semaglutide-Weight Management (WEGOVY) 0.5 MG/0.5ML SOAJ Inject 0.5 mg into the skin once a week. (Patient not taking: Reported on 06/21/2022) 2 mL 0   Current Facility-Administered Medications  Medication Dose Route Frequency Provider Last Rate Last Admin   0.9 %  sodium chloride infusion  500 mL Intravenous Once Evertt Chouinard, Willaim Rayas, MD        Allergies as of 06/21/2022 - Review Complete 06/21/2022  Allergen Reaction Noted   Vicodin [hydrocodone-acetaminophen] Nausea And Vomiting 06/01/2013    Family History  Problem Relation Age of Onset   Allergic rhinitis Mother    Depression Mother    Kidney Stones Mother    Allergic rhinitis Father    Alzheimer's disease Father    Colon polyps Father        suspicious polyps and 8 in of colon removed ? colon cancer    Depression Father    Hypertension Father    Allergic rhinitis Sister    Allergic rhinitis Sister    Allergic rhinitis Sister    Breast cancer Paternal Aunt    Breast cancer Maternal Grandmother    Colon cancer Neg Hx    Esophageal cancer Neg Hx    Rectal cancer Neg Hx    Stomach cancer Neg Hx     Social History    Socioeconomic History   Marital status: Married    Spouse name: Not on file   Number of children: 0   Years of education: Not on file   Highest education level: Not on file  Occupational History   Occupation: claims processor  Tobacco Use   Smoking status: Never  Passive exposure: Never   Smokeless tobacco: Never  Vaping Use   Vaping Use: Never used  Substance and Sexual Activity   Alcohol use: Yes    Comment: occassionally   Drug use: No   Sexual activity: Yes    Partners: Male  Other Topics Concern   Not on file  Social History Narrative   No exercise   Social Determinants of Health   Financial Resource Strain: Not on file  Food Insecurity: Not on file  Transportation Needs: Not on file  Physical Activity: Not on file  Stress: Not on file  Social Connections: Not on file  Intimate Partner Violence: Not on file    Review of Systems: All other review of systems negative except as mentioned in the HPI.  Physical Exam: Vital signs BP (!) 147/94   Pulse 86   Temp 98.6 F (37 C)   Ht 5\' 3"  (1.6 m)   Wt 195 lb (88.5 kg)   LMP 02/09/2016   SpO2 97%   BMI 34.54 kg/m   General:   Alert,  Well-developed, pleasant and cooperative in NAD Lungs:  Clear throughout to auscultation.   Heart:  Regular rate and rhythm Abdomen:  Soft, nontender and nondistended.   Neuro/Psych:  Alert and cooperative. Normal mood and affect. A and O x 3  Harlin Rain, MD [facility]

## 2022-06-21 NOTE — Op Note (Signed)
[facility] Patient Name: Vicki Le Procedure Date: 06/21/2022 11:32 AM MRN: [id number] Endoscopist: Viviann Spare P. Adela Lank , MD, [id number] Age: 57 Referring MD:  Date of Birth: 1965-11-13 Gender: Female Account #: [id number] Procedure:                Colonoscopy Indications:              High risk colon cancer surveillance: Personal                            history of colonic polyps - last exam 10/2018 - 8                            polyps removed, also with (+) family history of                            colon cancer Medicines:                Monitored Anesthesia Care Procedure:                Pre-Anesthesia Assessment:                           - Prior to the procedure, a History and Physical                            was performed, and patient medications and                            allergies were reviewed. The patient's tolerance of                            previous anesthesia was also reviewed. The risks                            and benefits of the procedure and the sedation                            options and risks were discussed with the patient.                            All questions were answered, and informed consent                            was obtained. Prior Anticoagulants: The patient has                            taken no anticoagulant or antiplatelet agents. ASA                            Grade Assessment: III - A patient with severe                            systemic disease. After reviewing the risks and  benefits, the patient was deemed in satisfactory                            condition to undergo the procedure.                           After obtaining informed consent, the colonoscope                            was passed under direct vision. Throughout the                            procedure, the patient's blood pressure, pulse, and                            oxygen saturations were monitored  continuously. The                            Olympus [id number]-H190DL [id number]) Colonoscope was                            introduced through the anus and advanced to the the                            cecum, identified by appendiceal orifice and                            ileocecal valve. The colonoscopy was performed                            without difficulty. The patient tolerated the                            procedure well. The quality of the bowel                            preparation was adequate. The ileocecal valve,                            appendiceal orifice, and rectum were photographed. Scope In: 11:40:34 AM Scope Out: 11:58:52 AM Scope Withdrawal Time: 0 hours 15 minutes 24 seconds  Total Procedure Duration: 0 hours 18 minutes 18 seconds  Findings:                 Skin tags were found on perianal exam.                           Two sessile polyps were found in the ascending                            colon. The polyps were 3 mm in size. These polyps                            were removed with a cold snare. Resection and  retrieval were complete.                           A 3 mm polyp was found in the sigmoid colon. The                            polyp was sessile. The polyp was removed with a                            cold snare. Resection and retrieval were complete.                           Multiple small-mouthed diverticula were found in                            the sigmoid colon.                           Internal hemorrhoids were found during retroflexion.                           The exam was otherwise without abnormality. Complications:            No immediate complications. Estimated blood loss:                            Minimal. Estimated Blood Loss:     Estimated blood loss was minimal. Impression:               - Perianal skin tags found on perianal exam.                           - Two 3 mm polyps in the ascending colon,  removed                            with a cold snare. Resected and retrieved.                           - One 3 mm polyp in the sigmoid colon, removed with                            a cold snare. Resected and retrieved.                           - Diverticulosis in the sigmoid colon.                           - Internal hemorrhoids.                           - The examination was otherwise normal.                           - The GI Genius (intelligent endoscopy module),  computer-aided polyp detection system powered by AI                            was utilized to detect colorectal polyps through                            enhanced visualization during colonoscopy. Recommendation:           - Patient has a contact number available for                            emergencies. The signs and symptoms of potential                            delayed complications were discussed with the                            patient. Return to normal activities tomorrow.                            Written discharge instructions were provided to the                            patient.                           - Resume previous diet.                           - Continue present medications.                           - Await pathology results. Viviann Spare P. Yanet Balliet, MD 06/21/2022 12:03:37 PM This report has been signed electronically.

## 2022-06-21 NOTE — Progress Notes (Signed)
Called to room to assist during endoscopic procedure.  Patient ID and intended procedure confirmed with present staff. Received instructions for my participation in the procedure from the performing physician.  

## 2022-06-21 NOTE — Progress Notes (Signed)
Vss nad trans to pacu 

## 2022-06-22 ENCOUNTER — Telehealth: Payer: Self-pay | Admitting: *Deleted

## 2022-06-22 NOTE — Telephone Encounter (Signed)
  Follow up Call-     06/21/2022   10:41 AM  Call back number  Post procedure Call Back phone  # [phone]  Permission to leave phone message Yes     Patient questions:  Do you have a fever, pain , or abdominal swelling? No. Pain Score  0 *  Have you tolerated food without any problems? Yes.    Have you been able to return to your normal activities? Yes.    Do you have any questions about your discharge instructions: Diet   No. Medications  No. Follow up visit  No.  Do you have questions or concerns about your Care? No.  Actions: * If pain score is 4 or above: No action needed, pain <4.

## 2022-07-09 ENCOUNTER — Other Ambulatory Visit: Payer: Self-pay | Admitting: Family Medicine

## 2022-07-09 ENCOUNTER — Encounter: Payer: Self-pay | Admitting: Family Medicine

## 2022-07-09 DIAGNOSIS — F32A Depression, unspecified: Secondary | ICD-10-CM

## 2022-07-09 MED ORDER — BUPROPION HCL ER (XL) 150 MG PO TB24
450.0000 mg | ORAL_TABLET | Freq: Every day | ORAL | 1 refills | Status: DC
Start: 2022-07-09 — End: 2023-07-08

## 2022-07-10 ENCOUNTER — Telehealth: Payer: Self-pay | Admitting: *Deleted

## 2022-07-10 NOTE — Telephone Encounter (Signed)
Prior auth started via cover my meds.  Awaiting determination.  Key: [id number]

## 2022-07-10 NOTE — Telephone Encounter (Signed)
Can u complete PA

## 2022-07-11 NOTE — Telephone Encounter (Signed)
See phone message.  Awaiting determination.

## 2022-07-12 NOTE — Telephone Encounter (Signed)
Your request has been approved CaseId:[id number];Status:Approved;Review Type:Qty;Coverage Start Date:06/11/2022;Coverage End Date:07/11/2023;

## 2022-07-15 IMAGING — MG MM DIGITAL DIAGNOSTIC UNILAT*R*
5 series · 5 of 5 positions shown · non-contrast
Comparison: Previous exams including recent screening mammogram
dated 06/21/2021.

CLINICAL DATA: Patient returns today to evaluate RIGHT breast
calcifications identified on a recent screening mammogram.

EXAM:
DIGITAL DIAGNOSTIC UNILATERAL RIGHT MAMMOGRAM
TECHNIQUE: Right digital diagnostic mammography was performed. Mammographic
images were processed with CAD.

[R ML (1 of 3)]
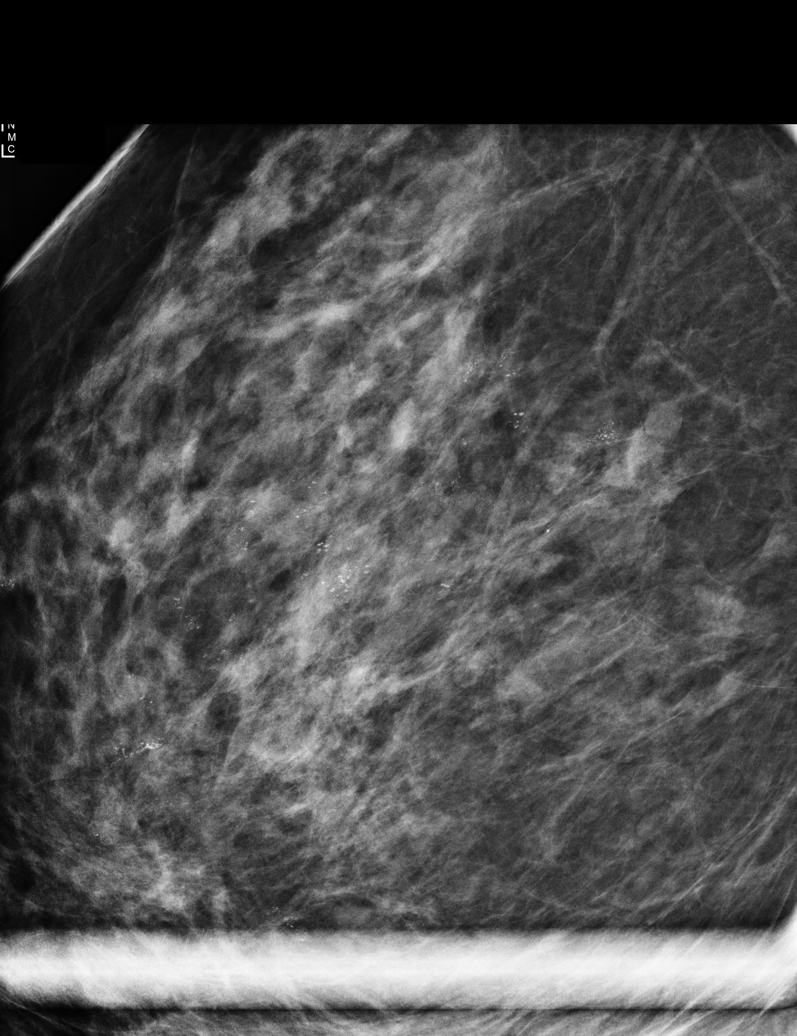

[R CC (1 of 2)]
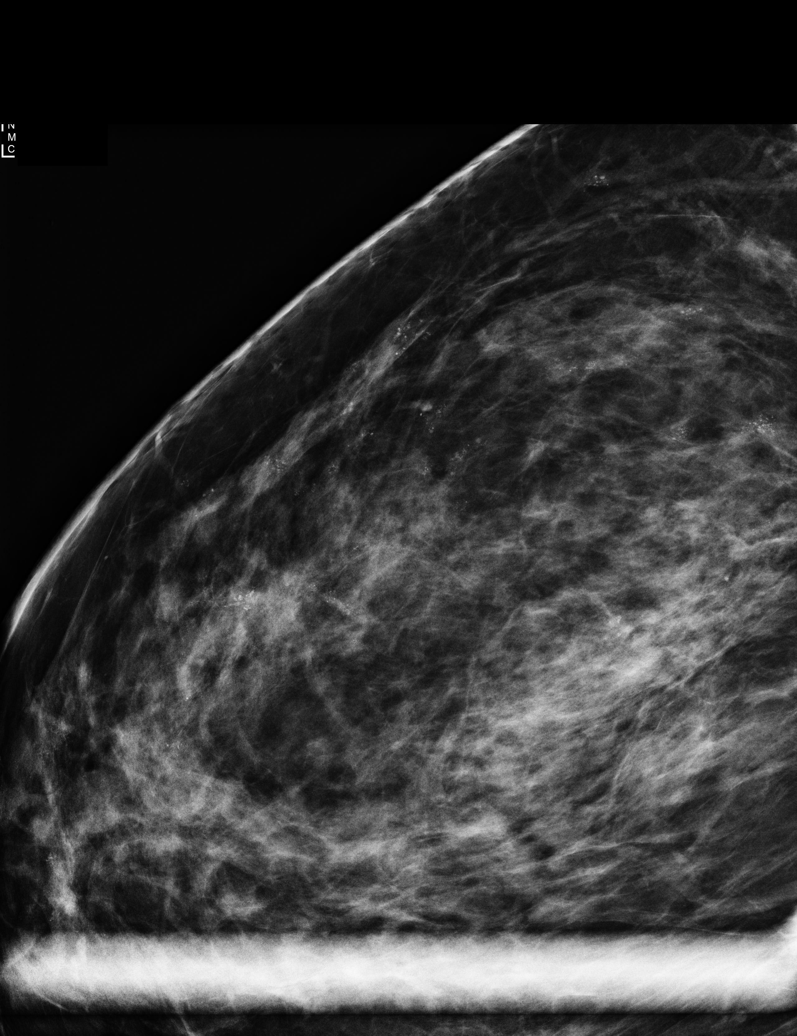

[R ML (2 of 3)]
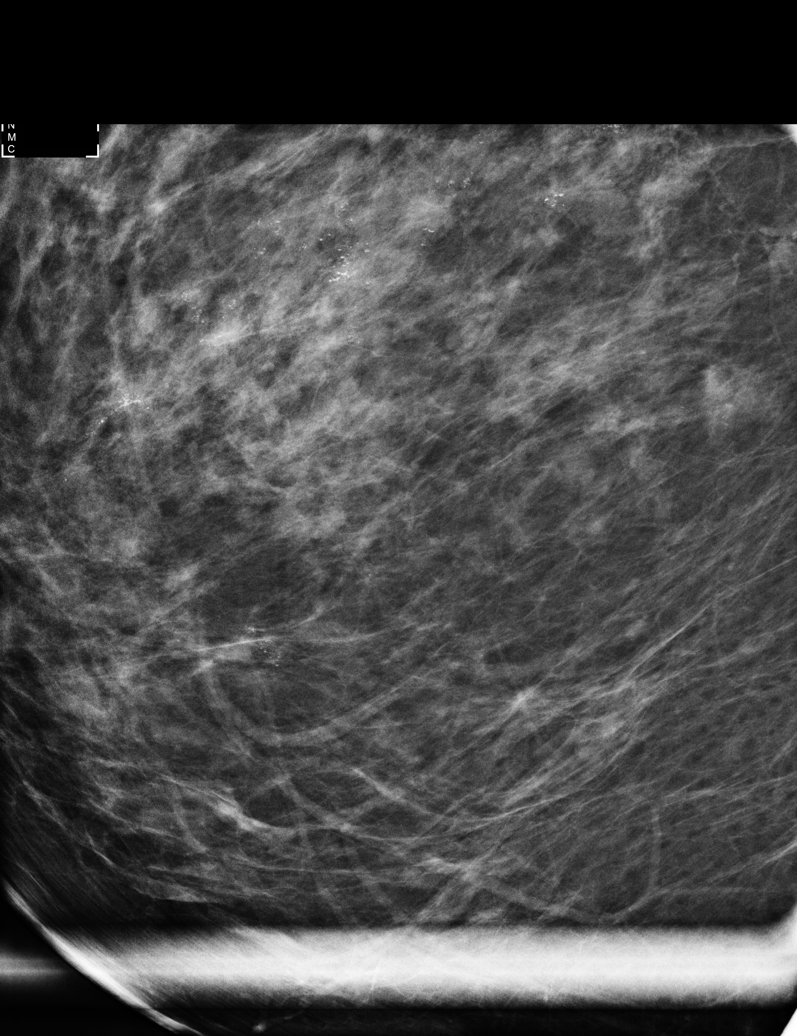

[R CC (2 of 2)]
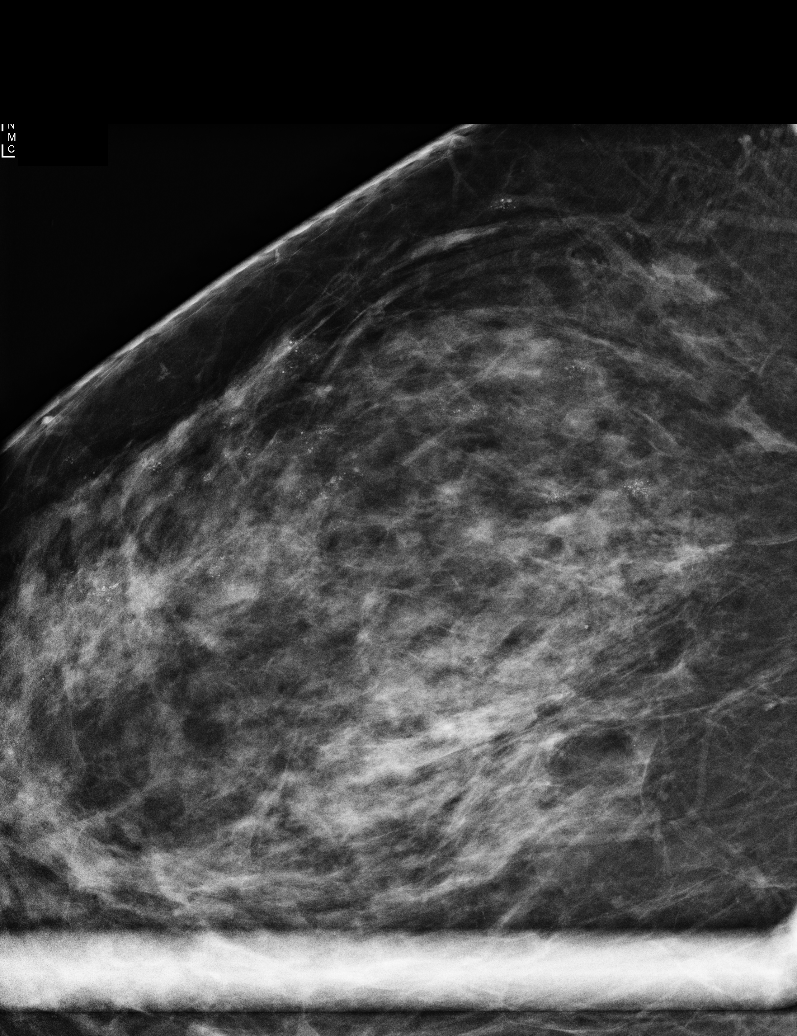

[R ML (3 of 3)]
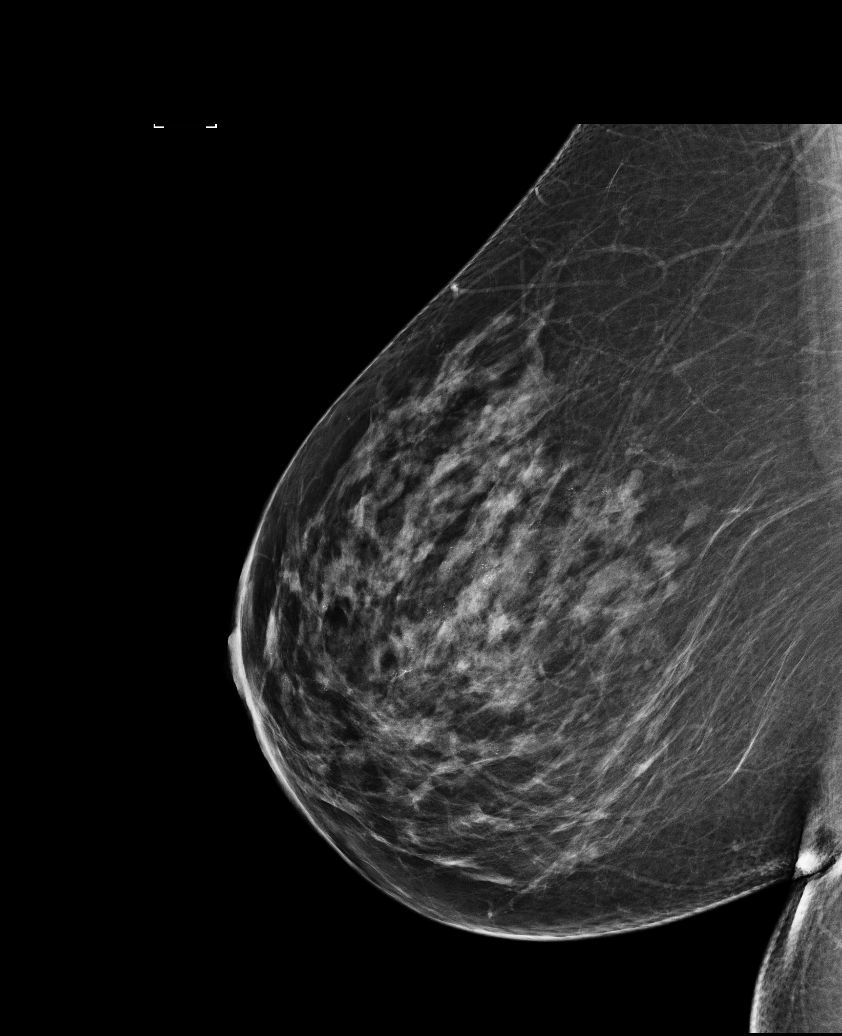

[5 of 5 positions shown; findings below may reference images not displayed]

ACR Breast Density Category c: The breast tissue is heterogeneously
dense, which may obscure small masses.
FINDINGS: On today's additional diagnostic views, including magnification
views, the scattered calcifications within the RIGHT breast have a
layering morphology indicating benign milk of calcium/fibrocystic
change. There are no suspicious pleomorphic or fine linear branching
calcifications.
IMPRESSION: No evidence of malignancy. Benign calcifications within the LEFT
breast.

Patient may return to routine annual bilateral screening mammogram
schedule.

RECOMMENDATION:
Screening mammogram in one year.(Code:[id number])

I have discussed the findings and recommendations with the patient.
If applicable, a reminder letter will be sent to the patient
regarding the next appointment.

BI-RADS CATEGORY  2: Benign.

## 2022-08-02 LAB — HM PAP SMEAR: HPV, high-risk: NEGATIVE

## 2022-08-02 LAB — HM MAMMOGRAPHY

## 2022-08-04 ENCOUNTER — Encounter: Payer: Self-pay | Admitting: Family Medicine

## 2022-08-04 DIAGNOSIS — J302 Other seasonal allergic rhinitis: Secondary | ICD-10-CM

## 2022-08-06 MED ORDER — AZELASTINE HCL 0.05 % OP SOLN
1.0000 [drp] | Freq: Two times a day (BID) | OPHTHALMIC | 3 refills | Status: AC
Start: 2022-08-06 — End: ?

## 2022-09-20 ENCOUNTER — Other Ambulatory Visit: Payer: Self-pay | Admitting: Family Medicine

## 2022-09-20 DIAGNOSIS — K219 Gastro-esophageal reflux disease without esophagitis: Secondary | ICD-10-CM

## 2022-12-09 ENCOUNTER — Other Ambulatory Visit: Payer: Self-pay | Admitting: Family

## 2022-12-09 DIAGNOSIS — J302 Other seasonal allergic rhinitis: Secondary | ICD-10-CM

## 2023-01-23 ENCOUNTER — Ambulatory Visit: Payer: Self-pay | Admitting: Family Medicine

## 2023-01-23 ENCOUNTER — Ambulatory Visit: Payer: Managed Care, Other (non HMO) | Admitting: Medical

## 2023-01-23 ENCOUNTER — Encounter: Payer: Self-pay | Admitting: Medical

## 2023-01-23 VITALS — BP 120/84 | HR 96 | Temp 98.8°F | Resp 18 | Ht 63.0 in | Wt 208.0 lb

## 2023-01-23 DIAGNOSIS — M791 Myalgia, unspecified site: Secondary | ICD-10-CM

## 2023-01-23 DIAGNOSIS — R112 Nausea with vomiting, unspecified: Secondary | ICD-10-CM | POA: Diagnosis not present

## 2023-01-23 DIAGNOSIS — R140 Abdominal distension (gaseous): Secondary | ICD-10-CM | POA: Diagnosis not present

## 2023-01-23 DIAGNOSIS — R195 Other fecal abnormalities: Secondary | ICD-10-CM

## 2023-01-23 DIAGNOSIS — R197 Diarrhea, unspecified: Secondary | ICD-10-CM

## 2023-01-23 LAB — CBC WITH DIFFERENTIAL/PLATELET
Basophils Absolute: 0 10*3/uL (ref 0.0–0.1)
Basophils Relative: 0.3 % (ref 0.0–3.0)
Eosinophils Absolute: 0.1 10*3/uL (ref 0.0–0.7)
Eosinophils Relative: 1.4 % (ref 0.0–5.0)
HCT: 42.5 % (ref 36.0–46.0)
Hemoglobin: 14.4 g/dL (ref 12.0–15.0)
Lymphocytes Relative: 13 % (ref 12.0–46.0)
Lymphs Abs: 0.8 10*3/uL (ref 0.7–4.0)
MCHC: 33.9 g/dL (ref 30.0–36.0)
MCV: 97.7 fL (ref 78.0–100.0)
Monocytes Absolute: 0.5 10*3/uL (ref 0.1–1.0)
Monocytes Relative: 8.3 % (ref 3.0–12.0)
Neutro Abs: 4.8 10*3/uL (ref 1.4–7.7)
Neutrophils Relative %: 77 % (ref 43.0–77.0)
Platelets: 279 10*3/uL (ref 150.0–400.0)
RBC: 4.35 Mil/uL (ref 3.87–5.11)
RDW: 12.8 % (ref 11.5–15.5)
WBC: 6.2 10*3/uL (ref 4.0–10.5)

## 2023-01-23 LAB — COMPREHENSIVE METABOLIC PANEL
ALT: 9 U/L (ref 0–35)
AST: 12 U/L (ref 0–37)
Albumin: 4.1 g/dL (ref 3.5–5.2)
Alkaline Phosphatase: 50 U/L (ref 39–117)
BUN: 7 mg/dL (ref 6–23)
CO2: 27 meq/L (ref 19–32)
Calcium: 8.6 mg/dL (ref 8.4–10.5)
Chloride: 102 meq/L (ref 96–112)
Creatinine, Ser: 0.6 mg/dL (ref 0.40–1.20)
GFR: 99.11 mL/min (ref >60.00)
Glucose, Bld: 98 mg/dL (ref 70–99)
Potassium: 3.7 meq/L (ref 3.5–5.1)
Sodium: 139 meq/L (ref 135–145)
Total Bilirubin: 0.7 mg/dL (ref 0.2–1.2)
Total Protein: 6.4 g/dL (ref 6.0–8.3)

## 2023-01-23 LAB — LIPASE: Lipase: 14 U/L (ref 11.0–59.0)

## 2023-01-23 MED ORDER — FAMOTIDINE 20 MG PO TABS: 20.0000 mg | ORAL_TABLET | Freq: Every day | ORAL | 0 refills | Status: DC

## 2023-01-23 MED ORDER — ONDANSETRON HCL 4 MG PO TABS: 4.0000 mg | ORAL_TABLET | Freq: Three times a day (TID) | ORAL | 0 refills | Status: DC | PRN

## 2023-01-23 NOTE — Patient Instructions (Signed)
Acute Gastroenteritis Sudden onset of diarrhea, nausea, vomiting, and bloating. No recent antibiotic use. No known sick contacts. History of H. pylori infection several years ago. -Order blood work including lipase, metabolic panel, and CBC. -Provide stool kit for stool culture, ovum and parasite test, and C. diff test. -Prescribe Zofran for nausea and vomiting. -Advise against Imodium use until stool sample is collected. -Recommend bland diet and avoidance of fruit juices. -Continue Protonix and add famotidine. -Consider antibiotics if stool frequency significantly increases.  Gallstones History of multiple tiny non-mobile gallstones. Epigastric and bilateral upper quadrant discomfort, but no specific right upper quadrant pain. -Monitor for changes in pain, particularly in right upper quadrant.  Diverticulosis Known diverticulosis with no current left lower quadrant pain. -Monitor for changes in pain, particularly in left lower quadrant.  Follow-up Keep provider updated on symptoms, particularly changes in pain location or stool frequency. If symptoms worsen, treatment plan may need to be adjusted.

## 2023-01-23 NOTE — Telephone Encounter (Signed)
Appt scheduled w/ Edward.  ?

## 2023-01-23 NOTE — Progress Notes (Signed)
Subjective:    Patient ID: Vicki Le, female    DOB: 1965/12/03, 57 y.o.   MRN: [id number]  HPI  Discussed the use of AI scribe software for clinical note transcription with the patient, who gave verbal consent to proceed.  History of Present Illness   Pt has history of H. Pylori infection, gallstones, and diverticulosis, presents with acute onset of gastrointestinal symptoms. She reports having not eaten since the previous morning due to feeling bloated and experiencing diarrhea. The patient describes the diarrhea as being like 'straight brown water.' The frequency of these episodes is such that the patient has lost count, but estimates it to be around four or five times within the last 24 hours. No constipation preceding the diarrhea.  The patient also reports feeling cold and achy and mild myaglia symptoms, followed by feeling hot. She has vomited twice, with the vomitus being mostly water. The patient denies any recent antibiotic use and does not associate the symptoms with any particular food intake.  The patient describes a sensation of bloating and discomfort in the upper quadrants, both mid and upper, but denies any pain on pressing the area. She also reports a 'raw' feeling in the stomach, not like a burning sensation. The patient has been taking pantoprazole (Protonix) and occasionally famotidine.  The patient also reports frequent burping and a history of feeling bloated and uncomfortable after eating, to the point of needing to loosen her clothing. However, she denies any pattern of pain after eating in the last two days. The patient denies any preceding constipation before the onset of the current symptoms.       Review of Systems  Constitutional:  Negative for chills, fatigue and fever.  HENT:  Negative for congestion, ear discharge and ear pain.   Respiratory:  Negative for cough, chest tightness and wheezing.   Cardiovascular:  Negative for chest pain and palpitations.   Gastrointestinal:  Positive for abdominal pain, diarrhea, nausea and vomiting. Negative for abdominal distention and blood in stool.  Genitourinary:  Negative for dysuria and frequency.  Musculoskeletal:  Negative for back pain and neck pain.  Skin:  Negative for wound.  Neurological:  Negative for dizziness, speech difficulty, weakness and light-headedness.  Hematological:  Negative for adenopathy. Does not bruise/bleed easily.  Psychiatric/Behavioral:  Negative for behavioral problems, decreased concentration and dysphoric mood.     Past Medical History:  Diagnosis Date   Anxiety    Chicken pox    Depression    GERD (gastroesophageal reflux disease)    Melanoma (HCC)    left buttock   Sleep apnea    Urticaria      Social History   Socioeconomic History   Marital status: Married    Spouse name: Not on file   Number of children: 0   Years of education: Not on file   Highest education level: Not on file  Occupational History   Occupation: claims processor  Tobacco Use   Smoking status: Never    Passive exposure: Never   Smokeless tobacco: Never  Vaping Use   Vaping status: Never Used  Substance and Sexual Activity   Alcohol use: Yes    Comment: occassionally   Drug use: No   Sexual activity: Yes    Partners: Male  Other Topics Concern   Not on file  Social History Narrative   No exercise   Social Drivers of Corporate investment banker Strain: Not on file  Food Insecurity: Not on file  Transportation Needs: Not on file  Physical Activity: Not on file  Stress: Not on file  Social Connections: Not on file  Intimate Partner Violence: Not on file    Past Surgical History:  Procedure Laterality Date   MELANOMA EXCISION  2013   skin cancer removal  August 14, 2011   2 lymph nodes removed as well   TONSILECTOMY/ADENOIDECTOMY WITH MYRINGOTOMY     WRIST SURGERY  1991/1192   ganglion cyst r wrist    Family History  Problem Relation Age of Onset   Allergic  rhinitis Mother    Depression Mother    Kidney Stones Mother    Allergic rhinitis Father    Alzheimer's disease Father    Colon polyps Father        suspicious polyps and 8 in of colon removed ? colon cancer    Depression Father    Hypertension Father    Allergic rhinitis Sister    Allergic rhinitis Sister    Allergic rhinitis Sister    Breast cancer Paternal Aunt    Breast cancer Maternal Grandmother    Colon cancer Neg Hx    Esophageal cancer Neg Hx    Rectal cancer Neg Hx    Stomach cancer Neg Hx     Allergies  Allergen Reactions   Vicodin [Hydrocodone-Acetaminophen] Nausea And Vomiting    Current Outpatient Medications on File Prior to Visit  Medication Sig Dispense Refill   albuterol (VENTOLIN HFA) 108 (90 Base) MCG/ACT inhaler TAKE 2 PUFFS BY MOUTH EVERY 6 HOURS AS NEEDED FOR WHEEZE OR SHORTNESS OF BREATH 8.5 each 2   Ascorbic Acid (VITAMIN C) 1000 MG tablet Take 1,000 mg by mouth daily.     azelastine (OPTIVAR) 0.05 % ophthalmic solution Place 1 drop into both eyes 2 (two) times daily. 6 mL 3   Azelastine HCl (ASTEPRO) 0.15 % SOLN Place 1 spray into the nose 2 (two) times daily.     buPROPion (WELLBUTRIN XL) 150 MG 24 hr tablet Take 3 tablets (450 mg total) by mouth daily. 270 tablet 1   cholecalciferol (VITAMIN D) 1000 UNITS tablet Take 1,000 Units by mouth daily.     Ergocalciferol 10 MCG (400 UNIT) TABS Take 1 tablet by mouth daily.     famotidine (PEPCID) 40 MG tablet Take 1 tablet (40 mg total) by mouth daily. 90 tablet 3   fexofenadine (ALLEGRA ALLERGY) 180 MG tablet Take 1 tablet (180 mg total) by mouth daily.     fluticasone (FLONASE) 50 MCG/ACT nasal spray Place 2 sprays into both nostrils daily. 48 g 3   levocetirizine (XYZAL) 5 MG tablet Take 1 tablet (5 mg total) by mouth every evening. 30 tablet 5   MIMVEY 1-0.5 MG tablet Take 1 tablet by mouth daily.     montelukast (SINGULAIR) 10 MG tablet TAKE 1 TABLET BY MOUTH EVERYDAY AT BEDTIME 90 tablet 1    pantoprazole (PROTONIX) 40 MG tablet TAKE 1 TABLET (40 MG TOTAL) BY MOUTH DAILY. 90 tablet 2   Semaglutide-Weight Management (WEGOVY) 0.5 MG/0.5ML SOAJ Inject 0.5 mg into the skin once a week. (Patient not taking: Reported on 06/21/2022) 2 mL 0   valACYclovir (VALTREX) 500 MG tablet Take 500 mg by mouth daily.     zinc gluconate 50 MG tablet Take 50 mg by mouth daily.     No current facility-administered medications on file prior to visit.    BP 120/84   Pulse 96   Temp 98.8 F (37.1 C)  Resp 18   Ht 5\' 3"  (1.6 m)   Wt 208 lb (94.3 kg)   LMP 02/09/2016   SpO2 100%   BMI 36.85 kg/m        Objective:   Physical Exam  General Mental Status- Alert. General Appearance- Not in acute distress.   Skin General: Color- Normal Color. Moisture- Normal Moisture.  Neck Carotid Arteries- Normal color. Moisture- Normal Moisture. No carotid bruits. No JVD.  Chest and Lung Exam Auscultation: Breath Sounds:-Normal.  Cardiovascular Auscultation:Rythm- Regular. Murmurs & Other Heart Sounds:Auscultation of the heart reveals- No Murmurs.  Abdomen Inspection:-Inspeection Normal. Palpation/Percussion:Note:No mass. Palpation and Percussion of the abdomen reveal- mild rt upper quadrant Tender and faint bilateral lower quadrant, Non Distended + BS, no rebound or guarding.   Neurologic Cranial Nerve exam:- CN III-XII intact(No nystagmus), symmetric smile. Strength:- 5/5 equal and symmetric strength both upper and lower extremities.       Assessment & Plan:   Assessment and Plan    Acute Gastroenteritis Sudden onset of diarrhea, nausea, vomiting, and bloating. No recent antibiotic use. No known sick contacts. History of H. pylori infection several years ago. -Order blood work including lipase, metabolic panel, and CBC. -Provide stool kit for stool culture, ovum and parasite test, and C. diff test. -Prescribe Zofran for nausea and vomiting. -Advise against Imodium use until stool  sample is collected. -Recommend bland diet and avoidance of fruit juices. -Continue Protonix and add famotidine. -Consider antibiotics if stool frequency significantly increases.  Gallstones History of multiple tiny non-mobile gallstones. Epigastric and bilateral upper quadrant discomfort, but no specific right upper quadrant pain. -Monitor for changes in pain, particularly in right upper quadrant.  Diverticulosis Known diverticulosis with no current left lower quadrant pain. -Monitor for changes in pain, particularly in left lower quadrant.  Follow-up Keep provider updated on symptoms, particularly changes in pain location or stool frequency. If symptoms worsen, treatment plan may need to be adjusted.        Esperanza Richters, PA-C

## 2023-01-23 NOTE — Telephone Encounter (Signed)
Copied from CRM [id number]. Topic: Clinical - Pink Word Triage >> Jan 23, 2023  8:31 AM Vicki Le wrote: Reason for Triage: Stomach issues : diarrhea, bloated, vomiting Reason for Disposition  [1] MODERATE-SEVERE SWELLING of abdomen (e.g., looks very distended or swollen) AND [2] NEW-onset or much worse  Answer Assessment - Initial Assessment Questions 1. SYMPTOM: "What's the main symptom you're concerned about?" (e.g., abdomen bloating, swelling)     Abd bloating, n/v, diarrhea -watery, not sleeping well due to abd pain/pressure 2. ONSET: "When did bloating  start?"     yesterday 3. SEVERITY: "How bad is the bloating or swelling?"    - BLOATING: Feels gassy or bloated. No visible swelling.     - MILD SWELLING: Feels gassy or bloated. Abdomen looks mildly distended or swollen.    - MODERATE - SEVERE SWELLING: Abdomen looks very distended or swollen.      Looks bloated and feels tight 4. ABDOMEN PAIN:  "Is there any abdomen pain?" If Yes, ask: "How bad is the pain?"  (e.g., Scale 1-10; mild, moderate, or severe)   - NONE (0): No pain.   - MILD (1-3): Doesn't interfere with normal activities, abdomen soft and not tender to touch.    - MODERATE (4-7): Interferes with normal activities or awakens from sleep, abdomen tender to touch.    - SEVERE (8-10): Excruciating pain, doubled over, unable to do any normal activities.       Discomfort / pressure 5. RELIEVING AND AGGRAVATING FACTORS: "What makes it better or worse?" (e.g., certain foods, lactose, medicines)     N/a 6. GI HISTORY: "Do you have any history of stomach or intestine problems?" (e.g., bowel obstruction, cancer, irritable bowel)      History of Le. Pylori 7. CAUSE: "What do you think is causing the bloating?"      Possibly Le. Pylori 8. OTHER SYMPTOMS: "Do you have any other symptoms?" (e.g., belching, blood in stool, breathing difficulty, constipation, diarrhea, fever, passing gas, vomiting, weight loss, white of eyes have turned  yellow)     No SOB 9. PREGNANCY: "Is there any chance you are pregnant?" "When was your last menstrual period?"     no  Protocols used: Abdomen Bloating and Swelling-A-AH  Chief Complaint: abd bloating, diarrhea Symptoms: n/v, abd pain/pressure Frequency: started yesterday Pertinent Negatives: Patient denies SOB Disposition: [] ED /[] Urgent Care (no appt availability in office) / [x] Appointment(In office/virtual)/ []  [facility]/ [] Home Care/ [] Refused Recommended Disposition /[] [facility]/ []  Follow-up with PCP Additional Notes: pt stated history of Le. Pylori and these s/s feel like she has it again. S/s started yesterday with abd bloating, abd pain/pressure, n/v, watery diarrhea, feverish last night around midnight but none now.  Pt stated restless/sleepless night due to discomfort. In office appt made for today with another provider at same practice.

## 2023-01-25 ENCOUNTER — Encounter: Payer: Self-pay | Admitting: Medical

## 2023-01-25 LAB — CLOSTRIDIUM DIFFICILE BY PCR: Toxigenic C. Difficile by PCR: NEGATIVE

## 2023-01-25 MED ORDER — CIPROFLOXACIN HCL 500 MG PO TABS: 500.0000 mg | ORAL_TABLET | Freq: Two times a day (BID) | ORAL | 0 refills | Status: DC

## 2023-01-25 NOTE — Addendum Note (Signed)
Addended by: Gwenevere Abbot on: 01/25/2023 09:42 AM   Modules accepted: Orders

## 2023-01-28 ENCOUNTER — Encounter: Payer: Self-pay | Admitting: Medical

## 2023-01-28 LAB — OVA AND PARASITE EXAMINATION
CONCENTRATE RESULT:: NONE SEEN
MICRO NUMBER:: 15866258
SPECIMEN QUALITY:: ADEQUATE
TRICHROME RESULT:: NONE SEEN

## 2023-01-28 MED ORDER — FLUCONAZOLE 150 MG PO TABS: 150.0000 mg | ORAL_TABLET | Freq: Every day | ORAL | 0 refills | Status: DC

## 2023-01-28 NOTE — Addendum Note (Signed)
Addended by: Gwenevere Abbot on: 01/28/2023 04:28 PM   Modules accepted: Orders

## 2023-01-31 ENCOUNTER — Ambulatory Visit: Payer: Managed Care, Other (non HMO) | Admitting: Medical

## 2023-01-31 VITALS — BP 112/72 | HR 97 | Temp 98.7°F | Resp 18 | Ht 63.0 in | Wt 200.6 lb

## 2023-01-31 DIAGNOSIS — H6690 Otitis media, unspecified, unspecified ear: Secondary | ICD-10-CM

## 2023-01-31 DIAGNOSIS — J029 Acute pharyngitis, unspecified: Secondary | ICD-10-CM | POA: Diagnosis not present

## 2023-01-31 DIAGNOSIS — J3489 Other specified disorders of nose and nasal sinuses: Secondary | ICD-10-CM | POA: Diagnosis not present

## 2023-01-31 DIAGNOSIS — R051 Acute cough: Secondary | ICD-10-CM

## 2023-01-31 MED ORDER — BENZONATATE 100 MG PO CAPS: 100.0000 mg | ORAL_CAPSULE | Freq: Three times a day (TID) | ORAL | 0 refills | Status: DC | PRN

## 2023-01-31 MED ORDER — FLUTICASONE PROPIONATE 50 MCG/ACT NA SUSP
2.0000 | Freq: Every day | NASAL | 1 refills | Status: AC
Start: 2023-01-31 — End: ?

## 2023-01-31 MED ORDER — AZITHROMYCIN 250 MG PO TABS
ORAL_TABLET | ORAL | 0 refills | Status: AC
Start: 2023-01-31 — End: 2023-02-05

## 2023-01-31 NOTE — Patient Instructions (Addendum)
Early otitis media and sinus pressure (following URI like signs/symptoms) -Rapid onset of symptoms including  uri like symptoms with pharyngitis, maxillary sinus pressure, productive cough, and possible early otitis media. No fever, chills, sweats, body aches, wheezing, or shortness of breath. -Start Azithromycin for coverage of ears, sinuses, and throat. -Continue Flonase. -Add Benzonatate for cough. -Follow up as needed if symptoms persist or worsen.

## 2023-01-31 NOTE — Progress Notes (Signed)
Subjective:    Patient ID: Vicki Le, female    DOB: Jul 01, 1965, 57 y.o.   MRN: [id number]  HPI Discussed the use of AI scribe software for clinical note transcription with the patient, who gave verbal consent to proceed.  History of Present Illness   The patient presents with upper respiratory symptoms that began two days prior. He describes a sensation of swollen glands and a sore throat, along with pressure in the sinuses. The patient denies experiencing fever, chills, sweats, or body aches. He reports a productive cough, with expectoration of unpleasant-tasting sputum. However, he denies any chest congestion or shortness of breath.  The patient has been self-medicating with over-the-counter Mucinex and Delsym, which were initiated the day before the consultation. He also mentions being on Flonase. The patient's symptoms appear to be localized to the throat and maxillary sinuses.  The patient's spouse was recently treated for similar symptoms with a Z-Pak, which was reported to have helped significantly. The patient has previously taken azithromycin and found it effective. He also reports that benzonatate works for him, unlike his spouse.        Review of Systems  Constitutional:  Negative for chills and fatigue.  HENT:  Positive for congestion, sinus pressure, sinus pain and sore throat.   Respiratory:  Positive for cough. Negative for shortness of breath and wheezing.   Cardiovascular:  Negative for chest pain and palpitations.  Hematological:  Positive for adenopathy. Does not bruise/bleed easily.  Psychiatric/Behavioral:  Negative for behavioral problems and confusion.    Past Medical History:  Diagnosis Date   Anxiety    Chicken pox    Depression    GERD (gastroesophageal reflux disease)    Melanoma (HCC)    left buttock   Sleep apnea    Urticaria      Social History   Socioeconomic History   Marital status: Married    Spouse name: Not on file   Number of  children: 0   Years of education: Not on file   Highest education level: 12th grade  Occupational History   Occupation: Advertising account planner  Tobacco Use   Smoking status: Never    Passive exposure: Never   Smokeless tobacco: Never  Vaping Use   Vaping status: Never Used  Substance and Sexual Activity   Alcohol use: Yes    Comment: occassionally   Drug use: No   Sexual activity: Yes    Partners: Male  Other Topics Concern   Not on file  Social History Narrative   No exercise   Social Drivers of Health   Financial Resource Strain: Low Risk  (01/31/2023)   Overall Financial Resource Strain (CARDIA)    Difficulty of Paying Living Expenses: Not very hard  Food Insecurity: No Food Insecurity (01/31/2023)   Hunger Vital Sign    Worried About Running Out of Food in the Last Year: Never true    Ran Out of Food in the Last Year: Never true  Transportation Needs: No Transportation Needs (01/31/2023)   PRAPARE - Administrator, Civil Service (Medical): No    Lack of Transportation (Non-Medical): No  Physical Activity: Insufficiently Active (01/31/2023)   Exercise Vital Sign    Days of Exercise per Week: 3 days    Minutes of Exercise per Session: 10 min  Stress: Stress Concern Present (01/31/2023)   [employer] of Occupational Health - Occupational Stress Questionnaire    Feeling of Stress : To some extent  Social Connections:  Unknown (01/31/2023)   Social Connection and Isolation Panel [NHANES]    Frequency of Communication with Friends and Family: More than three times a week    Frequency of Social Gatherings with Friends and Family: Once a week    Attends Religious Services: Patient declined    Database administrator or Organizations: No    Attends Engineer, structural: Not on file    Marital Status: Married  Intimate Partner Violence: Not on file    Past Surgical History:  Procedure Laterality Date   MELANOMA EXCISION  2013   skin cancer removal   August 14, 2011   2 lymph nodes removed as well   TONSILECTOMY/ADENOIDECTOMY WITH MYRINGOTOMY     WRIST SURGERY  1991/1192   ganglion cyst r wrist    Family History  Problem Relation Age of Onset   Allergic rhinitis Mother    Depression Mother    Kidney Stones Mother    Allergic rhinitis Father    Alzheimer's disease Father    Colon polyps Father        suspicious polyps and 8 in of colon removed ? colon cancer    Depression Father    Hypertension Father    Allergic rhinitis Sister    Allergic rhinitis Sister    Allergic rhinitis Sister    Breast cancer Paternal Aunt    Breast cancer Maternal Grandmother    Colon cancer Neg Hx    Esophageal cancer Neg Hx    Rectal cancer Neg Hx    Stomach cancer Neg Hx     Allergies  Allergen Reactions   Vicodin [Hydrocodone-Acetaminophen] Nausea And Vomiting    Current Outpatient Medications on File Prior to Visit  Medication Sig Dispense Refill   albuterol (VENTOLIN HFA) 108 (90 Base) MCG/ACT inhaler TAKE 2 PUFFS BY MOUTH EVERY 6 HOURS AS NEEDED FOR WHEEZE OR SHORTNESS OF BREATH 8.5 each 2   Ascorbic Acid (VITAMIN C) 1000 MG tablet Take 1,000 mg by mouth daily.     azelastine (OPTIVAR) 0.05 % ophthalmic solution Place 1 drop into both eyes 2 (two) times daily. 6 mL 3   Azelastine HCl (ASTEPRO) 0.15 % SOLN Place 1 spray into the nose 2 (two) times daily.     buPROPion (WELLBUTRIN XL) 150 MG 24 hr tablet Take 3 tablets (450 mg total) by mouth daily. 270 tablet 1   cholecalciferol (VITAMIN D) 1000 UNITS tablet Take 1,000 Units by mouth daily.     ciprofloxacin (CIPRO) 500 MG tablet Take 1 tablet (500 mg total) by mouth 2 (two) times daily. 14 tablet 0   Ergocalciferol 10 MCG (400 UNIT) TABS Take 1 tablet by mouth daily.     famotidine (PEPCID) 20 MG tablet Take 1 tablet (20 mg total) by mouth daily. 30 tablet 0   famotidine (PEPCID) 40 MG tablet Take 1 tablet (40 mg total) by mouth daily. 90 tablet 3   fexofenadine (ALLEGRA ALLERGY) 180 MG  tablet Take 1 tablet (180 mg total) by mouth daily.     fluconazole (DIFLUCAN) 150 MG tablet Take 1 tablet (150 mg total) by mouth daily. 1 tablet 0   fluticasone (FLONASE) 50 MCG/ACT nasal spray Place 2 sprays into both nostrils daily. 48 g 3   levocetirizine (XYZAL) 5 MG tablet Take 1 tablet (5 mg total) by mouth every evening. 30 tablet 5   MIMVEY 1-0.5 MG tablet Take 1 tablet by mouth daily.     montelukast (SINGULAIR) 10 MG tablet TAKE 1  TABLET BY MOUTH EVERYDAY AT BEDTIME 90 tablet 1   ondansetron (ZOFRAN) 4 MG tablet Take 1 tablet (4 mg total) by mouth every 8 (eight) hours as needed for nausea or vomiting. 20 tablet 0   pantoprazole (PROTONIX) 40 MG tablet TAKE 1 TABLET (40 MG TOTAL) BY MOUTH DAILY. 90 tablet 2   Semaglutide-Weight Management (WEGOVY) 0.5 MG/0.5ML SOAJ Inject 0.5 mg into the skin once a week. (Patient not taking: Reported on 06/21/2022) 2 mL 0   valACYclovir (VALTREX) 500 MG tablet Take 500 mg by mouth daily.     zinc gluconate 50 MG tablet Take 50 mg by mouth daily.     No current facility-administered medications on file prior to visit.    BP 112/72   Pulse 97   Temp 98.7 F (37.1 C)   Resp 18   Ht 5\' 3"  (1.6 m)   Wt 200 lb 9.6 oz (91 kg)   LMP 02/09/2016   SpO2 99%   BMI 35.53 kg/m        Objective:   Physical Exam  General- No acute distress. Pleasant patient. Neck- Full range of motion, no jvd Lungs- Clear, even and unlabored. Heart- regular rate and rhythm. Neurologic- CNII- XII grossly intact.  Heent- maxillary sinus pressure, moderate red pharynx and left tm moderate central redness. Mild enlarged sumbamndibular nodes symmetric. Faint tenderness.     Assessment & Plan:   Patient Instructions  Otitis media and sinus pressure following URI. -Rapid onset of symptoms including pharyngitis, maxillary sinus pressure, productive cough, and possible early otitis media. No fever, chills, sweats, body aches, wheezing, or shortness of breath. -Start  Azithromycin for coverage of ears, sinuses, and throat. -Continue Flonase. -Add Benzonatate for cough. -Follow up as needed if symptoms persist or worsen.

## 2023-02-05 ENCOUNTER — Encounter: Payer: Self-pay | Admitting: Medical

## 2023-02-05 NOTE — Addendum Note (Signed)
Addended by: Gwenevere Abbot on: 02/05/2023 05:21 PM   Modules accepted: Orders

## 2023-02-07 ENCOUNTER — Ambulatory Visit (HOSPITAL_BASED_OUTPATIENT_CLINIC_OR_DEPARTMENT_OTHER)
Admission: RE | Admit: 2023-02-07 | Discharge: 2023-02-07 | Disposition: A | Discharge status: Home / Self Care | Payer: Managed Care, Other (non HMO) | Source: Ambulatory Visit | Attending: Medical | Admitting: Medical

## 2023-02-07 DIAGNOSIS — R051 Acute cough: Secondary | ICD-10-CM | POA: Diagnosis present

## 2023-02-08 MED ORDER — AMOXICILLIN-POT CLAVULANATE 875-125 MG PO TABS: 1.0000 | ORAL_TABLET | Freq: Two times a day (BID) | ORAL | 0 refills | Status: DC

## 2023-02-11 MED ORDER — FLUCONAZOLE 150 MG PO TABS: ORAL_TABLET | ORAL | 0 refills | Status: DC

## 2023-02-14 ENCOUNTER — Other Ambulatory Visit: Payer: Self-pay | Admitting: Medical

## 2023-03-21 ENCOUNTER — Encounter: Payer: Self-pay | Admitting: Family Medicine

## 2023-05-01 ENCOUNTER — Telehealth: Payer: Self-pay | Admitting: Medical

## 2023-05-01 MED ORDER — OSELTAMIVIR PHOSPHATE 75 MG PO CAPS: ORAL_CAPSULE | ORAL | 0 refills | Status: DC

## 2023-05-01 NOTE — Telephone Encounter (Signed)
 Pt here with husband who has flu. Astmptomatic. Start tamiflu 75 mg daily x 10 days for prevention.

## 2023-05-12 ENCOUNTER — Encounter (HOSPITAL_BASED_OUTPATIENT_CLINIC_OR_DEPARTMENT_OTHER): Payer: Self-pay

## 2023-05-12 ENCOUNTER — Emergency Department (HOSPITAL_BASED_OUTPATIENT_CLINIC_OR_DEPARTMENT_OTHER)
Admission: EM | Admit: 2023-05-12 | Discharge: 2023-05-12 | Discharge status: Against Medical Advice | Attending: Emergency Medicine | Admitting: Emergency Medicine

## 2023-05-12 DIAGNOSIS — R197 Diarrhea, unspecified: Secondary | ICD-10-CM | POA: Diagnosis not present

## 2023-05-12 DIAGNOSIS — R1031 Right lower quadrant pain: Secondary | ICD-10-CM | POA: Diagnosis present

## 2023-05-12 DIAGNOSIS — Z5321 Procedure and treatment not carried out due to patient leaving prior to being seen by health care provider: Secondary | ICD-10-CM | POA: Insufficient documentation

## 2023-05-12 LAB — COMPREHENSIVE METABOLIC PANEL WITH GFR
ALT: 14 U/L (ref 0–44)
AST: 15 U/L (ref 15–41)
Albumin: 3.9 g/dL (ref 3.5–5.0)
Alkaline Phosphatase: 37 U/L — ABNORMAL LOW (ref 38–126)
Anion gap: 8 (ref 5–15)
BUN: 12 mg/dL (ref 6–20)
CO2: 22 mmol/L (ref 22–32)
Calcium: 8.7 mg/dL — ABNORMAL LOW (ref 8.9–10.3)
Chloride: 106 mmol/L (ref 98–111)
Creatinine, Ser: 0.84 mg/dL (ref 0.44–1.00)
GFR, Estimated: >60 mL/min (ref >60)
Glucose, Bld: 110 mg/dL — ABNORMAL HIGH (ref 70–99)
Potassium: 3.5 mmol/L (ref 3.5–5.1)
Sodium: 136 mmol/L (ref 135–145)
Total Bilirubin: 0.8 mg/dL (ref 0.0–1.2)
Total Protein: 6.9 g/dL (ref 6.5–8.1)

## 2023-05-12 LAB — CBC
HCT: 41.9 % (ref 36.0–46.0)
Hemoglobin: 14.2 g/dL (ref 12.0–15.0)
MCH: 32.1 pg (ref 26.0–34.0)
MCHC: 33.9 g/dL (ref 30.0–36.0)
MCV: 94.8 fL (ref 80.0–100.0)
Platelets: 289 10*3/uL (ref 150–400)
RBC: 4.42 MIL/uL (ref 3.87–5.11)
RDW: 12.9 % (ref 11.5–15.5)
WBC: 9.9 10*3/uL (ref 4.0–10.5)
nRBC: 0 % (ref 0.0–0.2)

## 2023-05-12 LAB — URINALYSIS, ROUTINE W REFLEX MICROSCOPIC
Bilirubin Urine: NEGATIVE
Glucose, UA: NEGATIVE mg/dL
Hgb urine dipstick: NEGATIVE
Ketones, ur: NEGATIVE mg/dL
Leukocytes,Ua: NEGATIVE
Nitrite: NEGATIVE
Protein, ur: NEGATIVE mg/dL
Specific Gravity, Urine: >=1.03 (ref 1.005–1.030)
pH: 5.5 (ref 5.0–8.0)

## 2023-05-12 LAB — LIPASE, BLOOD: Lipase: 27 U/L (ref 11–51)

## 2023-05-12 NOTE — ED Triage Notes (Signed)
 Pt reports abdominal pain since taking Tamiflu prophylactic after exposed to flu. Husband dx with flu 05/01/23. Pt took last dose on Friday.  Pain right lower quad pain and initially was severe, now decreased. Also could feel a knot which is no longer present. 3 episodes of diarrhea

## 2023-05-12 NOTE — ED Notes (Signed)
 Pt reports she is leaving due to pain being resolved.

## 2023-06-07 ENCOUNTER — Telehealth: Payer: Self-pay

## 2023-06-07 NOTE — Telephone Encounter (Signed)
 Initial Comment Caller states she is returning a missed, call she made an appointment for Tuesday. Caller states she went to the ER a few weeks ago for lower right stomach pain. Caller states the pain went away but is felt like a knot. Caller states it has happen a few more times since than. Caller states she was told she has stones a couple years ago but was advised to let the doctor know if she has pain. Translation No Nurse Assessment Nurse: Arita Belch, RN, Kacie Date/Time (Eastern Time): 06/06/2023 8:51:35 AM Confirm and document reason for call. If symptomatic, describe symptoms. ---Caller states went to ER about 2 weeks ago for lower right stomach pain. States pain went away while in ED, but felt like a knot. She ended up leaving ED without being seen. This has happen a few more times since than. States was told she has gallstones a couple years ago. Was advised to let the doctor know if she has pain. Denies fever. Does the patient have any new or worsening symptoms? ---Yes Will a triage be completed? ---Yes Related visit to physician within the last 2 weeks? ---No Does the PT have any chronic conditions? (i.e. diabetes, asthma, this includes High risk factors for pregnancy, etc.) ---No Is this a behavioral health or substance abuse call? ---No Guidelines Guideline Title Affirmed Question Affirmed Notes Nurse Date/Time Redgie Cancer Time) Abdominal Pain - Female Abdominal pain is a chronic symptom (recurrent or ongoing Hearon, RN, Kacie 06/06/2023 8:54:13 AM PLEASE NOTE: All timestamps contained within this report are represented as [place] Standard Time. CONFIDENTIALTY NOTICE: This fax transmission is intended only for the addressee. It contains information that is legally privileged, confidential or otherwise protected from use or disclosure. If you are not the intended recipient, you are strictly prohibited from reviewing, disclosing, copying using or disseminating any of this  information or taking any action in reliance on or regarding this information. If you have received this fax in error, please notify us  immediately by telephone so that we can arrange for its return to us . Phone: [phone], Toll-Free: [phone], Fax: [phone] [place]DRA[place]NS 1965-08-23 Page: 2 of 2 CallId: [id number] Guidelines Guideline Title Affirmed Question Affirmed Notes Nurse Date/Time Redgie Cancer Time) AND present > 4 weeks) Disp. Time Redgie Cancer Time) Disposition Final User 06/06/2023 9:00:29 AM See PCP within 2 Weeks Yes Arita Belch, RN, Kacie Final Disposition 06/06/2023 9:00:29 AM See PCP within 2 Weeks Yes Hearon, RN, Kacie Caller Disagree/Comply Comply Caller Understands Yes PreDisposition Did not know what to do Care Advice Given Per Guideline SEE PCP WITHIN 2 WEEKS: * You need to be seen for this ongoing problem within the next 2 weeks. CARE ADVICE given per Abdominal Pain - Female (Adult) guideline. * You become worse CALL BACK IF: Comments User: Kacie, Hearon, RN Date/Time (Eastern Time): 06/06/2023 9:00:25 AM Caller states she already has made an appt on MyChart for May 6th

## 2023-06-11 ENCOUNTER — Encounter: Payer: Self-pay | Admitting: Medical

## 2023-06-11 ENCOUNTER — Ambulatory Visit

## 2023-06-11 ENCOUNTER — Ambulatory Visit (INDEPENDENT_AMBULATORY_CARE_PROVIDER_SITE_OTHER): Admitting: Medical

## 2023-06-11 VITALS — BP 140/80 | HR 90 | Temp 98.5°F | Resp 16 | Ht 63.0 in | Wt 196.0 lb

## 2023-06-11 DIAGNOSIS — R102 Pelvic and perineal pain: Secondary | ICD-10-CM | POA: Diagnosis not present

## 2023-06-11 DIAGNOSIS — K439 Ventral hernia without obstruction or gangrene: Secondary | ICD-10-CM | POA: Diagnosis not present

## 2023-06-11 DIAGNOSIS — R1031 Right lower quadrant pain: Secondary | ICD-10-CM | POA: Diagnosis not present

## 2023-06-11 LAB — COMPREHENSIVE METABOLIC PANEL WITH GFR
ALT: 8 U/L (ref 0–35)
AST: 10 U/L (ref 0–37)
Albumin: 4.3 g/dL (ref 3.5–5.2)
Alkaline Phosphatase: 38 U/L — ABNORMAL LOW (ref 39–117)
BUN: 9 mg/dL (ref 6–23)
CO2: 28 meq/L (ref 19–32)
Calcium: 9.2 mg/dL (ref 8.4–10.5)
Chloride: 103 meq/L (ref 96–112)
Creatinine, Ser: 0.64 mg/dL (ref 0.40–1.20)
GFR: 97.32 mL/min (ref >60.00)
Glucose, Bld: 89 mg/dL (ref 70–99)
Potassium: 4.2 meq/L (ref 3.5–5.1)
Sodium: 139 meq/L (ref 135–145)
Total Bilirubin: 0.6 mg/dL (ref 0.2–1.2)
Total Protein: 6.4 g/dL (ref 6.0–8.3)

## 2023-06-11 LAB — POC URINALSYSI DIPSTICK (AUTOMATED)
Bilirubin, UA: NEGATIVE
Blood, UA: NEGATIVE
Glucose, UA: NEGATIVE
Leukocytes, UA: NEGATIVE
Nitrite, UA: NEGATIVE
Protein, UA: NEGATIVE
Spec Grav, UA: 1.025 (ref 1.010–1.025)
Urobilinogen, UA: 0.2 U/dL
pH, UA: 5 (ref 5.0–8.0)

## 2023-06-11 LAB — CBC WITH DIFFERENTIAL/PLATELET
Basophils Absolute: 0.1 10*3/uL (ref 0.0–0.1)
Basophils Relative: 0.8 % (ref 0.0–3.0)
Eosinophils Absolute: 0.2 10*3/uL (ref 0.0–0.7)
Eosinophils Relative: 2.6 % (ref 0.0–5.0)
HCT: 44.1 % (ref 36.0–46.0)
Hemoglobin: 14.6 g/dL (ref 12.0–15.0)
Lymphocytes Relative: 23.8 % (ref 12.0–46.0)
Lymphs Abs: 1.8 10*3/uL (ref 0.7–4.0)
MCHC: 33.1 g/dL (ref 30.0–36.0)
MCV: 97.9 fl (ref 78.0–100.0)
Monocytes Absolute: 0.5 10*3/uL (ref 0.1–1.0)
Monocytes Relative: 6.6 % (ref 3.0–12.0)
Neutro Abs: 5 10*3/uL (ref 1.4–7.7)
Neutrophils Relative %: 66.2 % (ref 43.0–77.0)
Platelets: 300 10*3/uL (ref 150.0–400.0)
RBC: 4.5 Mil/uL (ref 3.87–5.11)
RDW: 12.6 % (ref 11.5–15.5)
WBC: 7.5 10*3/uL (ref 4.0–10.5)

## 2023-06-11 MED ORDER — IOHEXOL 300 MG/ML  SOLN
100.0000 mL | Freq: Once | INTRAMUSCULAR | Status: AC | PRN
  Administered 2023-06-11: 100 mL via INTRAVENOUS

## 2023-06-11 MED ORDER — IOHEXOL 9 MG/ML PO SOLN
500.0000 mL | ORAL | Status: AC
  Administered 2023-06-11 (×2): 500 mL via ORAL

## 2023-06-11 NOTE — Progress Notes (Signed)
 Subjective:    Patient ID: Vicki Le, female    DOB: 04/02/1965, 58 y.o.   MRN: [id number]  HPI  Vicki Le is a 58 year old female who presents with intermittent right lower quadrant abdominal pain.  She has been experiencing severe, intermittent right lower quadrant abdominal pain for a few weeks, described as a 'hard knot', which prevents her from bending over or sitting down. The pain occurs sporadically, a couple of times a week, and lasts for minutes, with the most recent episode occurring two days ago and lasting about ten minutes. Initially, she visited the emergency room, but the pain subsided while in the triage area, leading her to leave without treatment.  In addition to the right lower quadrant pain, she experiences occasional discomfort in the right upper quadrant. Two years ago, an ultrasound identified immobile gallstones in this area. The discomfort is a nagging ache, less severe and less frequent than the right lower quadrant pain.  She has no significant changes in appetite, although she sometimes feels bloated. She reports having loose stools but not uncontrollable diarrhea, and she has not had a solid bowel movement in a while. No fever, chills, or sweats are associated with the abdominal pain.     Review of Systems  Constitutional:  Negative for chills, fatigue and fever.  HENT:  Negative for congestion, ear discharge and ear pain.   Respiratory:  Negative for cough, chest tightness and wheezing.   Cardiovascular:  Negative for chest pain and palpitations.  Gastrointestinal:  Positive for abdominal pain. Negative for abdominal distention, blood in stool, constipation, diarrhea, nausea and vomiting.  Genitourinary:  Negative for dysuria and flank pain.  Musculoskeletal:  Negative for back pain and myalgias.  Skin:  Negative for rash.  Neurological:  Negative for dizziness, syncope and weakness.  Hematological:  Negative for adenopathy. Does not bruise/bleed  easily.  Psychiatric/Behavioral:  Negative for behavioral problems, decreased concentration and hallucinations.    Past Medical History:  Diagnosis Date   Anxiety    Chicken pox    Depression    GERD (gastroesophageal reflux disease)    Melanoma (HCC)    left buttock   Sleep apnea    Urticaria      Social History   Socioeconomic History   Marital status: Married    Spouse name: Not on file   Number of children: 0   Years of education: Not on file   Highest education level: 12th grade  Occupational History   Occupation: Advertising account planner  Tobacco Use   Smoking status: Never    Passive exposure: Never   Smokeless tobacco: Never  Vaping Use   Vaping status: Never Used  Substance and Sexual Activity   Alcohol use: Yes    Comment: occassionally   Drug use: No   Sexual activity: Yes    Partners: Male  Other Topics Concern   Not on file  Social History Narrative   No exercise   Social Drivers of Health   Financial Resource Strain: Low Risk  (01/31/2023)   Overall Financial Resource Strain (CARDIA)    Difficulty of Paying Living Expenses: Not very hard  Food Insecurity: No Food Insecurity (01/31/2023)   Hunger Vital Sign    Worried About Running Out of Food in the Last Year: Never true    Ran Out of Food in the Last Year: Never true  Transportation Needs: No Transportation Needs (01/31/2023)   PRAPARE - Transportation    Lack of Transportation (  Medical): No    Lack of Transportation (Non-Medical): No  Physical Activity: Insufficiently Active (01/31/2023)   Exercise Vital Sign    Days of Exercise per Week: 3 days    Minutes of Exercise per Session: 10 min  Stress: Stress Concern Present (01/31/2023)   [employer] of Occupational Health - Occupational Stress Questionnaire    Feeling of Stress : To some extent  Social Connections: Unknown (01/31/2023)   Social Connection and Isolation Panel [NHANES]    Frequency of Communication with Friends and Family: More  than three times a week    Frequency of Social Gatherings with Friends and Family: Once a week    Attends Religious Services: Patient declined    Database administrator or Organizations: No    Attends Engineer, structural: Not on file    Marital Status: Married  Intimate Partner Violence: Not on file    Past Surgical History:  Procedure Laterality Date   MELANOMA EXCISION  2013   skin cancer removal  August 14, 2011   2 lymph nodes removed as well   TONSILECTOMY/ADENOIDECTOMY WITH MYRINGOTOMY     WRIST SURGERY  1991/1192   ganglion cyst r wrist    Family History  Problem Relation Age of Onset   Allergic rhinitis Mother    Depression Mother    Kidney Stones Mother    Allergic rhinitis Father    Alzheimer's disease Father    Colon polyps Father        suspicious polyps and 8 in of colon removed ? colon cancer    Depression Father    Hypertension Father    Allergic rhinitis Sister    Allergic rhinitis Sister    Allergic rhinitis Sister    Breast cancer Paternal Aunt    Breast cancer Maternal Grandmother    Colon cancer Neg Hx    Esophageal cancer Neg Hx    Rectal cancer Neg Hx    Stomach cancer Neg Hx     Allergies  Allergen Reactions   Vicodin [Hydrocodone -Acetaminophen] Nausea And Vomiting    Current Outpatient Medications on File Prior to Visit  Medication Sig Dispense Refill   albuterol  (VENTOLIN  HFA) 108 (90 Base) MCG/ACT inhaler TAKE 2 PUFFS BY MOUTH EVERY 6 HOURS AS NEEDED FOR WHEEZE OR SHORTNESS OF BREATH 8.5 each 2   Ascorbic Acid (VITAMIN C) 1000 MG tablet Take 1,000 mg by mouth daily.     azelastine  (OPTIVAR ) 0.05 % ophthalmic solution Place 1 drop into both eyes 2 (two) times daily. 6 mL 3   Azelastine  HCl (ASTEPRO ) 0.15 % SOLN Place 1 spray into the nose 2 (two) times daily.     buPROPion  (WELLBUTRIN  XL) 150 MG 24 hr tablet Take 3 tablets (450 mg total) by mouth daily. 270 tablet 1   cholecalciferol (VITAMIN D) 1000 UNITS tablet Take 1,000 Units by  mouth daily.     Ergocalciferol 10 MCG (400 UNIT) TABS Take 1 tablet by mouth daily.     famotidine  (PEPCID ) 40 MG tablet Take 1 tablet (40 mg total) by mouth daily. 90 tablet 3   fexofenadine  (ALLEGRA  ALLERGY ) 180 MG tablet Take 1 tablet (180 mg total) by mouth daily.     fluconazole  (DIFLUCAN ) 150 MG tablet A tab po day 1 and repeat in 3 days if needed 2 tablet 0   fluticasone  (FLONASE ) 50 MCG/ACT nasal spray Place 2 sprays into both nostrils daily. 48 g 3   fluticasone  (FLONASE ) 50 MCG/ACT nasal spray Place 2  sprays into both nostrils daily. 16 g 1   levocetirizine (XYZAL ) 5 MG tablet Take 1 tablet (5 mg total) by mouth every evening. 30 tablet 5   MIMVEY 1-0.5 MG tablet Take 1 tablet by mouth daily.     montelukast  (SINGULAIR ) 10 MG tablet TAKE 1 TABLET BY MOUTH EVERYDAY AT BEDTIME 90 tablet 1   pantoprazole  (PROTONIX ) 40 MG tablet TAKE 1 TABLET (40 MG TOTAL) BY MOUTH DAILY. 90 tablet 2   valACYclovir (VALTREX) 500 MG tablet Take 500 mg by mouth daily.     zinc gluconate 50 MG tablet Take 50 mg by mouth daily.     famotidine  (PEPCID ) 20 MG tablet TAKE 1 TABLET BY MOUTH EVERY DAY 90 tablet 1   No current facility-administered medications on file prior to visit.    BP (!) 148/90   Pulse 90   Temp 98.5 F (36.9 C) (Oral)   Resp 16   Ht 5\' 3"  (1.6 m)   Wt 196 lb (88.9 kg)   LMP 02/09/2016   SpO2 99%   BMI 34.72 kg/m    140/80 bp on recheck.       Objective:   Physical Exam  General- No acute distress. Pleasant patient. Neck- Full range of motion, no jvd Lungs- Clear, even and unlabored. Heart- regular rate and rhythm. Neurologic- CNII- XII grossly intact.  Abdomen- mild faint rt lower quadrant/adnexal region tenderness. No mass. No hernia. +bs, no rebound or guarding. No organomegaly. No heel jar pain. No pain on rotation of rle. Back- no cva tenderness.         Assessment & Plan:   Patient Instructions  Right lower quadrant abdominal pain Intermittent  high-intensity pain for a month. Differential includes appendicitis, ovarian cyst, diverticulitis. CT abdomen and pelvis with contrast recommended for evaluation. - Order CT abdomen and pelvis with contrast. Prior authorization required. - Order CBC and metabolic panel. - Obtain urine sample for hematuria. - Advise emergency care if pain becomes constant and severe. -update me if signs/symptoms change of worsen pending ct prior auth  Gallstones Immobile gallstones identified in 2020. Occasional right upper quadrant discomfort, less severe than right lower quadrant pain. - Monitor symptoms.  Follow up date to be determined after lab and imaging review.   Keary Waterson, PA-C

## 2023-06-11 NOTE — Patient Instructions (Signed)
 Right lower quadrant abdominal pain Intermittent high-intensity pain for a month. Differential includes appendicitis, ovarian cyst, diverticulitis. CT abdomen and pelvis with contrast recommended for evaluation. - Order CT abdomen and pelvis with contrast. Prior authorization required. - Order CBC and metabolic panel. - Obtain urine sample for hematuria. - Advise emergency care if pain becomes constant and severe. -update me if signs/symptoms change of worsen pending ct prior auth  Gallstones Immobile gallstones identified in 2020. Occasional right upper quadrant discomfort, less severe than right lower quadrant pain. - Monitor symptoms.  Follow up date to be determined after lab and imaging review.

## 2023-06-11 NOTE — Addendum Note (Signed)
 Addended by: Serafina Damme on: 06/11/2023 08:13 PM   Modules accepted: Orders

## 2023-06-11 NOTE — Addendum Note (Signed)
 Addended by: Serafina Damme on: 06/11/2023 08:20 PM   Modules accepted: Orders

## 2023-06-13 ENCOUNTER — Ambulatory Visit (HOSPITAL_BASED_OUTPATIENT_CLINIC_OR_DEPARTMENT_OTHER)
Admission: RE | Admit: 2023-06-13 | Discharge: 2023-06-13 | Disposition: A | Discharge status: Home / Self Care | Source: Ambulatory Visit | Attending: Medical | Admitting: Medical

## 2023-06-13 ENCOUNTER — Encounter: Payer: Self-pay | Admitting: Medical

## 2023-06-13 DIAGNOSIS — R1031 Right lower quadrant pain: Secondary | ICD-10-CM | POA: Insufficient documentation

## 2023-06-14 NOTE — Addendum Note (Signed)
 Addended by: Serafina Damme on: 06/14/2023 09:27 PM   Modules accepted: Orders

## 2023-06-20 ENCOUNTER — Other Ambulatory Visit: Payer: Self-pay | Admitting: Family Medicine

## 2023-06-20 ENCOUNTER — Encounter: Payer: Self-pay | Admitting: Family Medicine

## 2023-06-20 DIAGNOSIS — K219 Gastro-esophageal reflux disease without esophagitis: Secondary | ICD-10-CM

## 2023-06-21 ENCOUNTER — Encounter: Payer: Self-pay | Admitting: Medical

## 2023-06-21 MED ORDER — PANTOPRAZOLE SODIUM 40 MG PO TBEC: 40.0000 mg | DELAYED_RELEASE_TABLET | Freq: Every day | ORAL | 2 refills | Status: DC

## 2023-07-07 ENCOUNTER — Other Ambulatory Visit: Payer: Self-pay | Admitting: Family Medicine

## 2023-07-07 DIAGNOSIS — F32A Depression, unspecified: Secondary | ICD-10-CM

## 2023-07-08 ENCOUNTER — Ambulatory Visit: Payer: Self-pay | Admitting: Surgery

## 2023-07-16 ENCOUNTER — Other Ambulatory Visit (HOSPITAL_COMMUNITY): Payer: Self-pay

## 2023-07-16 ENCOUNTER — Telehealth: Payer: Self-pay | Admitting: Pharmacy Technician

## 2023-07-16 NOTE — Telephone Encounter (Signed)
 Pharmacy Patient Advocate Encounter   Received notification from CoverMyMeds that prior authorization for buPROPion  HCl ER (XL) 150MG  er tablets is required/requested.   Insurance verification completed.   The patient is insured through [employer] .   Per test claim: Refill too soon. PA is not needed at this time. Medication was filled 07/08/2023. Next eligible fill date is 10/04/2023.

## 2023-07-21 ENCOUNTER — Other Ambulatory Visit: Payer: Self-pay | Admitting: Family Medicine

## 2023-07-21 DIAGNOSIS — J302 Other seasonal allergic rhinitis: Secondary | ICD-10-CM

## 2023-07-28 NOTE — Progress Notes (Signed)
 COVID Vaccine received:  [x]  No []  Yes Date of any COVID positive Test in last 90 days:  none  PCP -  Dallas Maxwell, PA-C  [phone] (Work)   [phone] (Fax)  Cardiologist - none  Chest x-ray - 02-07-2023 2v EKG -  12-11-2019  will repeat Stress Test - 10-18-2014  Epic ECHO - 10-12-2014  Epic Cardiac Cath -  CT Coronary Calcium score:   Bowel Prep - [x]  No  []   Yes ______ Comments: Per Erminio, no specific bowel prep even though Dr. Sheldon ordered Ensures x3   Pacemaker / ICD device [x]  No []  Yes   Spinal Cord Stimulator:[x]  No []  Yes       History of Sleep Apnea? []  No [x]  Yes  Study 2023 CPAP used?- [x]  No []  Yes  claustrophobia  Does the patient monitor blood sugar?   [x]  N/A   []  No []  Yes  Patient has: [x]  NO Hx DM   []  Pre-DM   []  DM1  []   DM2  Blood Thinner / Instructions:  none Aspirin  Instructions:   none  ERAS Protocol Ordered: []  No  [x]  Yes PRE-SURGERY [x]  ENSURE  x3   []  G2   Patient is to be NPO after: 0430  Dental hx: []  Dentures:  []  N/A      []  Bridge or Partial:                   []  Loose or Damaged teeth:  Cracked tooth on the bottom right # 31 per patient  Activity level: Able to walk up 2 flights of stairs without becoming significantly short of breath or having chest pain?  []  No   [x]    Yes   Anesthesia review: HTN- no meds, OSA- no CPAP (claustrophobia), GERD, Anxiety, ? LFTs  Patient denies shortness of breath, fever, cough and chest pain at PAT appointment.  Patient verbalized understanding and agreement to the Pre-Surgical Instructions that were given to them at this PAT appointment. Patient was also educated of the need to review these PAT instructions again prior to her surgery.I reviewed the appropriate phone numbers to call if they have any and questions or concerns.

## 2023-07-28 NOTE — Patient Instructions (Signed)
 SURGICAL WAITING ROOM VISITATION Patients having surgery or a procedure may have no more than 2 support people in the waiting area - these visitors may rotate in the visitor waiting room.   If the patient needs to stay at the hospital during part of their recovery, the visitor guidelines for inpatient rooms apply.  PRE-OP VISITATION  Pre-op nurse will coordinate an appropriate time for 1 support person to accompany the patient in pre-op.  This support person may not rotate.  This visitor will be contacted when the time is appropriate for the visitor to come back in the pre-op area.  Please refer to the [facility] website for the visitor guidelines for Inpatients (after your surgery is over and you are in a regular room).  You are not required to quarantine at this time prior to your surgery. However, you must do this: Hand Hygiene often Do NOT share personal items Notify your provider if you are in close contact with someone who has COVID or you develop fever 100.4 or greater, new onset of sneezing, cough, sore throat, shortness of breath or body aches.  If you test positive for Covid or have been in contact with anyone that has tested positive in the last 10 days please notify you surgeon.    Your procedure is scheduled on:  THURSDAY  August 15, 2023  Report to [facility] Main Entrance: [address] entrance where the [employer] is available.   Report to admitting at:  05:15   AM  Call this number if you have any questions or problems the morning of surgery [phone]  FOLLOW ANY ADDITIONAL PRE OP INSTRUCTIONS YOU RECEIVED FROM YOUR SURGEON'S OFFICE!!!  DRINK two (2) bottles of Pre-Surgery Clear Ensure drink starting at 6:00 pm the evening prior to your surgery to help prevent dehydration. Increase drinking clear fluids (see list below)          Do not eat food after Midnight the night prior to your surgery/procedure.  After Midnight you may have the following liquids until     04:30 AM  DAY OF SURGERY  Clear Liquid Diet Water Black Coffee (sugar ok, NO MILK/CREAM OR CREAMERS)  Tea (sugar ok, NO MILK/CREAM OR CREAMERS) regular and decaf                             Plain Jell-O  with no fruit (NO RED)                                           Fruit ices (not with fruit pulp, NO RED)                                     Popsicles (NO RED)                                                                  Juice: NO CITRUS JUICES: only apple, WHITE grape, WHITE cranberry Sports drinks like Gatorade or Powerade (NO RED)  The day of surgery:  Drink ONE (1) Pre-Surgery Clear Ensure at   04:30     AM the morning of surgery. Drink in one sitting. Do not sip.  This drink was given to you during your hospital pre-op appointment visit. Nothing else to drink after completing the Pre-Surgery Clear Ensure: No candy, chewing gum or throat lozenges.     Oral Hygiene is also important to reduce your risk of infection.        Remember - BRUSH YOUR TEETH THE MORNING OF SURGERY WITH YOUR REGULAR TOOTHPASTE  Do NOT smoke after Midnight the night before surgery.  STOP TAKING all Vitamins, Herbs and supplements 1 week before your surgery.   Take ONLY these medicines the morning of surgery with A SIP OF WATER: Pantoprazole , bupropion , fexofenadine , and you may use Flonase  nasal spray if needed.                     You may not have any metal on your body including hair pins, jewelry, and body piercing  Do not wear make-up, lotions, powders, perfumes or deodorant  Do not wear nail polish including gel and S&S, artificial / acrylic nails, or any other type of covering on natural nails including finger and toenails. If you have artificial nails, gel coating, etc., that needs to be removed by a nail salon, Please have this removed prior to surgery. Not doing so may mean that your surgery could be cancelled or delayed if the Surgeon or anesthesia staff feels like they  are unable to monitor you safely.   Do not shave 48 hours prior to surgery to avoid nicks in your skin which may contribute to postoperative infections.    Contacts, Hearing Aids, dentures or bridgework may not be worn into surgery. DENTURES WILL BE REMOVED PRIOR TO SURGERY PLEASE DO NOT APPLY Poly grip OR ADHESIVES!!!  Patients discharged on the day of surgery will not be allowed to drive home.  Someone NEEDS to stay with you for the first 24 hours after anesthesia.  Do not bring your home medications to the hospital. The Pharmacy will dispense medications listed on your medication list to you during your admission in the Hospital.   Please read over the following fact sheets you were given: IF YOU HAVE QUESTIONS ABOUT YOUR PRE-OP INSTRUCTIONS, PLEASE CALL [phone]   [facility] - Preparing for Surgery Before surgery, you can play an important role.  Because skin is not sterile, your skin needs to be as free of germs as possible.  You can reduce the number of germs on your skin by washing with CHG (chlorahexidine gluconate) soap before surgery.  CHG is an antiseptic cleaner which kills germs and bonds with the skin to continue killing germs even after washing. Please DO NOT use if you have an allergy  to CHG or antibacterial soaps.  If your skin becomes reddened/irritated stop using the CHG and inform your nurse when you arrive at Short Stay. Do not shave (including legs and underarms) for at least 48 hours prior to the first CHG shower.  You may shave your face/neck.  Please follow these instructions carefully:  1.  Shower with CHG Soap the night before surgery and the  morning of surgery.  2.  If you choose to wash your hair, wash your hair first as usual with your normal  shampoo.  3.  After you shampoo, rinse your hair and body thoroughly to remove the shampoo.  4.  Use CHG as you would any other liquid soap.  You can apply chg directly to the skin and  wash.  Gently with a scrungie or clean washcloth.  5.  Apply the CHG Soap to your body ONLY FROM THE NECK DOWN.   Do not use on face/ open                           Wound or open sores. Avoid contact with eyes, ears mouth and genitals (private parts).                       Wash face,  Genitals (private parts) with your normal soap.             6.  Wash thoroughly, paying special attention to the area where your  surgery  will be performed.  7.  Thoroughly rinse your body with warm water from the neck down.  8.  DO NOT shower/wash with your normal soap after using and rinsing off the CHG Soap.            9.  Pat yourself dry with a clean towel.            10.  Wear clean pajamas.            11.  Place clean sheets on your bed the night of your first shower and do not  sleep with pets.  ON THE DAY OF SURGERY : Do not apply any lotions/deodorants the morning of surgery.  Please wear clean clothes to the hospital/surgery center.     FAILURE TO FOLLOW THESE INSTRUCTIONS MAY RESULT IN THE CANCELLATION OF YOUR SURGERY  PATIENT SIGNATURE_________________________________  NURSE SIGNATURE__________________________________  ________________________________________________________________________

## 2023-07-30 ENCOUNTER — Encounter (HOSPITAL_COMMUNITY)
Admission: RE | Admit: 2023-07-30 | Discharge: 2023-07-30 | Disposition: A | Discharge status: Home / Self Care | Source: Ambulatory Visit | Attending: Surgery | Admitting: Surgery

## 2023-07-30 ENCOUNTER — Other Ambulatory Visit: Payer: Self-pay

## 2023-07-30 ENCOUNTER — Encounter (HOSPITAL_COMMUNITY): Payer: Self-pay

## 2023-07-30 VITALS — BP 146/86 | HR 92 | Temp 98.4°F | Resp 16 | Ht 63.0 in | Wt 195.0 lb

## 2023-07-30 DIAGNOSIS — Z01818 Encounter for other preprocedural examination: Secondary | ICD-10-CM | POA: Insufficient documentation

## 2023-07-30 DIAGNOSIS — I10 Essential (primary) hypertension: Secondary | ICD-10-CM | POA: Diagnosis not present

## 2023-07-30 DIAGNOSIS — R7989 Other specified abnormal findings of blood chemistry: Secondary | ICD-10-CM | POA: Diagnosis not present

## 2023-07-30 HISTORY — DX: Pneumonia, unspecified organism: J18.9

## 2023-07-30 HISTORY — DX: Essential (primary) hypertension: I10

## 2023-07-30 LAB — COMPREHENSIVE METABOLIC PANEL WITH GFR
ALT: 10 U/L (ref 0–44)
AST: 14 U/L — ABNORMAL LOW (ref 15–41)
Albumin: 3.9 g/dL (ref 3.5–5.0)
Alkaline Phosphatase: 33 U/L — ABNORMAL LOW (ref 38–126)
Anion gap: 11 (ref 5–15)
BUN: 11 mg/dL (ref 6–20)
CO2: 23 mmol/L (ref 22–32)
Calcium: 8.5 mg/dL — ABNORMAL LOW (ref 8.9–10.3)
Chloride: 104 mmol/L (ref 98–111)
Creatinine, Ser: 0.5 mg/dL (ref 0.44–1.00)
GFR, Estimated: >60 mL/min (ref >60)
Glucose, Bld: 95 mg/dL (ref 70–99)
Potassium: 3.7 mmol/L (ref 3.5–5.1)
Sodium: 138 mmol/L (ref 135–145)
Total Bilirubin: 1 mg/dL (ref 0.0–1.2)
Total Protein: 6.5 g/dL (ref 6.5–8.1)

## 2023-07-30 LAB — CBC
HCT: 43.2 % (ref 36.0–46.0)
Hemoglobin: 13.9 g/dL (ref 12.0–15.0)
MCH: 32.8 pg (ref 26.0–34.0)
MCHC: 32.2 g/dL (ref 30.0–36.0)
MCV: 101.9 fL — ABNORMAL HIGH (ref 80.0–100.0)
Platelets: 272 10*3/uL (ref 150–400)
RBC: 4.24 MIL/uL (ref 3.87–5.11)
RDW: 12.6 % (ref 11.5–15.5)
WBC: 8 10*3/uL (ref 4.0–10.5)
nRBC: 0 % (ref 0.0–0.2)

## 2023-08-14 NOTE — Anesthesia Preprocedure Evaluation (Signed)
 Anesthesia Evaluation  Patient identified by MRN, date of birth, ID band Patient awake    Reviewed: Allergy  & Precautions, H&P , NPO status , Patient's Chart, lab work & pertinent test results  Airway Mallampati: II  TM Distance: >3 FB Neck ROM: Full    Dental no notable dental hx.    Pulmonary sleep apnea    Pulmonary exam normal breath sounds clear to auscultation       Cardiovascular hypertension, (-) CAD Normal cardiovascular exam Rhythm:Regular Rate:Normal     Neuro/Psych  PSYCHIATRIC DISORDERS Anxiety Depression    negative neurological ROS     GI/Hepatic Neg liver ROS,GERD  ,,  Endo/Other  negative endocrine ROS    Renal/GU negative Renal ROS  negative genitourinary   Musculoskeletal negative musculoskeletal ROS (+)    Abdominal   Peds negative pediatric ROS (+)  Hematology negative hematology ROS (+)   Anesthesia Other Findings   Reproductive/Obstetrics negative OB ROS                              Anesthesia Physical Anesthesia Plan  ASA: 2  Anesthesia Plan: General   Post-op Pain Management: Tylenol  PO (pre-op)* and Celebrex  PO (pre-op)*   Induction: Intravenous  PONV Risk Score and Plan: 3 and Ondansetron , Dexamethasone , Midazolam  and Treatment may vary due to age or medical condition  Airway Management Planned: Oral ETT  Additional Equipment: None  Intra-op Plan:   Post-operative Plan: Extubation in OR  Informed Consent: I have reviewed the patients History and Physical, chart, labs and discussed the procedure including the risks, benefits and alternatives for the proposed anesthesia with the patient or authorized representative who has indicated his/her understanding and acceptance.     Dental advisory given  Plan Discussed with: CRNA  Anesthesia Plan Comments:          Anesthesia Quick Evaluation

## 2023-08-15 ENCOUNTER — Ambulatory Visit (HOSPITAL_BASED_OUTPATIENT_CLINIC_OR_DEPARTMENT_OTHER): Payer: Self-pay

## 2023-08-15 ENCOUNTER — Ambulatory Visit (HOSPITAL_COMMUNITY)
Admission: RE | Admit: 2023-08-15 | Discharge: 2023-08-15 | Disposition: A | Discharge status: Home / Self Care | Source: Ambulatory Visit | Attending: Surgery | Admitting: Surgery

## 2023-08-15 ENCOUNTER — Other Ambulatory Visit: Payer: Self-pay

## 2023-08-15 ENCOUNTER — Encounter (HOSPITAL_COMMUNITY): Payer: Self-pay | Admitting: Surgery

## 2023-08-15 ENCOUNTER — Encounter (HOSPITAL_COMMUNITY): Payer: Self-pay

## 2023-08-15 ENCOUNTER — Encounter (HOSPITAL_COMMUNITY)
Admission: RE | Disposition: A | Discharge status: Home / Self Care | Payer: Self-pay | Source: Ambulatory Visit | Attending: Surgery

## 2023-08-15 DIAGNOSIS — K4030 Unilateral inguinal hernia, with obstruction, without gangrene, not specified as recurrent: Secondary | ICD-10-CM | POA: Diagnosis not present

## 2023-08-15 DIAGNOSIS — K4090 Unilateral inguinal hernia, without obstruction or gangrene, not specified as recurrent: Secondary | ICD-10-CM | POA: Diagnosis not present

## 2023-08-15 DIAGNOSIS — E66811 Obesity, class 1: Secondary | ICD-10-CM | POA: Diagnosis not present

## 2023-08-15 DIAGNOSIS — G4730 Sleep apnea, unspecified: Secondary | ICD-10-CM | POA: Diagnosis not present

## 2023-08-15 DIAGNOSIS — K219 Gastro-esophageal reflux disease without esophagitis: Secondary | ICD-10-CM | POA: Insufficient documentation

## 2023-08-15 DIAGNOSIS — Z6834 Body mass index (BMI) 34.0-34.9, adult: Secondary | ICD-10-CM | POA: Diagnosis not present

## 2023-08-15 DIAGNOSIS — I10 Essential (primary) hypertension: Secondary | ICD-10-CM | POA: Insufficient documentation

## 2023-08-15 DIAGNOSIS — K436 Other and unspecified ventral hernia with obstruction, without gangrene: Secondary | ICD-10-CM | POA: Diagnosis present

## 2023-08-15 HISTORY — PX: XI ROBOTIC ASSISTED VENTRAL HERNIA: SHX6789

## 2023-08-15 SURGERY — REPAIR, HERNIA, VENTRAL, ROBOT-ASSISTED
Anesthesia: General | Site: Abdomen

## 2023-08-15 MED ORDER — BUPIVACAINE LIPOSOME 1.3 % IJ SUSP
INTRAMUSCULAR | Status: AC
Start: 2023-08-15 — End: 2023-08-15
  Filled 2023-08-15: qty 20

## 2023-08-15 MED ORDER — ACETAMINOPHEN 500 MG PO TABS
1000.0000 mg | ORAL_TABLET | Freq: Once | ORAL | Status: AC
  Administered 2023-08-15: 1000 mg via ORAL
  Filled 2023-08-15: qty 2

## 2023-08-15 MED ORDER — MIDAZOLAM HCL 2 MG/2ML IJ SOLN
INTRAMUSCULAR | Status: AC
  Filled 2023-08-15: qty 2

## 2023-08-15 MED ORDER — KETAMINE HCL 50 MG/5ML IJ SOSY
PREFILLED_SYRINGE | INTRAMUSCULAR | Status: AC
  Filled 2023-08-15: qty 5

## 2023-08-15 MED ORDER — CEFAZOLIN SODIUM-DEXTROSE 2-4 GM/100ML-% IV SOLN
2.0000 g | INTRAVENOUS | Status: AC
  Administered 2023-08-15: 2 g via INTRAVENOUS
  Filled 2023-08-15: qty 100

## 2023-08-15 MED ORDER — CHLORHEXIDINE GLUCONATE CLOTH 2 % EX PADS: 6.0000 | MEDICATED_PAD | Freq: Once | CUTANEOUS | Status: DC

## 2023-08-15 MED ORDER — ROCURONIUM BROMIDE 100 MG/10ML IV SOLN
INTRAVENOUS | Status: DC | PRN
  Administered 2023-08-15: 60 mg via INTRAVENOUS
  Administered 2023-08-15: 20 mg via INTRAVENOUS

## 2023-08-15 MED ORDER — BUPIVACAINE-EPINEPHRINE 0.25% -1:200000 IJ SOLN
INTRAMUSCULAR | Status: DC | PRN
  Administered 2023-08-15: 80 mL

## 2023-08-15 MED ORDER — DEXAMETHASONE SODIUM PHOSPHATE 10 MG/ML IJ SOLN
INTRAMUSCULAR | Status: DC | PRN
  Administered 2023-08-15: 8 mg via INTRAVENOUS

## 2023-08-15 MED ORDER — DEXAMETHASONE SODIUM PHOSPHATE 10 MG/ML IJ SOLN
INTRAMUSCULAR | Status: AC
Start: 2023-08-15 — End: 2023-08-15
  Filled 2023-08-15: qty 1

## 2023-08-15 MED ORDER — OXYCODONE HCL 5 MG PO TABS
5.0000 mg | ORAL_TABLET | Freq: Once | ORAL | Status: AC | PRN
  Administered 2023-08-15: 5 mg via ORAL

## 2023-08-15 MED ORDER — OXYCODONE HCL 5 MG PO TABS
ORAL_TABLET | ORAL | Status: AC
  Filled 2023-08-15: qty 1

## 2023-08-15 MED ORDER — FENTANYL CITRATE PF 50 MCG/ML IJ SOSY
PREFILLED_SYRINGE | INTRAMUSCULAR | Status: AC
  Filled 2023-08-15: qty 1

## 2023-08-15 MED ORDER — BUPIVACAINE-EPINEPHRINE (PF) 0.25% -1:200000 IJ SOLN
INTRAMUSCULAR | Status: AC
Start: 2023-08-15 — End: 2023-08-15
  Filled 2023-08-15: qty 60

## 2023-08-15 MED ORDER — FENTANYL CITRATE PF 50 MCG/ML IJ SOSY
25.0000 ug | PREFILLED_SYRINGE | INTRAMUSCULAR | Status: DC | PRN
  Administered 2023-08-15 (×3): 25 ug via INTRAVENOUS

## 2023-08-15 MED ORDER — ACETAMINOPHEN 10 MG/ML IV SOLN: 1000.0000 mg | Freq: Once | INTRAVENOUS | Status: DC | PRN

## 2023-08-15 MED ORDER — FENTANYL CITRATE (PF) 100 MCG/2ML IJ SOLN
INTRAMUSCULAR | Status: DC | PRN
  Administered 2023-08-15: 75 ug via INTRAVENOUS
  Administered 2023-08-15: 25 ug via INTRAVENOUS

## 2023-08-15 MED ORDER — MIDAZOLAM HCL 5 MG/5ML IJ SOLN
INTRAMUSCULAR | Status: DC | PRN
  Administered 2023-08-15: 2 mg via INTRAVENOUS

## 2023-08-15 MED ORDER — BUPIVACAINE LIPOSOME 1.3 % IJ SUSP: 20.0000 mL | Freq: Once | INTRAMUSCULAR | Status: DC

## 2023-08-15 MED ORDER — ENSURE PRE-SURGERY PO LIQD
296.0000 mL | Freq: Once | ORAL | Status: DC
  Filled 2023-08-15: qty 296

## 2023-08-15 MED ORDER — GLYCOPYRROLATE 0.2 MG/ML IJ SOLN
INTRAMUSCULAR | Status: DC | PRN
  Administered 2023-08-15: .2 mg via INTRAVENOUS

## 2023-08-15 MED ORDER — ROCURONIUM BROMIDE 10 MG/ML (PF) SYRINGE
PREFILLED_SYRINGE | INTRAVENOUS | Status: AC
  Filled 2023-08-15: qty 10

## 2023-08-15 MED ORDER — KETAMINE HCL 50 MG/5ML IJ SOSY
PREFILLED_SYRINGE | INTRAMUSCULAR | Status: DC | PRN
Start: 2023-08-15 — End: 2023-08-15
  Administered 2023-08-15: 30 mg via INTRAVENOUS

## 2023-08-15 MED ORDER — 0.9 % SODIUM CHLORIDE (POUR BTL) OPTIME
TOPICAL | Status: DC | PRN
  Administered 2023-08-15: 1000 mL

## 2023-08-15 MED ORDER — LIDOCAINE HCL (CARDIAC) PF 100 MG/5ML IV SOSY
PREFILLED_SYRINGE | INTRAVENOUS | Status: DC | PRN
Start: 2023-08-15 — End: 2023-08-15
  Administered 2023-08-15: 100 mg via INTRAVENOUS

## 2023-08-15 MED ORDER — GLYCOPYRROLATE 0.2 MG/ML IJ SOLN
INTRAMUSCULAR | Status: AC
  Filled 2023-08-15: qty 1

## 2023-08-15 MED ORDER — PROPOFOL 10 MG/ML IV BOLUS
INTRAVENOUS | Status: DC | PRN
  Administered 2023-08-15: 150 mg via INTRAVENOUS
  Administered 2023-08-15: 50 mg via INTRAVENOUS

## 2023-08-15 MED ORDER — CHLORHEXIDINE GLUCONATE 0.12 % MT SOLN
15.0000 mL | Freq: Once | OROMUCOSAL | Status: AC
  Administered 2023-08-15: 15 mL via OROMUCOSAL

## 2023-08-15 MED ORDER — OXYCODONE HCL 5 MG/5ML PO SOLN: 5.0000 mg | Freq: Once | ORAL | Status: AC | PRN

## 2023-08-15 MED ORDER — LACTATED RINGERS IV SOLN: INTRAVENOUS | Status: DC

## 2023-08-15 MED ORDER — ORAL CARE MOUTH RINSE: 15.0000 mL | Freq: Once | OROMUCOSAL | Status: AC

## 2023-08-15 MED ORDER — PROPOFOL 10 MG/ML IV BOLUS
INTRAVENOUS | Status: AC
  Filled 2023-08-15: qty 20

## 2023-08-15 MED ORDER — SUGAMMADEX SODIUM 200 MG/2ML IV SOLN
INTRAVENOUS | Status: DC | PRN
  Administered 2023-08-15: 150 mg via INTRAVENOUS
  Administered 2023-08-15: 50 mg via INTRAVENOUS

## 2023-08-15 MED ORDER — ACETAMINOPHEN 500 MG PO TABS: 1000.0000 mg | ORAL_TABLET | ORAL | Status: DC

## 2023-08-15 MED ORDER — PHENYLEPHRINE 80 MCG/ML (10ML) SYRINGE FOR IV PUSH (FOR BLOOD PRESSURE SUPPORT)
PREFILLED_SYRINGE | INTRAVENOUS | Status: AC
  Filled 2023-08-15: qty 10

## 2023-08-15 MED ORDER — PHENYLEPHRINE 80 MCG/ML (10ML) SYRINGE FOR IV PUSH (FOR BLOOD PRESSURE SUPPORT)
PREFILLED_SYRINGE | INTRAVENOUS | Status: DC | PRN
  Administered 2023-08-15: 80 ug via INTRAVENOUS

## 2023-08-15 MED ORDER — GABAPENTIN 300 MG PO CAPS
300.0000 mg | ORAL_CAPSULE | ORAL | Status: AC
  Administered 2023-08-15: 300 mg via ORAL
  Filled 2023-08-15: qty 1

## 2023-08-15 MED ORDER — TRAMADOL HCL 50 MG PO TABS: 50.0000 mg | ORAL_TABLET | Freq: Four times a day (QID) | ORAL | 0 refills | Status: DC | PRN

## 2023-08-15 MED ORDER — ONDANSETRON HCL 4 MG/2ML IJ SOLN
INTRAMUSCULAR | Status: DC | PRN
  Administered 2023-08-15: 4 mg via INTRAVENOUS

## 2023-08-15 MED ORDER — ENSURE PRE-SURGERY PO LIQD
592.0000 mL | Freq: Once | ORAL | Status: DC
  Filled 2023-08-15: qty 592

## 2023-08-15 MED ORDER — DROPERIDOL 2.5 MG/ML IJ SOLN: 0.6250 mg | Freq: Once | INTRAMUSCULAR | Status: DC | PRN

## 2023-08-15 MED ORDER — FENTANYL CITRATE (PF) 100 MCG/2ML IJ SOLN
INTRAMUSCULAR | Status: AC
  Filled 2023-08-15: qty 2

## 2023-08-15 MED ORDER — EPHEDRINE 5 MG/ML INJ
INTRAVENOUS | Status: AC
  Filled 2023-08-15: qty 5

## 2023-08-15 MED ORDER — ONDANSETRON HCL 4 MG/2ML IJ SOLN
INTRAMUSCULAR | Status: AC
Start: 2023-08-15 — End: 2023-08-15
  Filled 2023-08-15: qty 2

## 2023-08-15 MED ORDER — LIDOCAINE HCL (PF) 2 % IJ SOLN
INTRAMUSCULAR | Status: AC
  Filled 2023-08-15: qty 5

## 2023-08-15 MED ORDER — METHOCARBAMOL 500 MG PO TABS: 500.0000 mg | ORAL_TABLET | Freq: Three times a day (TID) | ORAL | 1 refills | Status: DC | PRN

## 2023-08-15 MED ORDER — CELECOXIB 200 MG PO CAPS
200.0000 mg | ORAL_CAPSULE | Freq: Once | ORAL | Status: DC
  Filled 2023-08-15: qty 1

## 2023-08-15 MED ORDER — HYDROMORPHONE HCL 1 MG/ML IJ SOLN
INTRAMUSCULAR | Status: AC
Start: 2023-08-15 — End: 2023-08-15
  Filled 2023-08-15: qty 1

## 2023-08-15 MED ORDER — CELECOXIB 200 MG PO CAPS
200.0000 mg | ORAL_CAPSULE | ORAL | Status: AC
  Administered 2023-08-15: 200 mg via ORAL

## 2023-08-15 MED ORDER — HYDROMORPHONE HCL 1 MG/ML IJ SOLN
0.2500 mg | INTRAMUSCULAR | Status: DC | PRN
  Administered 2023-08-15: 0.25 mg via INTRAVENOUS

## 2023-08-15 SURGICAL SUPPLY — 53 items
APPLICATOR COTTON TIP 6 STRL (MISCELLANEOUS) ×1 IMPLANT
BAG COUNTER SPONGE SURGICOUNT (BAG) ×1 IMPLANT
BLADE SURG SZ11 CARB STEEL (BLADE) ×1 IMPLANT
CHLORAPREP W/TINT 26 (MISCELLANEOUS) ×1 IMPLANT
CLIP APPLIE 5 13 M/L LIGAMAX5 (MISCELLANEOUS) IMPLANT
COVER SURGICAL LIGHT HANDLE (MISCELLANEOUS) ×1 IMPLANT
COVER TIP SHEARS 8 DVNC (MISCELLANEOUS) IMPLANT
DRAIN CHANNEL 19F RND (DRAIN) IMPLANT
DRAPE ARM DVNC X/XI (DISPOSABLE) ×4 IMPLANT
DRAPE COLUMN DVNC XI (DISPOSABLE) ×1 IMPLANT
DRIVER NDL LRG 8 DVNC XI (INSTRUMENTS) IMPLANT
DRIVER NDL MEGA SUTCUT DVNCXI (INSTRUMENTS) IMPLANT
DRIVER NDLE LRG 8 DVNC XI (INSTRUMENTS) IMPLANT
DRIVER NDLE MEGA SUTCUT DVNCXI (INSTRUMENTS) IMPLANT
DRSG TEGADERM 2-3/8X2-3/4 SM (GAUZE/BANDAGES/DRESSINGS) ×5 IMPLANT
ELECT REM PT RETURN 15FT ADLT (MISCELLANEOUS) ×1 IMPLANT
EVACUATOR DRAINAGE 10X20 100CC (DRAIN) IMPLANT
EVACUATOR SILICONE 100CC (DRAIN) IMPLANT
FORCEPS PROGRASP DVNC XI (FORCEP) IMPLANT
GAUZE SPONGE 2X2 8PLY STRL LF (GAUZE/BANDAGES/DRESSINGS) ×1 IMPLANT
GLOVE ECLIPSE 8.0 STRL XLNG CF (GLOVE) ×2 IMPLANT
GLOVE INDICATOR 8.0 STRL GRN (GLOVE) ×2 IMPLANT
GOWN STRL REUS W/ TWL XL LVL3 (GOWN DISPOSABLE) ×2 IMPLANT
GRASPER SUT TROCAR 14GX15 (MISCELLANEOUS) IMPLANT
GRASPER TIP-UP FEN DVNC XI (INSTRUMENTS) IMPLANT
IRRIGATION SUCT STRKRFLW 2 WTP (MISCELLANEOUS) IMPLANT
KIT BASIN OR (CUSTOM PROCEDURE TRAY) ×1 IMPLANT
KIT TURNOVER KIT A (KITS) ×1 IMPLANT
MESH BARD SOFT 6X6IN (Mesh General) IMPLANT
NDL HYPO 22X1.5 SAFETY MO (MISCELLANEOUS) ×1 IMPLANT
NEEDLE HYPO 22X1.5 SAFETY MO (MISCELLANEOUS) ×1 IMPLANT
PACK CARDIOVASCULAR III (CUSTOM PROCEDURE TRAY) ×1 IMPLANT
PAD POSITIONING PINK XL (MISCELLANEOUS) ×1 IMPLANT
PENCIL SMOKE EVACUATOR (MISCELLANEOUS) IMPLANT
SCISSORS LAP 5X45 EPIX DISP (ENDOMECHANICALS) IMPLANT
SCISSORS MNPLR CVD DVNC XI (INSTRUMENTS) IMPLANT
SEAL UNIV 5-12 XI (MISCELLANEOUS) ×4 IMPLANT
SEALER VESSEL EXT DVNC XI (MISCELLANEOUS) ×1 IMPLANT
SOLUTION ELECTROSURG ANTI STCK (MISCELLANEOUS) ×1 IMPLANT
SPIKE FLUID TRANSFER (MISCELLANEOUS) ×1 IMPLANT
SUT ETHIBOND 0 36 GRN (SUTURE) IMPLANT
SUT ETHIBOND NAB CT1 #1 30IN (SUTURE) IMPLANT
SUT MNCRL AB 4-0 PS2 18 (SUTURE) ×1 IMPLANT
SUT PROLENE 2 0 SH DA (SUTURE) IMPLANT
SUT VICRYL 0 UR6 27IN ABS (SUTURE) IMPLANT
SUTURE STRTFX SPRL PDS+ 2-0 23 (SUTURE) IMPLANT
SYR 10ML LL (SYRINGE) ×1 IMPLANT
SYR 20ML LL LF (SYRINGE) ×1 IMPLANT
TOWEL OR 17X26 10 PK STRL BLUE (TOWEL DISPOSABLE) ×1 IMPLANT
TRAY FOLEY MTR SLVR 14FR STAT (SET/KITS/TRAYS/PACK) IMPLANT
TRAY FOLEY MTR SLVR 16FR STAT (SET/KITS/TRAYS/PACK) IMPLANT
TROCAR ADV FIXATION 5X100MM (TROCAR) IMPLANT
TUBING INSUFFLATION 10FT LAP (TUBING) ×1 IMPLANT

## 2023-08-15 NOTE — Op Note (Addendum)
 08/15/2023  9:50 AM  PATIENT:  Vicki Le  58 y.o. female  Patient Care Team: Antonio Meth, Jamee SAUNDERS, DO as PCP - General (Family Medicine) Okey Leader, MD as Consulting Physician (Obstetrics and Gynecology) Robinson Pao, MD as Consulting Physician (Dermatology) Sheldon Standing, MD as Consulting Physician (General Surgery)  PRE-OPERATIVE DIAGNOSIS:  SPIGELIAN INCARCERATED ABDOMINAL WALL HERNIA  POST-OPERATIVE DIAGNOSIS:   INCARCERATED RIGHT INGUINAL HERNIA LEFT INGUINAL HERNIA  PROCEDURE:  ROBOTIC BILATERAL INGUINAL HERNIA REPAIRS TRANSVERSUS ABDOMINIS PLANE (TAP) BLOCK - BILATERAL  SURGEON:  Standing KYM Sheldon, MD  ASSISTANT:  (n/a)   ANESTHESIA:  General endotracheal intubation anesthesia (GETA) and Regional TRANSVERSUS ABDOMINIS PLANE (TAP) nerve block -BILATERAL for perioperative & postoperative pain control at the level of the transverse abdominis & preperitoneal spaces along the flank at the anterior axillary line, from subcostal ridge to iliac crest under laparoscopic guidance provided with liposomal bupivacaine  (Experel) 20mL mixed with 50 mL of bupivicaine 0.25% with epinephrine   Estimated Blood Loss (EBL):   Total I/O In: 1120 [I.V.:1020; IV Piggyback:100] Out: 205 [Urine:200; Blood:5].   (See anesthesia record)  Delay start of Pharmacological VTE agent (>24hrs) due to concerns of significant anemia, surgical blood loss, or risk of bleeding?:  no  DRAINS: (None)  SPECIMEN:  (no specimen)  DISPOSITION OF SPECIMEN:  (not applicable)  COUNTS:  Sponge, needle, & instrument counts CORRECT at the conclusion of the case.      PLAN OF CARE: Discharge to home after PACU  PATIENT DISPOSITION:  PACU - hemodynamically stable.  INDICATION: Pleasant woman's abdominal discomfort and found to have a right lower quadrant mass suspicious for a left spigelian incarcerated hernia.  Given discomfort location recommendation made for minimally invasive exploration and repair.  Rule  out contralateral hernias.  The anatomy & physiology of the abdominal wall and pelvic floor was discussed.  The pathophysiology of hernias in the inguinal and pelvic region was discussed.  Natural history risks such as progressive enlargement, pain, incarceration & strangulation was discussed.   Contributors to complications such as smoking, obesity, diabetes, prior surgery, etc were discussed.    I feel the risks of no intervention will lead to serious problems that outweigh the operative risks; therefore, I recommended surgery to reduce and repair the hernia.  I explained laparoscopic techniques with possible need for an open approach.  I noted usual use of mesh to patch and/or buttress hernia repair  Risks such as bleeding, infection, abscess, need for further treatment, heart attack, death, and other risks were discussed.  I noted a good likelihood this will help address the problem.   Goals of post-operative recovery were discussed as well.  Possibility that this will not correct all symptoms was explained.  I stressed the importance of low-impact activity, aggressive pain control, avoiding constipation, & not pushing through pain to minimize risk of post-operative chronic pain or injury. Possibility of reherniation was discussed.  We will work to minimize complications.     An educational handout further explaining the pathology & treatment options was given as well.  Questions were answered.  The patient expresses understanding & wishes to proceed with surgery.  OR FINDINGS:  High riding indirect inguinal hernia incarcerated with fat.  No definite spigelian or direct space hernias.  No femoral hernia.  Mild laxity at obturator foramen but no significant hernia.  On the left side subtle but definite indirect inguinal hernia.  No direct space spigelian direct space nor obturator hernias.  Concern for possible 5mm hernia umbilical  stalk but none present by intra-abdominal exploration.  Apex of  midline mesh underneath umbilicus  DESCRIPTION:  Informed consent was confirmed.  The patient underwent general anaesthesia without difficulty.  The patient was positioned appropriately.  VTE prevention in place.  The patient's abdomen was clipped, prepped, & draped in a sterile fashion.  Surgical timeout confirmed our plan.  Peritoneal entry with a laparoscopic port was obtained using Varess spring needle entry technique in the left upper abdomen as the patient was positioned in reverse Trendelenburg.  I induced carbon dioxide insufflation.  No change in end tidal CO2 measurements.  Full symmetrical abdominal distention.  Initial port was carefully placed.  Camera inspection revealed no injury.  Extra ports were carefully placed under direct laparoscopic visualization.  Findings as noted above.  Robot was carefully docked.  Proceeded with a TAPP like approach for hernia repair.  Scored through the peritoneum transversely a few centimeters superior to the umbilicus.  I used careful focused sharp cautery scissors and blunt dissection to free the peritoneum off the infraumbilical anterior abdominal wall.    I focused attention on the RIGHT pelvis since that was the dominant hernia side.   I used blunt & focused sharp dissection to free the peritoneum off the flank and down to the pubic rim.  I freed the anteriolateral bladder wall off the anteriolateral pelvic wall, sparing midline attachments.   I located a swath of peritoneum going into a hernia fascial defect at the  internal ring consistent with  an indirect inguinal hernia.  It was more inferior to the typical spigelian location I could find no hernia in the right lower quadrant.  I gradually freed the peritoneal hernia sac off safely and reduced it into the preperitoneal space.  I freed the peritoneum off the round ligament.  It was rather thinned out and cannot be safely spared.  I freed peritoneum off the retroperitoneum along the psoas muscle.   Inguinal canal cord lipoma was dissected away & removed.  Some no laxity in the femoral canal.  No direct space hernia.  Mild laxity at the obturator foramen but not consistent with a significant obturator hernia I checked & assured hemostasis.     I turned attention on the opposite  LEFT pelvis.  I did dissection in a similar, mirror-image fashion. The patient had an indirect inguinal hernia that was small but definite.  I was able to free peritoneum off and preserve the more dominant round ligament on the left side   Ingunial canal cord lipoma was dissected away & removed.    I checked & assured hemostasis.     I chose sheets of lightweight polypropylene Bard Soft Mesh 15x15cm, one for each side.  I cut a single sigmoid-shaped slit ~6cm from a corner of each mesh.  I placed the meshes into the preperitoneal space & laid them as overlapping diamonds such that at the inferior points, a 6x6 cm corner flap rested in the true anterolateral pelvis, covering the obturator & femoral foramina.   I allowed the bladder to return to the pubis, this helping tuck the corners of the mesh in the anteriolateral pelvis.  The medial corners overlapped each other across midline cephalad to the pubic rim.   Given the numerous hernias of moderate size, I placed a third 15x15cm mesh in the center as a vertical diamond.  The lateral wings of the mesh overlap across the direct spaces and internal rings where the dominant hernias were.  This provided good  coverage and reinforcement of the hernia repairs.  The midline mesh actually overlapped above the umbilicus to cover up that region.  I thought there may be a small umbilical hernia through the stalk but there was no evidence that on exam.  Nonetheless one to make sure that the midline mesh covered protect that region just in case because of the central mesh placement with good overlap, I did not place any tacks.  I closed the peritoneum transversely using 2-0 absorbable STRATAFIX  suture from the corners meeting in the midline.  Did some stitches to help stitch the superior edges of the mesh as to the posterior fascia as well along the way  I held the hernia sacs cephalad & evacuated carbon dioxide.  I closed the fascia with absorbable suture.  I closed the skin using 4-0 monocryl stitch.  Sterile dressings were applied.   The patient was extubated & arrived in the PACU in stable condition..  I had discussed postoperative care with the patient in the holding area.  Instructions are written in the chart.  I discussed operative findings, updated the patient's status, discussed probable steps to recovery, and gave postoperative recommendations to the patient's sister, Arland Hock.  Recommendations were made.  Questions were answered.  She expressed understanding & appreciation.   Elspeth KYM Schultze, M.D., F.A.C.S. Gastrointestinal and Minimally Invasive Surgery [facility] [address] [place], [place] [address] [phone] Main / Paging  08/15/2023 9:50 AM

## 2023-08-15 NOTE — Transfer of Care (Signed)
 Immediate Anesthesia Transfer of Care Note  Patient: Vicki Le  Procedure(s) Performed: REPAIR, HERNIA, VENTRAL, ROBOT-ASSISTED (Abdomen)  Patient Location: PACU  Anesthesia Type:General  Level of Consciousness: drowsy, patient cooperative, and responds to stimulation  Airway & Oxygen Therapy: Patient Spontanous Breathing and Patient connected to face mask oxygen  Post-op Assessment: Report given to RN and Post -op Vital signs reviewed and stable  Post vital signs: Reviewed and stable  Last Vitals:  Vitals Value Taken Time  BP 124/72 08/15/23 09:41  Temp 97   Pulse 97 08/15/23 09:42  Resp 17 08/15/23 09:42  SpO2 97 % 08/15/23 09:42  Vitals shown include unfiled device data.  Last Pain:  Vitals:   08/15/23 0620  TempSrc: Oral         Complications: No notable events documented.

## 2023-08-15 NOTE — Discharge Instructions (Signed)
 HERNIA REPAIR: POST OP INSTRUCTIONS  ######################################################################  EAT Gradually transition to a high fiber diet with a fiber supplement over the next few weeks after discharge.  Start with a pureed / full liquid diet (see below)  WALK Walk an hour a day.  Control your pain to do that.    CONTROL PAIN Control pain so that you can walk, sleep, tolerate sneezing/coughing, and go up/down stairs.  HAVE A BOWEL MOVEMENT DAILY Keep your bowels regular to avoid problems.  OK to try a laxative to override constipation.  OK to use an antidairrheal to slow down diarrhea.  Call if not better after 2 tries  CALL IF YOU HAVE PROBLEMS/CONCERNS Call if you are still struggling despite following these instructions. Call if you have concerns not answered by these instructions  ######################################################################    DIET: Follow a light bland diet & liquids the first 24 hours after arrival home, such as soup, liquids, starches, etc.  Be sure to drink plenty of fluids.  Quickly advance to a usual solid diet within a few days.  Avoid fast food or heavy meals as your are more likely to get nauseated or have irregular bowels.  A low-fat, high-fiber diet for the rest of your life is ideal.   Take your usually prescribed home medications unless otherwise directed.  PAIN CONTROL: Pain is best controlled by a usual combination of three different methods TOGETHER: Ice/Heat Over the counter pain medication Prescription pain medication Most patients will experience some swelling and bruising around the hernia(s) such as the bellybutton, groins, or old incisions.  Ice packs or heating pads (30-60 minutes up to 6 times a day) will help. Use ice for the first few days to help decrease swelling and bruising, then switch to heat to help relax tight/sore spots and speed recovery.  Some people prefer to use ice alone, heat alone, alternating  between ice & heat.  Experiment to what works for you.  Swelling and bruising can take several weeks to resolve.   It is helpful to take an over-the-counter pain medication regularly for the first few weeks.  Choose one of the following that works best for you: Naproxen (Aleve, etc)  Two 220mg  tabs twice a day Ibuprofen  (Advil , etc) Three 200mg  tabs four times a day (every meal & bedtime) Acetaminophen  (Tylenol , etc) 325-650mg  four times a day (every meal & bedtime) A  prescription for pain medication should be given to you upon discharge.  Take your pain medication as prescribed.  If you are having problems/concerns with the prescription medicine (does not control pain, nausea, vomiting, rash, itching, etc), please call us  [phone] to see if we need to switch you to a different pain medicine that will work better for you and/or control your side effect better. If you need a refill on your pain medication, please contact your pharmacy.  They will contact our office to request authorization. Prescriptions will not be filled after 5 pm or on week-ends.  AVOID GETTING CONSTIPATED.   Between the surgery and the pain medications, it is common to experience some constipation.  Drink plenty of liquids Take a fiber supplement 2 times day (such as Metamucil, Citrucel, FiberCon, MiraLax , etc) to have a bowel movement every day. If you have not had a BM by 2 days after surgery: -drink liquids only until you have a bowel movement -take MiraLAX  2 doses every 2 hours until you have a bowel movement   Wash / shower every day.  You may  shower over the dressings as they are waterproof.    It is good for closed incisions and even open wounds to be washed every day.  Shower every day.  Short baths are fine.  Wash the incisions and wounds clean with soap & water .    You may leave closed incisions open to air if it is dry.   You may cover the incision with clean gauze & replace it after your daily shower for  comfort.  TEGADERM & STERISTRIPS:  You have clear gauze band-aid dressings over your closed incision(s).  You also have skin tapes called Steristrips on your incisions.  Leave these in place.  You may trim any edges that curl up with clean scissors.  You may remove all dressings & tapes in the shower in 2 days = 7/12.    ACTIVITIES as tolerated:   You may resume regular (light) daily activities beginning the next day--such as daily self-care, walking, climbing stairs--gradually increasing activities as tolerated.  Control your pain so that you can walk an hour a day.  If you can walk 30 minutes without difficulty, it is safe to try more intense activity such as jogging, treadmill, bicycling, low-impact aerobics, swimming, etc. Save the most intensive and strenuous activity for last such as sit-ups, heavy lifting, contact sports, etc  Refrain from any heavy lifting or straining until you are off narcotics for pain control.   DO NOT PUSH THROUGH PAIN.  Let pain be your guide: If it hurts to do something, don't do it.  Pain is your body warning you to avoid that activity for another week until the pain goes down. You may drive when you are no longer taking prescription pain medication, you can comfortably wear a seatbelt, and you can safely maneuver your car and apply brakes. You may have sexual intercourse when it is comfortable.   FOLLOW UP in our office Please call [facility] at [phone] to set up an appointment to see your surgeon in the office for a follow-up appointment approximately 2-3 weeks after your surgery. Make sure that you call for this appointment the day you arrive home to insure a convenient appointment time.  9.  If you have disability of FMLA / Family leave forms, please bring the forms to the office for processing.  (do not give to your surgeon).  WHEN TO CALL US  [phone]: Poor pain control Reactions / problems with new medications (rash/itching, nausea, etc)  Fever over  101.5 F (38.5 C) Inability to urinate Nausea and/or vomiting Worsening swelling or bruising Continued bleeding from incision. Increased pain, redness, or drainage from the incision   The clinic staff is available to answer your questions during regular business hours (8:30am-5pm).  Please don't hesitate to call and ask to speak to one of our nurses for clinical concerns.   If you have a medical emergency, go to the nearest emergency room or call 911.  A surgeon from [facility] is always on call at the hospitals in [facility], [place] [address], [place], [place]  [address] ?  [address] [address], [place], [place]   [address] MAIN: [phone] ? TOLL FREE: [phone] ? FAX: [phone] [email or web address]

## 2023-08-15 NOTE — Interval H&P Note (Signed)
 History and Physical Interval Note:  08/15/2023 6:52 AM  Vicki Le  has presented today for surgery, with the diagnosis of SPIGELIAN RECURRENT AND INCARCERATED ABDOMINAL WALL HERNIA.  The various methods of treatment have been discussed with the patient and family. After consideration of risks, benefits and other options for treatment, the patient has consented to  Procedure(s): REPAIR, HERNIA, VENTRAL, ROBOT-ASSISTED (N/A) as a surgical intervention.  The patient's history has been reviewed, patient examined, no change in status, stable for surgery.  I have reviewed the patient's chart and labs.  Questions were answered to the patient's satisfaction.    I have re-reviewed the the patient's records, history, medications, and allergies.  I have re-examined the patient.  I again discussed intraoperative plans and goals of post-operative recovery.  The patient agrees to proceed.  Vicki Le  1965/09/23 [id number]  Patient Care Team: Antonio Meth, Jamee SAUNDERS, DO as PCP - General (Family Medicine) Okey Leader, MD as Consulting Physician (Obstetrics and Gynecology) Robinson Pao, MD as Consulting Physician (Dermatology)  Patient Active Problem List   Diagnosis Date Noted   Moderate obstructive sleep apnea 08/31/2021   Preventative health care 06/16/2021   Acute non-recurrent pansinusitis 04/25/2021   Seasonal allergies 12/15/2020   Rash 01/06/2018   Morbid obesity (HCC) 01/06/2018   Depression 12/31/2016   Allergic reaction 10/29/2016   Acute URI 05/06/2015   Viral URI with cough 11/29/2014   GERD (gastroesophageal reflux disease) 10/25/2014   Anxiety and depression 10/25/2014   Left sided numbness 10/11/2014   Gastroenteritis 06/25/2013   Obesity (BMI 30-39.9) 06/02/2013   Melanoma (HCC) 08/13/2011    Past Medical History:  Diagnosis Date   Anxiety    Chicken pox    Depression    GERD (gastroesophageal reflux disease)    Hypertension    no meds   Melanoma (HCC)    left  buttock   Pneumonia    Sleep apnea    no CPAP   claustrophobic   Urticaria     Past Surgical History:  Procedure Laterality Date   MELANOMA EXCISION  02/06/2011   left buttock and had 2 - inguinal  lymph nodes removed   skin cancer removal  08/14/2011   2 lymph nodes removed as well   TONSILLECTOMY  1972   and adenoidectomy   WRIST SURGERY  1991/1192   ganglion cyst r wrist    Social History   Socioeconomic History   Marital status: Married    Spouse name: Not on file   Number of children: 0   Years of education: Not on file   Highest education level: 12th grade  Occupational History   Occupation: Advertising account planner  Tobacco Use   Smoking status: Never    Passive exposure: Never   Smokeless tobacco: Never  Vaping Use   Vaping status: Never Used  Substance and Sexual Activity   Alcohol use: Yes    Comment: occassionally   Drug use: No   Sexual activity: Yes    Partners: Male  Other Topics Concern   Not on file  Social History Narrative   No exercise   Social Drivers of Health   Financial Resource Strain: Low Risk  (01/31/2023)   Overall Financial Resource Strain (CARDIA)    Difficulty of Paying Living Expenses: Not very hard  Food Insecurity: No Food Insecurity (01/31/2023)   Hunger Vital Sign    Worried About Running Out of Food in the Last Year: Never true    Ran Out  of Food in the Last Year: Never true  Transportation Needs: No Transportation Needs (01/31/2023)   PRAPARE - Administrator, Civil Service (Medical): No    Lack of Transportation (Non-Medical): No  Physical Activity: Insufficiently Active (01/31/2023)   Exercise Vital Sign    Days of Exercise per Week: 3 days    Minutes of Exercise per Session: 10 min  Stress: Stress Concern Present (01/31/2023)   [employer] of Occupational Health - Occupational Stress Questionnaire    Feeling of Stress : To some extent  Social Connections: Unknown (01/31/2023)   Social Connection and  Isolation Panel    Frequency of Communication with Friends and Family: More than three times a week    Frequency of Social Gatherings with Friends and Family: Once a week    Attends Religious Services: Patient declined    Database administrator or Organizations: No    Attends Engineer, structural: Not on file    Marital Status: Married  Catering manager Violence: Not on file    Family History  Problem Relation Age of Onset   Allergic rhinitis Mother    Depression Mother    Kidney Stones Mother    Allergic rhinitis Father    Alzheimer's disease Father    Colon polyps Father        suspicious polyps and 8 in of colon removed ? colon cancer    Depression Father    Hypertension Father    Allergic rhinitis Sister    Allergic rhinitis Sister    Allergic rhinitis Sister    Breast cancer Paternal Aunt    Breast cancer Maternal Grandmother    Colon cancer Neg Hx    Esophageal cancer Neg Hx    Rectal cancer Neg Hx    Stomach cancer Neg Hx     Medications Prior to Admission  Medication Sig Dispense Refill Last Dose/Taking   Ascorbic Acid (VITAMIN C) 1000 MG tablet Take 1,000 mg by mouth in the morning and at bedtime.   08/08/2023   buPROPion  (WELLBUTRIN  XL) 150 MG 24 hr tablet Take 3 tablets (450 mg total) by mouth daily. 270 tablet 0 08/15/2023 at  4:15 AM   cholecalciferol (VITAMIN D) 1000 UNITS tablet Take 1,000 Units by mouth every evening.   08/08/2023   cyanocobalamin (VITAMIN B12) 1000 MCG tablet Take 1,000 mcg by mouth every evening.   08/08/2023   fexofenadine  (ALLEGRA  ALLERGY ) 180 MG tablet Take 1 tablet (180 mg total) by mouth daily.   08/15/2023 at  4:15 AM   fluticasone  (FLONASE ) 50 MCG/ACT nasal spray Place 2 sprays into both nostrils daily. (Patient taking differently: Place 2 sprays into both nostrils daily as needed for allergies.) 16 g 1 Taking Differently   MIMVEY 1-0.5 MG tablet Take 1 tablet by mouth every evening.   08/14/2023   montelukast  (SINGULAIR ) 10 MG tablet  Take 1 tablet (10 mg total) by mouth at bedtime. 90 tablet 0 08/14/2023   pantoprazole  (PROTONIX ) 40 MG tablet TAKE 1 TABLET BY MOUTH DAILY. 90 tablet 1 08/15/2023 at  4:15 AM   valACYclovir (VALTREX) 500 MG tablet Take 500 mg by mouth daily as needed (outbreaks).   Past Month   zinc gluconate 50 MG tablet Take 50 mg by mouth every evening.   08/08/2023   levocetirizine (XYZAL ) 5 MG tablet Take 1 tablet (5 mg total) by mouth every evening. 30 tablet 5 More than a month    Current Facility-Administered Medications  Medication Dose  Route Frequency Provider Last Rate Last Admin   acetaminophen  (TYLENOL ) tablet 1,000 mg  1,000 mg Oral On Call to OR Sheldon Standing, MD       bupivacaine  liposome (EXPAREL ) 1.3 % injection 266 mg  20 mL Infiltration Once Sheldon Standing, MD       ceFAZolin  (ANCEF ) IVPB 2g/100 mL premix  2 g Intravenous On Call to OR Sheldon Standing, MD       celecoxib  (CELEBREX ) capsule 200 mg  200 mg Oral Once Treen, Jon R, MD       Chlorhexidine  Gluconate Cloth 2 % PADS 6 each  6 each Topical Once Sheldon Standing, MD       And   Chlorhexidine  Gluconate Cloth 2 % PADS 6 each  6 each Topical Once Sheldon Standing, MD       NOREEN ON 08/16/2023] feeding supplement (ENSURE PRE-SURGERY) liquid 296 mL  296 mL Oral Once Sheldon Standing, MD       feeding supplement (ENSURE PRE-SURGERY) liquid 592 mL  592 mL Oral Once Sheldon Standing, MD       lactated ringers  infusion   Intravenous Continuous Corinne Garnette BRAVO, MD 10 mL/hr at 08/15/23 0620 New Bag at 08/15/23 0620     Allergies  Allergen Reactions   Hydrocodone  Nausea And Vomiting    BP (!) 141/93   Pulse (!) 103   Temp 99.3 F (37.4 C) (Oral)   Resp 16   Ht 5' 3 (1.6 m)   Wt 88.5 kg   LMP 02/09/2016   SpO2 96%   BMI 34.54 kg/m   Labs: No results found for this or any previous visit (from the past 48 hours).  Imaging / Studies: No results found.   Briant KYM Sheldon, M.D., F.A.C.S. Gastrointestinal and Minimally Invasive Surgery [facility] [address] [place], [place] [address] [phone] Main / Paging  08/15/2023 6:52 AM    Standing JAYSON Sheldon

## 2023-08-15 NOTE — Anesthesia Procedure Notes (Signed)
 Procedure Name: Intubation Date/Time: 08/15/2023 7:28 AM  Performed by: Nada Corean CROME, CRNAPre-anesthesia Checklist: Suction available, Emergency Drugs available, Patient identified, Timeout performed and Patient being monitored Patient Re-evaluated:Patient Re-evaluated prior to induction Oxygen Delivery Method: Circle system utilized Preoxygenation: Pre-oxygenation with 100% oxygen Induction Type: IV induction Ventilation: Mask ventilation without difficulty Laryngoscope Size: 3 Grade View: Grade II Tube type: Oral Tube size: 7.0 mm Number of attempts: 1 Airway Equipment and Method: Stylet Placement Confirmation: ETT inserted through vocal cords under direct vision, positive ETCO2 and breath sounds checked- equal and bilateral Secured at: 22 cm Tube secured with: Tape Dental Injury: Teeth and Oropharynx as per pre-operative assessment  Comments: Intubation performed by paramedic student with MD at bedside.

## 2023-08-15 NOTE — H&P (Signed)
 08/15/2023    PATIENT NAME: Vicki Le MRN: [id number] DOB: 1966/01/06 PHYSICIANS:  REFERRING PHYSICIAN: Saguier, [place] HERO, PA  CARE TEAM:  Patient Care Team: Saguier, [place] HERO, PA as PCP - General (Physician Assistant)  CONSULTING PROVIDER: ELSPETH JUDAH SCHULTZE, MD  SUBJECTIVE   Chief Complaint: New Consultation (spigelian hernia)   Vicki Le is a 58 y.o. female  who is seen today as an office consultation  at the request of PA. Saguier  for evaluation of spigelian hernia and abdominal pain  History of Present Illness:  Pleasant obese woman. No prior history of surgery. Noticed abdominal pain about 2 months ago. Intermittent became more sharp. Right lower side. Discussed with primary care. CAT scan ordered read concern of incarcerated spigelian hernia. Surgical consultation offered.  Does not have any major cardiac or pulmonary issues. Walk 1/2-hour without difficulty. Moves her bowels every day. Does not smoke. No diabetes. She does have sleep apnea but has some issues wearing and maintaining the mask due to claustrophobia and fit issues. History of infections. No history of Crohn's or inflammatory bowel disease.  Medical History:  Past Medical History:  Diagnosis Date  GERD (gastroesophageal reflux disease)  Sleep apnea   Patient Active Problem List  Diagnosis  Irreducible Spigelian hernia  Obesity, Class I, BMI 30.0-34.9 (see actual BMI)   Past Surgical History:  Procedure Laterality Date  melanoma Left 08/14/2011  buttock  TONSILLECTOMY    Allergies  Allergen Reactions  Hydrocodone -Acetaminophen  Other (See Comments) and Nausea And Vomiting  Prednisone  Rash  Heart races   Current Outpatient Medications on File Prior to Visit  Medication Sig Dispense Refill  ASCORBATE CALCIUM, VITAMIN C, ORAL Take 1 tablet by mouth once daily  buPROPion  (WELLBUTRIN  XL) 150 MG XL tablet Take 450 mg by mouth once daily  cyanocobalamin 2000 MCG tablet Take 1 tablet  by mouth once daily  ergocalciferol, vitamin D2, (VITAMIN D2 ORAL) Take by mouth  estradiol-norethindrone (ACTIVELLA) 1-0.5 mg tablet Take 1 tablet by mouth once daily  fexofenadine  (ALLEGRA ) 180 MG tablet Take 180 mg by mouth once daily  levocetirizine (XYZAL ) 5 MG tablet Take 5 mg by mouth every evening  montelukast  (SINGULAIR ) 10 mg tablet Take 10 mg by mouth at bedtime  pantoprazole  (PROTONIX ) 40 MG DR tablet Take 40 mg by mouth once daily  valACYclovir (VALTREX) 500 MG tablet Take 500 mg by mouth once daily  ZINC ACETATE ORAL Take by mouth   No current facility-administered medications on file prior to visit.   Family History  Problem Relation Age of Onset  Skin cancer Mother  Obesity Mother  High blood pressure (Hypertension) Mother  Coronary Artery Disease (Blocked arteries around heart) Mother  Hyperlipidemia (Elevated cholesterol) Father  Coronary Artery Disease (Blocked arteries around heart) Father  Colon cancer Father  Skin cancer Sister  Obesity Sister    Social History   Tobacco Use  Smoking Status Never  Smokeless Tobacco Never    Social History   Socioeconomic History  Marital status: Married  Tobacco Use  Smoking status: Never  Smokeless tobacco: Never  Vaping Use  Vaping status: Never Used  Substance and Sexual Activity  Alcohol use: Yes  Drug use: Never   Social Drivers of Corporate investment banker Strain: Low Risk (01/31/2023)  Received from [facility]  Overall Financial Resource Strain (CARDIA)  Difficulty of Paying Living Expenses: Not very hard  Food Insecurity: No Food Insecurity (01/31/2023)  [facility]  Hunger Vital Sign  Worried  About Running Out of Food in the Last Year: Never true  Ran Out of Food in the Last Year: Never true  Transportation Needs: No Transportation Needs (01/31/2023)  Received from [facility] - Transportation  Lack of Transportation (Medical): No  Lack of Transportation  (Non-Medical): No  Physical Activity: Insufficiently Active (01/31/2023)  Received from [facility]  Exercise Vital Sign  Days of Exercise per Week: 3 days  Minutes of Exercise per Session: 10 min  Stress: Stress Concern Present (01/31/2023)  Received from [facility] of Occupational Health - Occupational Stress Questionnaire  Feeling of Stress : To some extent  Social Connections: Unknown (01/31/2023)  Received from [facility]  Social Connection and Isolation Panel [NHANES]  Frequency of Communication with Friends and Family: More than three times a week  Frequency of Social Gatherings with Friends and Family: Once a week  Attends Religious Services: Patient declined  Database administrator or Organizations: No  Marital Status: Married  Housing Stability: Unknown (07/08/2023)  Housing Stability Vital Sign  Homeless in the Last Year: No   ############################################################  Review of Systems: A complete review of systems (ROS) was obtained from the patient.  We have reviewed this information and discussed as appropriate with the patient.  See HPI as well for other pertinent ROS.  Constitutional: No fevers, chills, sweats. Weight stable Eyes: No vision changes, No discharge HENT: No sore throats, nasal drainage Lymph: No neck swelling, No bruising easily Pulmonary: No cough, productive sputum CV: No orthopnea, PND . No exertional chest/neck/shoulder/arm pain. Patient can walk 30 minutes without difficulty.   GI: No personal nor family history of GI/colon cancer, inflammatory bowel disease, irritable bowel syndrome, allergy  such as Celiac Sprue, dietary/dairy problems, colitis, ulcers nor gastritis. No recent sick contacts/gastroenteritis. No travel outside the country. No changes in diet.  Renal: No UTIs, No hematuria Genital: No drainage, bleeding, masses Musculoskeletal: No severe joint pain. Good ROM major joints Skin: No sores or  lesions Heme/Lymph: No easy bleeding. No swollen lymph nodes Neuro: No active seizures. No facial droop Psych: No hallucinations. No agitation  OBJECTIVE   Vitals:  07/08/23 0832 07/08/23 0833  BP: (!) 166/101  Pulse: 96  Temp: 36.8 C (98.3 F)  SpO2: 98%  Weight: 90.1 kg (198 lb 9.6 oz)  Height: 162.6 cm (5' 4)  PainSc: 0-No pain  PainLoc: Abdomen   Body mass index is 34.09 kg/m.  PHYSICAL EXAM:  Constitutional: Not cachectic. Hygeine adequate. Vitals signs as above.  Eyes: Wears glasses - vision corrected,Pupils reactive, normal extraocular movements. Sclera nonicteric Neuro: CN II-XII intact. No major focal sensory defects. No major motor deficits. Lymph: No head/neck/groin lymphadenopathy Psych: No severe agitation. No severe anxiety. Judgment & insight Adequate, Oriented x4, HENT: Normocephalic, Mucus membranes moist. No thrush. Hearing: adequate Neck: Supple, No tracheal deviation. No obvious thyromegaly Chest: No pain to chest wall compression. Good respiratory excursion. No audible wheezing CV: Pulses intact. regular. No major extremity edema Ext: No obvious deformity or contracture. Edema: Not present. No cyanosis Skin: No major subcutaneous nodules. Warm and dry Musculoskeletal: Severe joint rigidity not present. No obvious clubbing. No digital petechiae. Mobility: no assist device moving easily without restrictions  Abdomen: Obese Soft. Nondistended. Tenderness at right lower quadrant with maybe subtle fullness raising suspicion of incarcerated spigelian hernia.  Diastasis recti: Not present. No hepatomegaly. No splenomegaly.  Genital/Pelvic: Inguinal hernia: Not present. Inguinal lymph nodes: without lymphadenopathy nor hidradenitis.   Rectal: (Deferred)    ###################################################################  Labs, Imaging and Diagnostic Testing:  Located in 'Care Everywhere' section of Epic EMR chart  PRIOR [facility] CLINIC NOTES:  Located  in 'Care Everywhere' section of Epic EMR chart  SURGERY NOTES:  Located in 'Care Everywhere' section of Epic EMR chart  PATHOLOGY:  Located in 'Care Everywhere' section of Epic EMR chart  Assessment and Plan:  DIAGNOSES:  Diagnoses and all orders for this visit:  Irreducible Spigelian hernia  Obesity, Class I, BMI 30.0-34.9 (see actual BMI)    ASSESSMENT/PLAN  Pleasant obese woman with sharp right lower quadrant pain with increasing frequency and intensity with CAT scan showing fat-containing hernia. Favor spigelian given being superior and inguinal region clear.  Standard care is to consider repair. Good candidate for a minimally invasive approach. Robotic TAPP approach with mesh. Hopefully outpatient surgery  The anatomy & physiology of the abdominal wall was discussed. The pathophysiology of hernias was discussed. Natural history risks without surgery including progeressive enlargement, pain, incarceration, & strangulation was discussed. Contributors to complications such as smoking, obesity, diabetes, prior surgery, etc were discussed.   I feel the risks of no intervention will lead to serious problems that outweigh the operative risks; therefore, I recommended surgery to reduce and repair the hernia. I explained laparoscopic techniques with possible need for an open approach. I noted the probable use of mesh to patch and/or buttress the hernia repair  Risks such as bleeding, infection, abscess, need for further treatment, injury to other organs, need for repair of tissues / organs, stroke, heart attack, death, and other risks were discussed. I noted a good likelihood this will help address the problem. Goals of post-operative recovery were discussed as well. Possibility that this will not correct all symptoms was explained. I stressed the importance of low-impact activity, aggressive pain control, avoiding constipation, & not pushing through pain to minimize risk of post-operative  chronic pain or injury. Possibility of reherniation especially with smoking, obesity, diabetes, immunosuppression, and other health conditions was discussed. We will work to minimize complications.   An educational handout further explaining the pathology & treatment options was given as well. Questions were answered. The patient expresses understanding & wishes to proceed with surgery.  Elspeth KYM Schultze, MD, FACS, MASCRS Esophageal, Gastrointestinal & Colorectal Surgery Robotic and Minimally Invasive Surgery  [facility] [address] [place], [place] [address] [phone] Fax [phone] Main  CONTACT INFORMATION: Weekday (9AM-5PM): Call [facility] main office at [phone] Weeknight (5PM-9AM) or Weekend/Holiday: Check EPIC Web Links tab & use AMION (password  [email or web address]) for General Surgery [facility] coverage  Please, DO NOT use SecureChat  (it is not reliable communication to reach operating surgeons & will lead to a delay in care).   Epic staff messaging available for outptient concerns needing 1-2 business day response.     08/15/2023

## 2023-08-15 NOTE — Anesthesia Postprocedure Evaluation (Signed)
 Anesthesia Post Note  Patient: Vicki Le  Procedure(s) Performed: REPAIR, HERNIA, VENTRAL, ROBOT-ASSISTED (Abdomen)     Patient location during evaluation: PACU Anesthesia Type: General Level of consciousness: awake and alert Pain management: pain level controlled Vital Signs Assessment: post-procedure vital signs reviewed and stable Respiratory status: spontaneous breathing, nonlabored ventilation, respiratory function stable and patient connected to nasal cannula oxygen Cardiovascular status: blood pressure returned to baseline and stable Postop Assessment: no apparent nausea or vomiting Anesthetic complications: no   No notable events documented.  Last Vitals:  Vitals:   08/15/23 1115 08/15/23 1145  BP: 109/73 109/80  Pulse: 88   Resp: 17 18  Temp:    SpO2: 92%     Last Pain:  Vitals:   08/15/23 1145  TempSrc:   PainSc: 8                  Vicki Le

## 2023-08-16 ENCOUNTER — Encounter (HOSPITAL_COMMUNITY): Payer: Self-pay | Admitting: Surgery

## 2023-09-24 ENCOUNTER — Encounter: Payer: Self-pay | Admitting: Medical

## 2023-09-24 ENCOUNTER — Ambulatory Visit: Admitting: Medical

## 2023-09-24 VITALS — BP 140/80 | HR 100 | Temp 99.1°F | Resp 18 | Ht 63.0 in | Wt 199.6 lb

## 2023-09-24 DIAGNOSIS — J3489 Other specified disorders of nose and nasal sinuses: Secondary | ICD-10-CM

## 2023-09-24 DIAGNOSIS — R059 Cough, unspecified: Secondary | ICD-10-CM

## 2023-09-24 DIAGNOSIS — J320 Chronic maxillary sinusitis: Secondary | ICD-10-CM

## 2023-09-24 LAB — POC COVID19 BINAXNOW: SARS Coronavirus 2 Ag: NEGATIVE

## 2023-09-24 MED ORDER — FLUCONAZOLE 150 MG PO TABS
ORAL_TABLET | ORAL | 0 refills | Status: DC
Start: 2023-09-24 — End: 2023-11-01

## 2023-09-24 MED ORDER — AMOXICILLIN-POT CLAVULANATE 875-125 MG PO TABS: 1.0000 | ORAL_TABLET | Freq: Two times a day (BID) | ORAL | 0 refills | Status: DC

## 2023-09-24 NOTE — Progress Notes (Unsigned)
 Subjective:    Patient ID: Vicki Le, female    DOB: 03-Jul-1965, 58 y.o.   MRN: [id number]  HPI   Vicki Le is a 57 year old female who presents with sinus congestion and cough.  She has been experiencing sinus congestion and a cough for approximately two and a half days. She describes a sensation of head pressure, likening it to feeling like her 'head's in a vice.' The cough is persistent and disrupts her sleep. She has been using Tessalon  Perles, which provide some relief but are not significantly effective. The cough is productive, with sputum that has a yellow tinge, though not green, and she experiences pain when coughing.  She denies having chills but mentions experiencing hot flashes, which she attributes to postmenopausal symptoms. She has year-round allergies and takes Xyzal  at night and Allegra  in the morning, along with Flonase . She reports pressure around her eyes, which makes it painful to open them. She has a history of sinus infections that occur intermittently.  She recently underwent surgery for three hernias, which involved stitching fascia tissue and placing mesh. She reports that the surgery involved twisted ligaments and other issues that required repair. Post-surgery, she slept on a couch with a wedge pillow for over a week due to discomfort. She has been active post-surgery, though she avoids lifting heavy objects.  Her blood pressure has been borderline high, with readings around 140/80. She does not take any blood pressure medications but has a cuff at home to monitor it. She follows a low salt diet and has been active, though her husband believes she might be overdoing it.6      Review of Systems  Constitutional:  Negative for chills and fatigue.  HENT:  Positive for congestion, sinus pressure and sinus pain. Negative for sore throat.   Gastrointestinal:  Negative for abdominal pain and nausea.  Genitourinary:  Negative for dysuria.  Musculoskeletal:   Negative for back pain and joint swelling.  Neurological:  Negative for speech difficulty, weakness and headaches.  Hematological:  Negative for adenopathy. Does not bruise/bleed easily.  Psychiatric/Behavioral:  Negative for behavioral problems and decreased concentration.     Past Medical History:  Diagnosis Date   Anxiety    Chicken pox    Depression    GERD (gastroesophageal reflux disease)    Hypertension    no meds   Melanoma (HCC)    left buttock   Pneumonia    Sleep apnea    no CPAP   claustrophobic   Urticaria      Social History   Socioeconomic History   Marital status: Married    Spouse name: Not on file   Number of children: 0   Years of education: Not on file   Highest education level: 12th grade  Occupational History   Occupation: Advertising account planner  Tobacco Use   Smoking status: Never    Passive exposure: Never   Smokeless tobacco: Never  Vaping Use   Vaping status: Never Used  Substance and Sexual Activity   Alcohol use: Yes    Comment: occassionally   Drug use: No   Sexual activity: Yes    Partners: Male  Other Topics Concern   Not on file  Social History Narrative   No exercise   Social Drivers of Health   Financial Resource Strain: Low Risk  (01/31/2023)   Overall Financial Resource Strain (CARDIA)    Difficulty of Paying Living Expenses: Not very hard  Food Insecurity: No  Food Insecurity (01/31/2023)   Hunger Vital Sign    Worried About Running Out of Food in the Last Year: Never true    Ran Out of Food in the Last Year: Never true  Transportation Needs: No Transportation Needs (01/31/2023)   PRAPARE - Administrator, Civil Service (Medical): No    Lack of Transportation (Non-Medical): No  Physical Activity: Insufficiently Active (01/31/2023)   Exercise Vital Sign    Days of Exercise per Week: 3 days    Minutes of Exercise per Session: 10 min  Stress: Stress Concern Present (01/31/2023)   [employer] of Occupational  Health - Occupational Stress Questionnaire    Feeling of Stress : To some extent  Social Connections: Unknown (01/31/2023)   Social Connection and Isolation Panel    Frequency of Communication with Friends and Family: More than three times a week    Frequency of Social Gatherings with Friends and Family: Once a week    Attends Religious Services: Patient declined    Database administrator or Organizations: No    Attends Engineer, structural: Not on file    Marital Status: Married  Catering manager Violence: Not on file    Past Surgical History:  Procedure Laterality Date   MELANOMA EXCISION  02/06/2011   left buttock and had 2 - inguinal  lymph nodes removed   skin cancer removal  08/14/2011   2 lymph nodes removed as well   TONSILLECTOMY  1972   and adenoidectomy   WRIST SURGERY  1991/1192   ganglion cyst r wrist   XI ROBOTIC ASSISTED VENTRAL HERNIA N/A 08/15/2023   Procedure: REPAIR, HERNIA, VENTRAL, ROBOT-ASSISTED;  Surgeon: Sheldon Standing, MD;  Location: [facility];  Service: General;  Laterality: N/A;    Family History  Problem Relation Age of Onset   Allergic rhinitis Mother    Depression Mother    Kidney Stones Mother    Allergic rhinitis Father    Alzheimer's disease Father    Colon polyps Father        suspicious polyps and 8 in of colon removed ? colon cancer    Depression Father    Hypertension Father    Allergic rhinitis Sister    Allergic rhinitis Sister    Allergic rhinitis Sister    Breast cancer Paternal Aunt    Breast cancer Maternal Grandmother    Colon cancer Neg Hx    Esophageal cancer Neg Hx    Rectal cancer Neg Hx    Stomach cancer Neg Hx     Allergies  Allergen Reactions   Hydrocodone  Nausea And Vomiting    Current Outpatient Medications on File Prior to Visit  Medication Sig Dispense Refill   Ascorbic Acid (VITAMIN C) 1000 MG tablet Take 1,000 mg by mouth in the morning and at bedtime.     buPROPion  (WELLBUTRIN  XL) 150 MG 24 hr  tablet Take 3 tablets (450 mg total) by mouth daily. 270 tablet 0   cholecalciferol (VITAMIN D) 1000 UNITS tablet Take 1,000 Units by mouth every evening.     cyanocobalamin (VITAMIN B12) 1000 MCG tablet Take 1,000 mcg by mouth every evening.     fexofenadine  (ALLEGRA  ALLERGY ) 180 MG tablet Take 1 tablet (180 mg total) by mouth daily.     fluticasone  (FLONASE ) 50 MCG/ACT nasal spray Place 2 sprays into both nostrils daily. (Patient taking differently: Place 2 sprays into both nostrils daily as needed for allergies.) 16 g 1   levocetirizine (XYZAL )  5 MG tablet Take 1 tablet (5 mg total) by mouth every evening. 30 tablet 5   methocarbamol  (ROBAXIN ) 500 MG tablet Take 1 tablet (500 mg total) by mouth every 8 (eight) hours as needed for muscle spasms. 20 tablet 1   MIMVEY 1-0.5 MG tablet Take 1 tablet by mouth every evening.     montelukast  (SINGULAIR ) 10 MG tablet Take 1 tablet (10 mg total) by mouth at bedtime. 90 tablet 0   pantoprazole  (PROTONIX ) 40 MG tablet TAKE 1 TABLET BY MOUTH DAILY. 90 tablet 1   traMADol  (ULTRAM ) 50 MG tablet Take 1-2 tablets (50-100 mg total) by mouth every 6 (six) hours as needed for moderate pain (pain score 4-6) or severe pain (pain score 7-10). 20 tablet 0   valACYclovir (VALTREX) 500 MG tablet Take 500 mg by mouth daily as needed (outbreaks).     zinc gluconate 50 MG tablet Take 50 mg by mouth every evening.     No current facility-administered medications on file prior to visit.    BP (!) 156/88   Pulse 100   Temp 99.1 F (37.3 C)   Resp 18   Ht 5' 3 (1.6 m)   Wt 199 lb 9.6 oz (90.5 kg)   LMP 02/09/2016   SpO2 99%   BMI 35.36 kg/m          Objective:   Physical Exam  General Mental Status- Alert. General Appearance- Not in acute distress.   Skin General: Color- Normal Color. Moisture- Normal Moisture.  Neck Carotid Arteries- Normal color. Moisture- Normal Moisture. No carotid bruits. No JVD.  Chest and Lung Exam Auscultation: Breath  Sounds:-Normal.  Cardiovascular Auscultation:Rythm- Regular. Murmurs & Other Heart Sounds:Auscultation of the heart reveals- No Murmurs.  Abdomen Inspection:-Inspeection Normal. Palpation/Percussion:Note:No mass. Palpation and Percussion of the abdomen reveal- Non Tender, Non Distended + BS, no rebound or guarding.    Neurologic Cranial Nerve exam:- CN III-XII intact(No nystagmus), symmetric smile. Drift Test:- No drift. Romberg Exam:- Negative.  Heal to Toe Gait exam:-Normal. Finger to Nose:- Normal/Intact Strength:- 5/5 equal and symmetric strength both upper and lower extremities.       Assessment & Plan:   Patient Instructions  Acute sinusitis with allergic rhinitis Acute sinusitis with perennial allergies, exacerbated by seasonal changes and history of sinus infections. Negative COVID test. Differential includes sinus infection and allergy  component. - Prescribe Augmentin  for 10 days. - Continue Flonase  and Allegra . - Consider steroid taper if allergy  symptoms worsen. - Consider injection antibiotic if severe pain persists.  Borderline hypertension Slightly elevated blood pressure with history of elevated readings. Discussed lifestyle modifications and potential need for medication if blood pressure remains elevated. - Monitor blood pressure at home 3-4 times a week. - Follow proper technique for blood pressure measurement. - Update provider with blood pressure readings in 2 weeks. - Consider starting losartan if blood pressure remains elevated.  Status post multiple hernia repairs Recent multiple hernia repairs with complications. - Avoid overexertion and follow post-operative guidelines.  Postmenopausal state Experiencing postmenopausal symptoms such as hot flashes, complicating fever and chills assessment.  Primary care transition Desire to switch primary care provider due to scheduling difficulties. - Submit request at front desk to initiate primary care  transition.  Follow up wellness exam feb 2026 or sooner if needed    [employer], PA-C

## 2023-09-24 NOTE — Patient Instructions (Signed)
 Acute sinusitis with allergic rhinitis Acute sinusitis with perennial allergies, exacerbated by seasonal changes and history of sinus infections. Negative COVID test. Differential includes sinus infection and allergy  component. - Prescribe Augmentin  for 10 days. - Continue Flonase  and Allegra . - Consider steroid taper if allergy  symptoms worsen. - Consider injection antibiotic if severe pain persists.  Borderline hypertension Slightly elevated blood pressure with history of elevated readings. Discussed lifestyle modifications and potential need for medication if blood pressure remains elevated. - Monitor blood pressure at home 3-4 times a week. - Follow proper technique for blood pressure measurement. - Update provider with blood pressure readings in 2 weeks. - Consider starting losartan if blood pressure remains elevated.  Status post multiple hernia repairs Recent multiple hernia repairs with complications. - Avoid overexertion and follow post-operative guidelines.  Postmenopausal state Experiencing postmenopausal symptoms such as hot flashes, complicating fever and chills assessment.  Primary care transition Desire to switch primary care provider due to scheduling difficulties. - Submit request at front desk to initiate primary care transition.  Follow up wellness exam feb 2026 or sooner if needed

## 2023-09-26 ENCOUNTER — Encounter: Payer: Self-pay | Admitting: Medical

## 2023-09-28 MED ORDER — METHYLPREDNISOLONE 4 MG PO TBPK: ORAL_TABLET | ORAL | 0 refills | Status: DC

## 2023-09-28 NOTE — Addendum Note (Signed)
 Addended by: DORINA DALLAS HERO on: 09/28/2023 09:16 AM   Modules accepted: Orders

## 2023-09-30 MED ORDER — BENZONATATE 100 MG PO CAPS: 100.0000 mg | ORAL_CAPSULE | Freq: Three times a day (TID) | ORAL | 0 refills | Status: AC | PRN

## 2023-09-30 NOTE — Addendum Note (Signed)
 Addended by: DORINA DALLAS HERO on: 09/30/2023 08:28 AM   Modules accepted: Orders

## 2023-10-01 ENCOUNTER — Encounter: Payer: Self-pay | Admitting: Medical

## 2023-10-01 NOTE — Addendum Note (Signed)
 Addended by: DORINA DALLAS HERO on: 10/01/2023 05:08 PM   Modules accepted: Orders

## 2023-10-02 ENCOUNTER — Ambulatory Visit (HOSPITAL_BASED_OUTPATIENT_CLINIC_OR_DEPARTMENT_OTHER)
Admission: RE | Admit: 2023-10-02 | Discharge: 2023-10-02 | Disposition: A | Discharge status: Home / Self Care | Source: Ambulatory Visit | Attending: Medical | Admitting: Medical

## 2023-10-02 ENCOUNTER — Ambulatory Visit: Payer: Self-pay | Admitting: Medical

## 2023-10-02 DIAGNOSIS — R059 Cough, unspecified: Secondary | ICD-10-CM | POA: Insufficient documentation

## 2023-10-03 MED ORDER — GUAIFENESIN-CODEINE 100-10 MG/5ML PO SOLN: ORAL | 0 refills | Status: DC

## 2023-10-03 NOTE — Addendum Note (Signed)
 Addended by: DORINA DALLAS HERO on: 10/03/2023 02:30 PM   Modules accepted: Orders

## 2023-10-03 NOTE — Telephone Encounter (Signed)
 Called pt nurse visit scheduled for tomorrow

## 2023-10-04 ENCOUNTER — Ambulatory Visit (INDEPENDENT_AMBULATORY_CARE_PROVIDER_SITE_OTHER): Admitting: *Deleted

## 2023-10-04 DIAGNOSIS — R059 Cough, unspecified: Secondary | ICD-10-CM

## 2023-10-04 MED ORDER — METHYLPREDNISOLONE ACETATE 40 MG/ML IJ SUSP
40.0000 mg | Freq: Once | INTRAMUSCULAR | Status: AC
Start: 2023-10-04 — End: 2023-10-04
  Administered 2023-10-04: 40 mg via INTRAMUSCULAR

## 2023-10-04 NOTE — Progress Notes (Signed)
 Pt here per order of Dallas Maxwell, PA-C on 10/02/23 for one dose of Depo Medrol  40mg  / mL for persistent cough.  Depo Medrol  40mg , 1mL given IM, left deltoid and patient tolerated injection well.  Pt will follow up with Dallas as scheduled in November or earlier if symptoms worsen or do not resolve.

## 2023-10-10 ENCOUNTER — Ambulatory Visit (HOSPITAL_BASED_OUTPATIENT_CLINIC_OR_DEPARTMENT_OTHER)
Admission: RE | Admit: 2023-10-10 | Discharge: 2023-10-10 | Disposition: A | Discharge status: Home / Self Care | Source: Ambulatory Visit | Attending: Medical | Admitting: Medical

## 2023-10-10 ENCOUNTER — Ambulatory Visit: Admitting: Medical

## 2023-10-10 ENCOUNTER — Encounter: Payer: Self-pay | Admitting: Medical

## 2023-10-10 ENCOUNTER — Ambulatory Visit: Payer: Self-pay | Admitting: Medical

## 2023-10-10 VITALS — BP 124/86 | HR 96 | Temp 98.2°F | Resp 16 | Ht 63.0 in | Wt 195.2 lb

## 2023-10-10 DIAGNOSIS — R0989 Other specified symptoms and signs involving the circulatory and respiratory systems: Secondary | ICD-10-CM

## 2023-10-10 DIAGNOSIS — R059 Cough, unspecified: Secondary | ICD-10-CM

## 2023-10-10 DIAGNOSIS — R0981 Nasal congestion: Secondary | ICD-10-CM | POA: Diagnosis not present

## 2023-10-10 MED ORDER — BUDESONIDE-FORMOTEROL FUMARATE 160-4.5 MCG/ACT IN AERO: 2.0000 | INHALATION_SPRAY | Freq: Two times a day (BID) | RESPIRATORY_TRACT | 3 refills | Status: DC

## 2023-10-10 MED ORDER — AZELASTINE HCL 0.1 % NA SOLN: 2.0000 | Freq: Two times a day (BID) | NASAL | 0 refills | Status: DC

## 2023-10-10 MED ORDER — FLUTICASONE-SALMETEROL 250-50 MCG/ACT IN AEPB: 1.0000 | INHALATION_SPRAY | Freq: Two times a day (BID) | RESPIRATORY_TRACT | 0 refills | Status: DC

## 2023-10-10 NOTE — Addendum Note (Signed)
 Addended by: DORINA DALLAS HERO on: 10/10/2023 11:11 AM   Modules accepted: Orders

## 2023-10-10 NOTE — Patient Instructions (Addendum)
   Chronic cough with congestion, nasal congestion and mucus production Chronic productive cough with occasional green mucus, chest congestion and sinus congestion, Differential includes reactive airways, asthma, allergies, pneumonia, and bronchitis. Negative chest x-ray. Reactive airways likely due to past viral infection and airway sensitivity. Symbicort  inhaler proposed due to past positive response. - Prescribe Symbicort  inhaler, two puffs twice daily. - Continue Flonase  nasal spray. - Add Astelin  nasal spray, two sprays twice daily. - Continue Robitussin AC as needed for cough, primarily at night. - Continue Mucinex  during the day. - Order chest x-ray to rule out infection. - Refer to pulmonology for further evaluation. - Instruct her to follow up with pulmonology in 3-5 days if no contact is made. - Update on chest x-ray results and pulmonology appointment.

## 2023-10-10 NOTE — Progress Notes (Signed)
 Subjective:    Patient ID: Vicki Le, female    DOB: 1965/07/05, 58 y.o.   MRN: [id number]  HPI  Vicki Le is a 58 year old female who presents with persistent cough and congestion.  She has been experiencing a persistent cough and congestion since September 22, 2023. The cough is sometimes productive, bringing up green mucus, particularly in the morning. It occurs randomly during the day but is controlled at night with Robitussin AC, allowing her to sleep. Her voice is not normal and she occasionally feels unable to take a full deep breath. No wheezing is heard.  She has daily sinus irritation and congestion. Initial treatment included Augmentin , Flonase , and Allegra , followed by a Medrol  taper. A Depo Medrol  injection on the previous Friday provided some improvement. She continues to use Flonase  and takes Mucinex  during the day.  A chest x-ray on October 02, 2023, was negative. She has no history of smoking, asthma, or secondhand smoke exposure.  She recalls using Symbicort  in the past during a COVID-19 infection in 2020, as she was considered a 'long hauler' with persistent upper respiratory symptoms.         Review of Systems  Constitutional:  Negative for chills, fatigue and fever.  HENT:  Positive for congestion. Negative for hearing loss, postnasal drip and sinus pain.        Faint sinus pressure.  Respiratory:  Positive for cough. Negative for chest tightness and wheezing.   Cardiovascular:  Negative for chest pain and palpitations.  Gastrointestinal:  Negative for abdominal pain, blood in stool, nausea and vomiting.  Musculoskeletal:  Negative for back pain, myalgias and neck stiffness.  Skin:  Negative for rash.  Neurological:  Negative for dizziness, syncope, weakness and light-headedness.  Hematological:  Negative for adenopathy. Does not bruise/bleed easily.  Psychiatric/Behavioral:  Negative for behavioral problems and decreased concentration.     Past  Medical History:  Diagnosis Date   Anxiety    Chicken pox    Depression    GERD (gastroesophageal reflux disease)    Hypertension    no meds   Melanoma (HCC)    left buttock   Pneumonia    Sleep apnea    no CPAP   claustrophobic   Urticaria      Social History   Socioeconomic History   Marital status: Married    Spouse name: Not on file   Number of children: 0   Years of education: Not on file   Highest education level: 12th grade  Occupational History   Occupation: Advertising account planner  Tobacco Use   Smoking status: Never    Passive exposure: Never   Smokeless tobacco: Never  Vaping Use   Vaping status: Never Used  Substance and Sexual Activity   Alcohol use: Yes    Comment: occassionally   Drug use: No   Sexual activity: Yes    Partners: Male  Other Topics Concern   Not on file  Social History Narrative   No exercise   Social Drivers of Health   Financial Resource Strain: Low Risk  (10/09/2023)   Overall Financial Resource Strain (CARDIA)    Difficulty of Paying Living Expenses: Not hard at all  Food Insecurity: No Food Insecurity (10/09/2023)   Hunger Vital Sign    Worried About Running Out of Food in the Last Year: Never true    Ran Out of Food in the Last Year: Never true  Transportation Needs: No Transportation Needs (10/09/2023)  PRAPARE - Administrator, Civil Service (Medical): No    Lack of Transportation (Non-Medical): No  Physical Activity: Insufficiently Active (10/09/2023)   Exercise Vital Sign    Days of Exercise per Week: 2 days    Minutes of Exercise per Session: 30 min  Stress: No Stress Concern Present (10/09/2023)   [employer] of Occupational Health - Occupational Stress Questionnaire    Feeling of Stress: Only a little  Social Connections: Moderately Integrated (10/09/2023)   Social Connection and Isolation Panel    Frequency of Communication with Friends and Family: More than three times a week    Frequency of Social  Gatherings with Friends and Family: Twice a week    Attends Religious Services: 1 to 4 times per year    Active Member of [employer] or Organizations: No    Attends Engineer, structural: Not on file    Marital Status: Married  Catering manager Violence: Not on file    Past Surgical History:  Procedure Laterality Date   MELANOMA EXCISION  02/06/2011   left buttock and had 2 - inguinal  lymph nodes removed   skin cancer removal  08/14/2011   2 lymph nodes removed as well   TONSILLECTOMY  1972   and adenoidectomy   WRIST SURGERY  1991/1192   ganglion cyst r wrist   XI ROBOTIC ASSISTED VENTRAL HERNIA N/A 08/15/2023   Procedure: REPAIR, HERNIA, VENTRAL, ROBOT-ASSISTED;  Surgeon: Sheldon Standing, MD;  Location: [facility];  Service: General;  Laterality: N/A;    Family History  Problem Relation Age of Onset   Allergic rhinitis Mother    Depression Mother    Kidney Stones Mother    Allergic rhinitis Father    Alzheimer's disease Father    Colon polyps Father        suspicious polyps and 8 in of colon removed ? colon cancer    Depression Father    Hypertension Father    Allergic rhinitis Sister    Allergic rhinitis Sister    Allergic rhinitis Sister    Breast cancer Paternal Aunt    Breast cancer Maternal Grandmother    Colon cancer Neg Hx    Esophageal cancer Neg Hx    Rectal cancer Neg Hx    Stomach cancer Neg Hx     Allergies  Allergen Reactions   Hydrocodone  Nausea And Vomiting    Current Outpatient Medications on File Prior to Visit  Medication Sig Dispense Refill   amoxicillin -clavulanate (AUGMENTIN ) 875-125 MG tablet Take 1 tablet by mouth 2 (two) times daily. 20 tablet 0   Ascorbic Acid (VITAMIN C) 1000 MG tablet Take 1,000 mg by mouth in the morning and at bedtime.     benzonatate  (TESSALON ) 100 MG capsule Take 1 capsule (100 mg total) by mouth 3 (three) times daily as needed for cough. 30 capsule 0   buPROPion  (WELLBUTRIN  XL) 150 MG 24 hr tablet Take 3 tablets  (450 mg total) by mouth daily. 270 tablet 0   cholecalciferol (VITAMIN D) 1000 UNITS tablet Take 1,000 Units by mouth every evening.     cyanocobalamin (VITAMIN B12) 1000 MCG tablet Take 1,000 mcg by mouth every evening.     fexofenadine  (ALLEGRA  ALLERGY ) 180 MG tablet Take 1 tablet (180 mg total) by mouth daily.     fluconazole  (DIFLUCAN ) 150 MG tablet 1 tab at onset yeast infection. Repeat in 5 days if needed 2 tablet 0   fluticasone  (FLONASE ) 50 MCG/ACT nasal spray Place  2 sprays into both nostrils daily. (Patient taking differently: Place 2 sprays into both nostrils daily as needed for allergies.) 16 g 1   guaiFENesin -codeine  100-10 MG/5ML syrup 5ml po every 6 hours prn cough 120 mL 0   levocetirizine (XYZAL ) 5 MG tablet Take 1 tablet (5 mg total) by mouth every evening. 30 tablet 5   methylPREDNISolone  (MEDROL  DOSEPAK) 4 MG TBPK tablet Standard 6 day taper dose pack 21 tablet 0   MIMVEY 1-0.5 MG tablet Take 1 tablet by mouth every evening.     montelukast  (SINGULAIR ) 10 MG tablet Take 1 tablet (10 mg total) by mouth at bedtime. 90 tablet 0   pantoprazole  (PROTONIX ) 40 MG tablet TAKE 1 TABLET BY MOUTH DAILY. 90 tablet 1   valACYclovir (VALTREX) 500 MG tablet Take 500 mg by mouth daily as needed (outbreaks).     zinc gluconate 50 MG tablet Take 50 mg by mouth every evening.     No current facility-administered medications on file prior to visit.    BP 124/86   Pulse 96   Temp 98.2 F (36.8 C) (Oral)   Resp 16   Ht 5' 3 (1.6 m)   Wt 195 lb 3.2 oz (88.5 kg)   LMP 02/09/2016   SpO2 99%   BMI 34.58 kg/m        Objective:   Physical Exam  General Mental Status- Alert. General Appearance- Not in acute distress.   Skin General: Color- Normal Color. Moisture- Normal Moisture.  Neck Carotid Arteries- Normal color. Moisture- Normal Moisture. No carotid bruits. No JVD.  Chest and Lung Exam Auscultation: Breath Sounds: CTA  Cardiovascular Auscultation:Rythm- RRR Murmurs &  Other Heart Sounds:Auscultation of the heart reveals- No Murmurs.  Abdomen Inspection:-Inspeection Normal. Palpation/Percussion:Note:No mass. Palpation and Percussion of the abdomen reveal- Non Tender, Non Distended + BS, no rebound or guarding.   Neurologic Cranial Nerve exam:- CN III-XII intact(No nystagmus), symmetric smile. Strength:- 5/5 equal and symmetric strength both upper and lower extremities.       Assessment & Plan:   Patient Instructions    Chronic cough with congestion, nasal congestion and mucus production Chronic productive cough with occasional green mucus, chest congestion and sinus congestion, Differential includes reactive airways, asthma, allergies, pneumonia, and bronchitis. Negative chest x-ray. Reactive airways likely due to past viral infection and airway sensitivity. Symbicort  inhaler proposed due to past positive response. - Prescribe Symbicort  inhaler, two puffs twice daily. - Continue Flonase  nasal spray. - Add Astelin  nasal spray, two sprays twice daily. - Continue Robitussin AC as needed for cough, primarily at night. - Continue Mucinex  during the day. - Order chest x-ray to rule out infection. - Refer to pulmonology for further evaluation. - Instruct her to follow up with pulmonology in 3-5 days if no contact is made. - Update on chest x-ray results and pulmonology appointment.   Ancil Dewan, PA-C

## 2023-10-14 ENCOUNTER — Other Ambulatory Visit: Payer: Self-pay | Admitting: Family Medicine

## 2023-10-14 DIAGNOSIS — F32A Depression, unspecified: Secondary | ICD-10-CM

## 2023-10-15 ENCOUNTER — Other Ambulatory Visit (HOSPITAL_COMMUNITY): Payer: Self-pay

## 2023-10-15 ENCOUNTER — Telehealth: Payer: Self-pay

## 2023-10-15 NOTE — Telephone Encounter (Signed)
 Pharmacy Patient Advocate Encounter   Received notification from RX Request Messages that prior authorization for Bupropion  150mg  XL tabs is required/requested.   Insurance verification completed.   The patient is insured through [employer] .   Per test claim: PA required; PA submitted to above mentioned insurance via Latent Key/confirmation #/EOC [id number] Status is pending

## 2023-10-16 ENCOUNTER — Other Ambulatory Visit (HOSPITAL_COMMUNITY): Payer: Self-pay

## 2023-10-16 NOTE — Telephone Encounter (Signed)
 Pharmacy Patient Advocate Encounter  Received notification from [employer] that Prior Authorization for Bupropion  150mg  XR tabs has been APPROVED from 09/15/23 to 10/14/24   PA #/Case ID/Reference #: [id number]

## 2023-10-21 ENCOUNTER — Encounter: Payer: Self-pay | Admitting: Medical

## 2023-10-22 LAB — HM MAMMOGRAPHY

## 2023-10-23 MED ORDER — FLUTICASONE-SALMETEROL 100-50 MCG/ACT IN AEPB
1.0000 | INHALATION_SPRAY | Freq: Two times a day (BID) | RESPIRATORY_TRACT | 3 refills | Status: DC
Start: 2023-10-23 — End: 2023-11-01

## 2023-10-23 NOTE — Addendum Note (Signed)
 Addended by: DORINA DALLAS HERO on: 10/23/2023 09:03 PM   Modules accepted: Orders

## 2023-10-24 MED ORDER — ALBUTEROL SULFATE HFA 108 (90 BASE) MCG/ACT IN AERS
2.0000 | INHALATION_SPRAY | Freq: Four times a day (QID) | RESPIRATORY_TRACT | 0 refills | Status: DC | PRN
Start: 2023-10-24 — End: 2023-11-01

## 2023-10-24 NOTE — Addendum Note (Signed)
 Addended by: DORINA DALLAS HERO on: 10/24/2023 09:45 PM   Modules accepted: Orders

## 2023-10-30 ENCOUNTER — Other Ambulatory Visit: Payer: Self-pay | Admitting: Family Medicine

## 2023-10-30 DIAGNOSIS — J302 Other seasonal allergic rhinitis: Secondary | ICD-10-CM

## 2023-11-01 ENCOUNTER — Ambulatory Visit (INDEPENDENT_AMBULATORY_CARE_PROVIDER_SITE_OTHER)

## 2023-11-01 ENCOUNTER — Other Ambulatory Visit: Payer: Self-pay | Admitting: Medical

## 2023-11-01 VITALS — BP 124/74 | HR 98 | Temp 98.2°F | Ht 64.0 in | Wt 198.4 lb

## 2023-11-01 DIAGNOSIS — J309 Allergic rhinitis, unspecified: Secondary | ICD-10-CM | POA: Diagnosis not present

## 2023-11-01 DIAGNOSIS — E66811 Obesity, class 1: Secondary | ICD-10-CM | POA: Diagnosis not present

## 2023-11-01 DIAGNOSIS — Z789 Other specified health status: Secondary | ICD-10-CM | POA: Diagnosis not present

## 2023-11-01 DIAGNOSIS — J4551 Severe persistent asthma with (acute) exacerbation: Secondary | ICD-10-CM

## 2023-11-01 DIAGNOSIS — R0602 Shortness of breath: Secondary | ICD-10-CM | POA: Diagnosis not present

## 2023-11-01 DIAGNOSIS — Z6834 Body mass index (BMI) 34.0-34.9, adult: Secondary | ICD-10-CM

## 2023-11-01 DIAGNOSIS — G4733 Obstructive sleep apnea (adult) (pediatric): Secondary | ICD-10-CM

## 2023-11-01 LAB — CBC WITH DIFFERENTIAL/PLATELET
Basophils Absolute: 0 K/uL (ref 0.0–0.1)
Basophils Relative: 0.5 % (ref 0.0–3.0)
Eosinophils Absolute: 0.2 K/uL (ref 0.0–0.7)
Eosinophils Relative: 2 % (ref 0.0–5.0)
HCT: 41.8 % (ref 36.0–46.0)
Hemoglobin: 14 g/dL (ref 12.0–15.0)
Lymphocytes Relative: 22.3 % (ref 12.0–46.0)
Lymphs Abs: 1.8 K/uL (ref 0.7–4.0)
MCHC: 33.6 g/dL (ref 30.0–36.0)
MCV: 95 fl (ref 78.0–100.0)
Monocytes Absolute: 0.6 K/uL (ref 0.1–1.0)
Monocytes Relative: 7.3 % (ref 3.0–12.0)
Neutro Abs: 5.5 K/uL (ref 1.4–7.7)
Neutrophils Relative %: 67.9 % (ref 43.0–77.0)
Platelets: 299 K/uL (ref 150.0–400.0)
RBC: 4.39 Mil/uL (ref 3.87–5.11)
RDW: 12.4 % (ref 11.5–15.5)
WBC: 8.1 K/uL (ref 4.0–10.5)

## 2023-11-01 MED ORDER — AZITHROMYCIN 250 MG PO TABS: ORAL_TABLET | ORAL | 0 refills | Status: DC

## 2023-11-01 MED ORDER — ALBUTEROL SULFATE HFA 108 (90 BASE) MCG/ACT IN AERS: 2.0000 | INHALATION_SPRAY | RESPIRATORY_TRACT | 5 refills | Status: AC | PRN

## 2023-11-01 MED ORDER — MOMETASONE FURO-FORMOTEROL FUM 200-5 MCG/ACT IN AERO: 2.0000 | INHALATION_SPRAY | Freq: Two times a day (BID) | RESPIRATORY_TRACT | 5 refills | Status: AC

## 2023-11-01 MED ORDER — IPRATROPIUM BROMIDE 0.06 % NA SOLN: 2.0000 | Freq: Three times a day (TID) | NASAL | Status: AC

## 2023-11-01 MED ORDER — PREDNISONE 10 MG PO TABS: ORAL_TABLET | ORAL | 0 refills | Status: DC

## 2023-11-01 NOTE — Progress Notes (Signed)
 Established Patient Pulmonology Office Visit   Subjective:  Patient ID: Vicki Le, female    DOB: Dec 07, 1965  MRN: [id number]  CC:  Chief Complaint  Patient presents with   Shortness of Breath    Trouble getting a deep breath, sinus pressure and drainage, cough with green sputum. Sx x 5 weeks.    Shortness of Breath  58 year old female, never smoker followed for moderate OSA and allergic rhinitis and shortness of breath.  She used to follow with Dr Shellia and Comer Rouleau here- last visit 10/2021.   Home sleep study. 08/22/2021: moderate OSA, AHI 28.3; SPO2 low 71%, average 93% As per last visit note, she was switched at [facility] and a new facemask was recommended as she had leak on nasal pillow. Pt reports she tried using it for 6 months and couldn't continue due to clautrophobia and is not interested to use it anymore   She reports new symptoms of shortness of breath and chest tightness for last 4 weeks- has had Xray which was within normal limits. She has been treated with medrol  dose pack, augmentin . She has chronic allergic rhinosinusitis and takes Flonase  nasal spray, Allegra , Xyzal  and Singulair  at bedtime.  No issues with her breathing.  She does occasionally have a throat tickle and dry cough, which she correlates to her postnasal drainage. She has had eosinophils upto 500 in the past and had allergy  clinic referral. She reports testing positive for cats and dogs. She has a cat at home for last 9 years and is unwilling to give up the cat.  She is a non smoker but reports exposure to second hand smoke in office. She works in Audiological scientist for [employer] She lives with her husband at home  Review of system positive for shortness of breath with rest and activity, productive cough, nonproductive cough, chest pain, nasal congestion, anxiety.     Review of symptoms negative except mentioned above   Current Outpatient Medications:    Ascorbic Acid (VITAMIN C)  1000 MG tablet, Take 1,000 mg by mouth in the morning and at bedtime., Disp: , Rfl:    azithromycin  (ZITHROMAX ) 250 MG tablet, Take 2 tab on day 1, then take 1 tab daily for next 4 days, Disp: 6 tablet, Rfl: 0   benzonatate  (TESSALON ) 100 MG capsule, Take 1 capsule (100 mg total) by mouth 3 (three) times daily as needed for cough., Disp: 30 capsule, Rfl: 0   buPROPion  (WELLBUTRIN  XL) 150 MG 24 hr tablet, Take 3 tablets (450 mg total) by mouth daily., Disp: 270 tablet, Rfl: 0   cholecalciferol (VITAMIN D) 1000 UNITS tablet, Take 1,000 Units by mouth every evening., Disp: , Rfl:    cyanocobalamin (VITAMIN B12) 1000 MCG tablet, Take 1,000 mcg by mouth every evening., Disp: , Rfl:    fexofenadine  (ALLEGRA  ALLERGY ) 180 MG tablet, Take 1 tablet (180 mg total) by mouth daily., Disp: , Rfl:    fluticasone  (FLONASE ) 50 MCG/ACT nasal spray, Place 2 sprays into both nostrils daily., Disp: 16 g, Rfl: 1   guaiFENesin -codeine  100-10 MG/5ML syrup, 5ml po every 6 hours prn cough, Disp: 120 mL, Rfl: 0   levocetirizine (XYZAL ) 5 MG tablet, Take 1 tablet (5 mg total) by mouth every evening., Disp: 30 tablet, Rfl: 5   MIMVEY 1-0.5 MG tablet, Take 1 tablet by mouth every evening., Disp: , Rfl:    mometasone -formoterol  (DULERA) 200-5 MCG/ACT AERO, Inhale 2 puffs into the lungs in the morning and at bedtime.,  Disp: 13 each, Rfl: 5   montelukast  (SINGULAIR ) 10 MG tablet, Take 1 tablet (10 mg total) by mouth at bedtime., Disp: 90 tablet, Rfl: 0   pantoprazole  (PROTONIX ) 40 MG tablet, TAKE 1 TABLET BY MOUTH DAILY., Disp: 90 tablet, Rfl: 1   predniSONE  (DELTASONE ) 10 MG tablet, Take 4 tab on day 1,2,3,4; take 3 tab a day on day 5,6,7,8; take 2 tab a day on day 9,10,11,12; take 1 tab a day on day 13, 14 and then stop., Disp: 40 tablet, Rfl: 0   valACYclovir (VALTREX) 500 MG tablet, Take 500 mg by mouth daily as needed (outbreaks)., Disp: , Rfl:    zinc gluconate 50 MG tablet, Take 50 mg by mouth every evening., Disp: , Rfl:     albuterol  (VENTOLIN  HFA) 108 (90 Base) MCG/ACT inhaler, Inhale 2 puffs into the lungs every 4 (four) hours as needed., Disp: 17 g, Rfl: 5   Azelastine  HCl 137 MCG/SPRAY SOLN, PLACE 2 SPRAYS INTO BOTH NOSTRILS 2 TIMES DAILY. USE IN EACH NOSTRIL AS DIRECTED, Disp: 0.137 mL, Rfl: 1  Current Facility-Administered Medications:    ipratropium (ATROVENT ) 0.06 % nasal spray 2 spray, 2 spray, Each Nare, TID,       Objective:  BP 124/74   Pulse 98   Temp 98.2 F (36.8 C) (Oral)   Ht 5' 4 (1.626 m)   Wt 198 lb 6.4 oz (90 kg)   LMP 02/09/2016   SpO2 98% Comment: room air  BMI 34.06 kg/m    Physical Exam Constitutional:      General: She is not in acute distress.    Appearance: Normal appearance.  HENT:     Nose:     Comments: congested    Mouth/Throat:     Mouth: Mucous membranes are moist.  Eyes:     Comments: Runny  Cardiovascular:     Rate and Rhythm: Normal rate.  Pulmonary:     Effort: No respiratory distress.     Breath sounds: No wheezing or rales.  Musculoskeletal:     Right lower leg: No edema.     Left lower leg: No edema.  Skin:    General: Skin is warm.  Neurological:     Mental Status: She is alert and oriented to person, place, and time.  Psychiatric:        Mood and Affect: Mood normal.    Diagnostic Review:   Chest xray 10/2023 within normal limits   Allergy  test 11/2021 Cat hair, dog epithelia and tobacco leaf  Reviewed prior clinic notes from Dr Shellia, NP Malachy  Reviewed referral notes     Assessment & Plan:   Assessment & Plan Severe persistent asthma with exacerbation Uncontrolled, new diagnosis Symptoms remain uncontrolled  Will need prolonged steroid taper Will also treat for atypicals, as she still has green sputum. Has tolerated z pack in the past Schedule PFT Check CBC/DC, IGE today Start dulera. Prn albuterol . She believes symbicort  was expensive and not covered  Orders:   CBC with Differential   IgE; Future   Pulmonary Function  Test; Future   mometasone -formoterol  (DULERA) 200-5 MCG/ACT AERO; Inhale 2 puffs into the lungs in the morning and at bedtime.   albuterol  (VENTOLIN  HFA) 108 (90 Base) MCG/ACT inhaler; Inhale 2 puffs into the lungs every 4 (four) hours as needed.   predniSONE  (DELTASONE ) 10 MG tablet; Take 4 tab on day 1,2,3,4; take 3 tab a day on day 5,6,7,8; take 2 tab a day on day 9,10,11,12; take  1 tab a day on day 13, 14 and then stop.   azithromycin  (ZITHROMAX ) 250 MG tablet; Take 2 tab on day 1, then take 1 tab daily for next 4 days  Allergic rhinitis, unspecified seasonality, unspecified trigger Switch allegra  and zyrtec to claritin  and xyzal  Continue flonase  and astelin  Start atrovent  nasal spray Orders:   CBC with Differential   IgE; Future   Pulmonary Function Test; Future   ipratropium (ATROVENT ) 0.06 % nasal spray 2 spray  OSA (obstructive sleep apnea) Moderate Pt still symptomatic but can't use cpap Will discuss inspire in next visits    Intolerance of continuous positive airway pressure (CPAP) ventilation Pt unwilling to try again Currently uncontrolled sinus symptoms    Obesity, Class I, BMI 30.0-34.9 (see actual BMI) Recommend weight loss    SOB (shortness of breath) Likely due to uncontrolled asthma. Weight likely contributing as well Orders:   CBC with Differential   IgE; Future   Pulmonary Function Test; Future   ipratropium (ATROVENT ) 0.06 % nasal spray 2 spray   Thank you for the opportunity to take part in the care of this patient   Return in about 4 weeks (around 11/29/2023).   Lamone Ferrelli Pleas, MD

## 2023-11-01 NOTE — Assessment & Plan Note (Signed)
Recommend weight loss.  

## 2023-11-01 NOTE — Patient Instructions (Signed)
 Labs today PFT in few weeks Please rinse mouth after inhaler use

## 2023-11-01 NOTE — Progress Notes (Deleted)
 @Patient  ID: Vicki Le, female    DOB: Jun 26, 1965, 58 y.o.   MRN: [id number]  Chief Complaint  Patient presents with   Shortness of Breath    Trouble getting a deep breath, sinus pressure and drainage, cough with green sputum. Sx x 5 weeks.    Referring provider: Dorina Dallas RIGGERS  HPI: 58 year old female, never smoker followed for moderate OSA and allergic rhinitis and shortness of breath.  She used to follow with Dr Shellia and Comer Rouleau here- last visit 10/2021.  Home sleep study. 08/22/2021: moderate OSA, AHI 28.3; SPO2 low 71%, average 93% As per last visit note, she was switched at [facility] and a new facemask was recommended as she had leak on nasal pilloe  She takes Flonase  nasal spray, Allegra , Xyzal  and Singulair  at bedtime.  No issues with her breathing.  She does occasionally have a throat tickle and dry cough, which she correlates to her postnasal drainage.  Allergies  Allergen Reactions   Hydrocodone  Nausea And Vomiting    Immunization History  Administered Date(s) Administered   Tdap 10/03/2014    Past Medical History:  Diagnosis Date   Anxiety    Chicken pox    Depression    GERD (gastroesophageal reflux disease)    Hypertension    no meds   Melanoma (HCC)    left buttock   Pneumonia    Sleep apnea    no CPAP   claustrophobic   Urticaria     Tobacco History: Social History   Tobacco Use  Smoking Status Never   Passive exposure: Never  Smokeless Tobacco Never   Counseling given: Not Answered   Outpatient Medications Prior to Visit  Medication Sig Dispense Refill   Ascorbic Acid (VITAMIN C) 1000 MG tablet Take 1,000 mg by mouth in the morning and at bedtime.     benzonatate  (TESSALON ) 100 MG capsule Take 1 capsule (100 mg total) by mouth 3 (three) times daily as needed for cough. 30 capsule 0   buPROPion  (WELLBUTRIN  XL) 150 MG 24 hr tablet Take 3 tablets (450 mg total) by mouth daily. 270 tablet 0   cholecalciferol (VITAMIN D) 1000  UNITS tablet Take 1,000 Units by mouth every evening.     cyanocobalamin (VITAMIN B12) 1000 MCG tablet Take 1,000 mcg by mouth every evening.     fexofenadine  (ALLEGRA  ALLERGY ) 180 MG tablet Take 1 tablet (180 mg total) by mouth daily.     fluticasone  (FLONASE ) 50 MCG/ACT nasal spray Place 2 sprays into both nostrils daily. 16 g 1   guaiFENesin -codeine  100-10 MG/5ML syrup 5ml po every 6 hours prn cough 120 mL 0   levocetirizine (XYZAL ) 5 MG tablet Take 1 tablet (5 mg total) by mouth every evening. 30 tablet 5   MIMVEY 1-0.5 MG tablet Take 1 tablet by mouth every evening.     montelukast  (SINGULAIR ) 10 MG tablet Take 1 tablet (10 mg total) by mouth at bedtime. 90 tablet 0   pantoprazole  (PROTONIX ) 40 MG tablet TAKE 1 TABLET BY MOUTH DAILY. 90 tablet 1   valACYclovir (VALTREX) 500 MG tablet Take 500 mg by mouth daily as needed (outbreaks).     zinc gluconate 50 MG tablet Take 50 mg by mouth every evening.     albuterol  (VENTOLIN  HFA) 108 (90 Base) MCG/ACT inhaler Inhale 2 puffs into the lungs every 6 (six) hours as needed. 18 g 0   azelastine  (ASTELIN ) 0.1 % nasal spray Place 2 sprays into both nostrils 2 (two)  times daily. Use in each nostril as directed 30 mL 0   amoxicillin -clavulanate (AUGMENTIN ) 875-125 MG tablet Take 1 tablet by mouth 2 (two) times daily. 20 tablet 0   fluconazole  (DIFLUCAN ) 150 MG tablet 1 tab at onset yeast infection. Repeat in 5 days if needed 2 tablet 0   fluticasone -salmeterol (ADVAIR) 100-50 MCG/ACT AEPB Inhale 1 puff into the lungs 2 (two) times daily. 1 each 3   fluticasone -salmeterol (ADVAIR) 250-50 MCG/ACT AEPB Inhale 1 puff into the lungs in the morning and at bedtime. 1 each 0   methylPREDNISolone  (MEDROL  DOSEPAK) 4 MG TBPK tablet Standard 6 day taper dose pack 21 tablet 0   No facility-administered medications prior to visit.     Review of Systems:   Constitutional: No weight loss or gain, night sweats, fevers, chills. + Fatigue HEENT: No headaches,  difficulty swallowing, tooth/dental problems, or sore throat. +sneezing, itching, nasal congestion, post nasal drip CV:  No chest pain, orthopnea, PND, swelling in lower extremities, anasarca, dizziness, palpitations, syncope Resp: + Snoring, witnessed apneas, occasional dry cough.  No shortness of breath with exertion or at rest. No excess mucus or change in color of mucus. No hemoptysis. No wheezing.  No chest wall deformity GI:  No heartburn, indigestion, abdominal pain, nausea, vomiting, diarrhea, change in bowel habits, loss of appetite, bloody stools.  Skin: No rash, lesions, ulcerations MSK:  No joint pain or swelling.  No decreased range of motion.  No back pain. Neuro: No dizziness or lightheadedness.  Psych: No depression or anxiety. Mood stable.     Physical Exam:  BP 124/74   Pulse 98   Temp 98.2 F (36.8 C) (Oral)   Ht 5' 4 (1.626 m)   Wt 198 lb 6.4 oz (90 kg)   LMP 02/09/2016   SpO2 98% Comment: room air  BMI 34.06 kg/m   GEN: Pleasant, interactive, well-appearing; obese; in no acute distress. HEENT:  Normocephalic and atraumatic. PERRLA. Sclera white. Nasal turbinates erythematous, moist and patent bilaterally.  Clear rhinorrhea present. Oropharynx erythematous and moist, without exudate or edema. No lesions, ulcerations NECK:  Supple w/ fair ROM. No JVD present. Normal carotid impulses w/o bruits. Thyroid symmetrical with no goiter or nodules palpated. No lymphadenopathy.   CV: RRR, no m/r/g, no peripheral edema. Pulses intact, +2 bilaterally. No cyanosis, pallor or clubbing. PULMONARY:  Unlabored, regular breathing. Clear bilaterally A&P w/o wheezes/rales/rhonchi. No accessory muscle use. No dullness to percussion. GI: BS present and normoactive. Soft, non-tender to palpation. No organomegaly or masses detected. No CVA tenderness. MSK: No erythema, warmth or tenderness. Cap refil <2 sec all extrem. No deformities or joint swelling noted.  Neuro: A/Ox3. No focal  deficits noted.   Skin: Warm, no lesions or rashe Psych: Normal affect and behavior. Judgement and thought content appropriate.     Lab Results:  CBC    Component Value Date/Time   WBC 8.0 07/30/2023 0940   RBC 4.24 07/30/2023 0940   HGB 13.9 07/30/2023 0940   HCT 43.2 07/30/2023 0940   PLT 272 07/30/2023 0940   MCV 101.9 (H) 07/30/2023 0940   MCH 32.8 07/30/2023 0940   MCHC 32.2 07/30/2023 0940   RDW 12.6 07/30/2023 0940   LYMPHSABS 1.8 06/11/2023 1031   MONOABS 0.5 06/11/2023 1031   EOSABS 0.2 06/11/2023 1031   BASOSABS 0.1 06/11/2023 1031    BMET    Component Value Date/Time   NA 138 07/30/2023 0940   K 3.7 07/30/2023 0940   CL 104 07/30/2023 0940  CO2 23 07/30/2023 0940   GLUCOSE 95 07/30/2023 0940   BUN 11 07/30/2023 0940   CREATININE 0.50 07/30/2023 0940   CREATININE 0.62 12/11/2019 0952   CALCIUM 8.5 (L) 07/30/2023 0940   GFRNONAA >60 07/30/2023 0940   GFRAA >60 10/11/2014 1640    BNP No results found for: BNP   Imaging:  DG Chest 2 View Result Date: 10/10/2023 CLINICAL DATA:  persisting chest congestion and cough since mid august. EXAM: CHEST - 2 VIEW COMPARISON:  October 02, 2023 FINDINGS: No focal airspace consolidation, pleural effusion, or pneumothorax. No cardiomegaly. No acute fracture or destructive lesions. Multilevel thoracic osteophytosis. IMPRESSION: No acute cardiopulmonary abnormality. Electronically Signed   By: Rogelia Myers M.D.   On: 10/10/2023 10:24    methylPREDNISolone  acetate (DEPO-MEDROL ) injection 40 mg     Date Action Dose Route User   10/04/2023 1106 Given 40 mg Intramuscular (Left Deltoid) Fergerson, Tricia A, CMA           No data to display          No results found for: NITRICOXIDE  Chest xray 10/2023 within normal limits   Allergy  test 11/2021 Cat hair, dog epithelia and tobacco leaf  Assessment & Plan:   No problem-specific Assessment & Plan notes found for this encounter.    I spent 35 minutes  of dedicated to the care of this patient on the date of this encounter to include pre-visit review of records, face-to-face time with the patient discussing conditions above, post visit ordering of testing, clinical documentation with the electronic health record, making appropriate referrals as documented, and communicating necessary findings to members of the patients care team.  Carter Helling, MD 11/01/2023  Pt aware and understands NP's role.

## 2023-11-05 LAB — IGE: IgE (Immunoglobulin E), Serum: 64 kU/L (ref <=114)

## 2023-11-06 ENCOUNTER — Ambulatory Visit: Payer: Self-pay

## 2023-11-06 NOTE — Telephone Encounter (Signed)
 Patient is aware of results and nfn

## 2023-11-06 NOTE — Telephone Encounter (Signed)
-----   Message from Dipti Baral sent at 11/06/2023 11:37 AM EDT ----- Please let the patient know that their labs are within normal limits.  ----- Message ----- From: Interface, Lab In Three Zero One Sent: 11/01/2023  12:14 PM EDT To: Dipti Pleas, MD

## 2023-11-24 ENCOUNTER — Other Ambulatory Visit: Payer: Self-pay | Admitting: Medical

## 2023-11-25 ENCOUNTER — Encounter: Payer: Self-pay | Admitting: Medical

## 2023-11-25 ENCOUNTER — Other Ambulatory Visit: Payer: Self-pay | Admitting: Family Medicine

## 2023-11-25 DIAGNOSIS — J302 Other seasonal allergic rhinitis: Secondary | ICD-10-CM

## 2023-11-26 ENCOUNTER — Ambulatory Visit (INDEPENDENT_AMBULATORY_CARE_PROVIDER_SITE_OTHER): Admitting: *Deleted

## 2023-11-26 ENCOUNTER — Telehealth: Payer: Self-pay

## 2023-11-26 ENCOUNTER — Other Ambulatory Visit: Payer: Self-pay | Admitting: Family Medicine

## 2023-11-26 DIAGNOSIS — J4550 Severe persistent asthma, uncomplicated: Secondary | ICD-10-CM | POA: Diagnosis not present

## 2023-11-26 DIAGNOSIS — J309 Allergic rhinitis, unspecified: Secondary | ICD-10-CM

## 2023-11-26 DIAGNOSIS — R0602 Shortness of breath: Secondary | ICD-10-CM

## 2023-11-26 DIAGNOSIS — J4551 Severe persistent asthma with (acute) exacerbation: Secondary | ICD-10-CM

## 2023-11-26 DIAGNOSIS — J302 Other seasonal allergic rhinitis: Secondary | ICD-10-CM

## 2023-11-26 LAB — PULMONARY FUNCTION TEST
DL/VA % pred: 129 %
DL/VA: 5.49 ml/min/mmHg/L
DLCO cor % pred: 127 %
DLCO cor: 25.93 ml/min/mmHg
DLCO unc % pred: 129 %
DLCO unc: 26.4 ml/min/mmHg
FEF 25-75 Post: 3.72 L/s
FEF 25-75 Pre: 3.01 L/s
FEF2575-%Change-Post: 23 %
FEF2575-%Pred-Post: 155 %
FEF2575-%Pred-Pre: 125 %
FEV1-%Change-Post: 7 %
FEV1-%Pred-Post: 108 %
FEV1-%Pred-Pre: 100 %
FEV1-Post: 2.81 L
FEV1-Pre: 2.61 L
FEV1FVC-%Change-Post: 0 %
FEV1FVC-%Pred-Pre: 105 %
FEV6-%Change-Post: 7 %
FEV6-%Pred-Post: 105 %
FEV6-%Pred-Pre: 97 %
FEV6-Post: 3.39 L
FEV6-Pre: 3.14 L
FEV6FVC-%Change-Post: 0 %
FEV6FVC-%Pred-Post: 103 %
FEV6FVC-%Pred-Pre: 103 %
FVC-%Change-Post: 7 %
FVC-%Pred-Post: 101 %
FVC-%Pred-Pre: 94 %
FVC-Post: 3.4 L
FVC-Pre: 3.15 L
Post FEV1/FVC ratio: 83 %
Post FEV6/FVC ratio: 100 %
Pre FEV1/FVC ratio: 83 %
Pre FEV6/FVC Ratio: 100 %
RV % pred: 113 %
RV: 2.24 L
TLC % pred: 115 %
TLC: 5.82 L

## 2023-11-26 MED ORDER — AZELASTINE HCL 0.05 % OP SOLN: 1.0000 [drp] | Freq: Two times a day (BID) | OPHTHALMIC | 3 refills | Status: AC

## 2023-11-26 NOTE — Telephone Encounter (Signed)
 Called pt back with an answer. She said dr antonio rx her azelastine  eye drops but denied her request to have it filled. Wanted to know if [facility] could fill it with a 90 day supply. She is having itchy watery eyes. Told her I would let [facility] saguier know and would contact her via mychart at her request

## 2023-11-26 NOTE — Progress Notes (Signed)
 Full PFT performed today.

## 2023-11-26 NOTE — Telephone Encounter (Signed)
 Called pt with no answer will write back via mychart

## 2023-11-26 NOTE — Addendum Note (Signed)
 Addended by: DORINA DALLAS HERO on: 11/26/2023 01:01 PM   Modules accepted: Orders

## 2023-11-26 NOTE — Patient Instructions (Signed)
 Full PFT performed today.

## 2023-12-02 ENCOUNTER — Ambulatory Visit (INDEPENDENT_AMBULATORY_CARE_PROVIDER_SITE_OTHER)

## 2023-12-02 VITALS — BP 135/89 | HR 97 | Temp 98.7°F | Ht 64.0 in | Wt 202.0 lb

## 2023-12-02 DIAGNOSIS — J309 Allergic rhinitis, unspecified: Secondary | ICD-10-CM

## 2023-12-02 DIAGNOSIS — J4530 Mild persistent asthma, uncomplicated: Secondary | ICD-10-CM

## 2023-12-02 DIAGNOSIS — R040 Epistaxis: Secondary | ICD-10-CM | POA: Diagnosis not present

## 2023-12-02 NOTE — Progress Notes (Signed)
 Established Patient Pulmonology Office Visit   Subjective 10/2023  Patient ID: Vicki Le, female    DOB: [birth year]  MRN: [id number]  CC:  Chief Complaint  Patient presents with   Shortness of Breath    PFT result     Shortness of Breath  58 year old female, never smoker followed for moderate OSA and allergic rhinitis and shortness of breath.  She used to follow with Dr Shellia and Comer Rouleau here - last visit 10/2021.   Home sleep study. 08/22/2021: moderate OSA, AHI 28.3; SPO2 low 71%, average 93% As per last visit note, she was switched at [facility] and a new facemask was recommended as she had leak on nasal pillow. Pt reports she tried using it for 6 months and couldn't continue due to clautrophobia and is not interested to use it anymore   She reports new symptoms of shortness of breath and chest tightness for last 4 weeks- has had Xray which was within normal limits. She has been treated with medrol  dose pack, augmentin . She has chronic allergic rhinosinusitis and takes Flonase  nasal spray, Allegra , Xyzal  and Singulair  at bedtime.  No issues with her breathing.  She does occasionally have a throat tickle and dry cough, which she correlates to her postnasal drainage. She has had eosinophils upto 500 in the past and had allergy  clinic referral. She reports testing positive for cats and dogs. She has a cat at home for last 9 years and is unwilling to give up the cat.  She is a non smoker but reports exposure to second hand smoke in office. She works in audiological scientist for [employer] She lives with her husband at home  Review of system positive for shortness of breath with rest and activity, productive cough, nonproductive cough, chest pain, nasal congestion, anxiety.    Subjective:   10/27- the steroids and z pack combination did clear her bronchitis  Uses claritin  and xyzal  daily Also using flonase , astelin  and atrovent  nasal spray Had epistaxis yesterday-  stopped with pressure at home, this has happened in the last also Follows with ENT for chronic sinusitis Has been using dulera since last visit Completed PFT, here to review results   Current Outpatient Medications:    albuterol  (VENTOLIN  HFA) 108 (90 Base) MCG/ACT inhaler, Inhale 2 puffs into the lungs every 4 (four) hours as needed., Disp: 17 g, Rfl: 5   Ascorbic Acid (VITAMIN C) 1000 MG tablet, Take 1,000 mg by mouth in the morning and at bedtime., Disp: , Rfl:    azelastine  (OPTIVAR ) 0.05 % ophthalmic solution, Place 1 drop into both eyes 2 (two) times daily., Disp: 6 mL, Rfl: 3   Azelastine  HCl 137 MCG/SPRAY SOLN, PLACE 2 SPRAYS INTO BOTH NOSTRILS 2 TIMES DAILY. USE IN EACH NOSTRIL AS DIRECTED, Disp: 30 mL, Rfl: 1   benzonatate  (TESSALON ) 100 MG capsule, Take 1 capsule (100 mg total) by mouth 3 (three) times daily as needed for cough., Disp: 30 capsule, Rfl: 0   buPROPion  (WELLBUTRIN  XL) 150 MG 24 hr tablet, Take 3 tablets (450 mg total) by mouth daily., Disp: 270 tablet, Rfl: 0   cholecalciferol (VITAMIN D) 1000 UNITS tablet, Take 1,000 Units by mouth every evening., Disp: , Rfl:    cyanocobalamin (VITAMIN B12) 1000 MCG tablet, Take 1,000 mcg by mouth every evening., Disp: , Rfl:    fexofenadine  (ALLEGRA  ALLERGY ) 180 MG tablet, Take 1 tablet (180 mg total) by mouth daily., Disp: , Rfl:    fluticasone  (FLONASE )  50 MCG/ACT nasal spray, Place 2 sprays into both nostrils daily., Disp: 16 g, Rfl: 1   levocetirizine (XYZAL ) 5 MG tablet, Take 1 tablet (5 mg total) by mouth every evening., Disp: 30 tablet, Rfl: 5   MIMVEY 1-0.5 MG tablet, Take 1 tablet by mouth every evening., Disp: , Rfl:    mometasone -formoterol  (DULERA) 200-5 MCG/ACT AERO, Inhale 2 puffs into the lungs in the morning and at bedtime., Disp: 13 each, Rfl: 5   montelukast  (SINGULAIR ) 10 MG tablet, Take 1 tablet (10 mg total) by mouth at bedtime., Disp: 90 tablet, Rfl: 0   pantoprazole  (PROTONIX ) 40 MG tablet, TAKE 1 TABLET BY  MOUTH DAILY., Disp: 90 tablet, Rfl: 1   valACYclovir (VALTREX) 500 MG tablet, Take 500 mg by mouth daily as needed (outbreaks)., Disp: , Rfl:    zinc gluconate 50 MG tablet, Take 50 mg by mouth every evening., Disp: , Rfl:    azithromycin  (ZITHROMAX ) 250 MG tablet, Take 2 tab on day 1, then take 1 tab daily for next 4 days (Patient not taking: Reported on 12/02/2023), Disp: 6 tablet, Rfl: 0   guaiFENesin -codeine  100-10 MG/5ML syrup, 5ml po every 6 hours prn cough, Disp: 120 mL, Rfl: 0   predniSONE  (DELTASONE ) 10 MG tablet, Take 4 tab on day 1,2,3,4; take 3 tab a day on day 5,6,7,8; take 2 tab a day on day 9,10,11,12; take 1 tab a day on day 13, 14 and then stop. (Patient not taking: Reported on 12/02/2023), Disp: 40 tablet, Rfl: 0  Current Facility-Administered Medications:    ipratropium (ATROVENT ) 0.06 % nasal spray 2 spray, 2 spray, Each Nare, TID,       Objective:  BP 135/89   Pulse 97   Temp 98.7 F (37.1 C) (Oral)   Ht 5' 4 (1.626 m)   Wt 202 lb (91.6 kg)   LMP 02/09/2016   SpO2 99%   BMI 34.67 kg/m    Physical Exam Constitutional:      General: She is not in acute distress.    Appearance: Normal appearance.  HENT:     Nose:     Comments: congested    Mouth/Throat:     Mouth: Mucous membranes are moist.  Eyes:     Comments: Nose-erythematous with evidence of recent bleed  Cardiovascular:     Rate and Rhythm: Normal rate.  Pulmonary:     Effort: No respiratory distress.     Breath sounds: No wheezing or rales.  Musculoskeletal:     Right lower leg: No edema.     Left lower leg: No edema.  Skin:    General: Skin is warm.  Neurological:     Mental Status: She is alert and oriented to person, place, and time.  Psychiatric:        Mood and Affect: Mood normal.    Diagnostic Review:   PFT 11/26/2023 No obstruction, upto 7% BD response TLC 115, RV 113% DLCO 129%  Chest xray 10/2023 within normal limits   Allergy  test 11/2021 Cat hair, dog epithelia and  tobacco leaf  Reviewed prior clinic notes from Dr Shellia, NP Malachy  Reviewed referral notes     Assessment & Plan:   Assessment & Plan Allergic rhinitis, unspecified seasonality, unspecified trigger Continue claritin  and xyzal  Continue astelin  and atrovent  nasal spray She will take a break from flonase  as she has used for many years    Mild persistent asthma, unspecified whether complicated Discussed PFT results with the pt Continue dulera BID  I advised pt to rinse mouth after inhaler use     Epistaxis Likely from dryness, long term flonase  use and/or allergic rhinitis Advised pt to get humidifier at home Use pressure and afrin if happens next time and see ENT Discussed the symptoms and management of anterior and posterior nose bleeds     Thank you for the opportunity to take part in the care of this patient.   Return in about 6 months (around 06/01/2024).   Loanne Emery Pleas, MD

## 2023-12-02 NOTE — Patient Instructions (Addendum)
 It was a pleasure to see you today. Continue using claritin  and xyzal  daily Use  astelin  and atrovent  nasal spray Try afrin spray if nose bleed happens again and use humidifier at home Contact your ENT if nose bleeding re-occurs

## 2023-12-08 ENCOUNTER — Encounter: Payer: Self-pay | Admitting: Medical

## 2023-12-21 ENCOUNTER — Other Ambulatory Visit: Payer: Self-pay | Admitting: Medical

## 2023-12-31 ENCOUNTER — Encounter: Admitting: Medical

## 2024-01-12 ENCOUNTER — Other Ambulatory Visit: Payer: Self-pay | Admitting: Family Medicine

## 2024-01-12 DIAGNOSIS — F32A Depression, unspecified: Secondary | ICD-10-CM

## 2024-01-13 ENCOUNTER — Other Ambulatory Visit: Payer: Self-pay | Admitting: Medical

## 2024-01-13 DIAGNOSIS — F32A Depression, unspecified: Secondary | ICD-10-CM

## 2024-01-14 ENCOUNTER — Other Ambulatory Visit: Payer: Self-pay

## 2024-01-14 ENCOUNTER — Encounter: Payer: Self-pay | Admitting: Medical

## 2024-01-14 DIAGNOSIS — F32A Depression, unspecified: Secondary | ICD-10-CM

## 2024-01-14 MED ORDER — BUPROPION HCL ER (XL) 150 MG PO TB24: 450.0000 mg | ORAL_TABLET | Freq: Every day | ORAL | 0 refills | Status: AC

## 2024-01-14 NOTE — Telephone Encounter (Signed)
 Called pt to clarify [facility] and was able to send over 90 day supply of wellbutrin 

## 2024-02-02 ENCOUNTER — Encounter: Payer: Self-pay | Admitting: Medical

## 2024-02-05 ENCOUNTER — Other Ambulatory Visit: Payer: Self-pay | Admitting: Family Medicine

## 2024-02-05 DIAGNOSIS — J302 Other seasonal allergic rhinitis: Secondary | ICD-10-CM

## 2024-02-10 ENCOUNTER — Ambulatory Visit: Admitting: Medical

## 2024-02-14 ENCOUNTER — Other Ambulatory Visit: Payer: Self-pay

## 2024-02-14 DIAGNOSIS — J4551 Severe persistent asthma with (acute) exacerbation: Secondary | ICD-10-CM

## 2024-02-19 ENCOUNTER — Ambulatory Visit: Payer: Self-pay | Admitting: Medical

## 2024-02-19 ENCOUNTER — Ambulatory Visit: Admitting: Family Medicine

## 2024-02-19 ENCOUNTER — Encounter: Payer: Self-pay | Admitting: Family Medicine

## 2024-02-19 VITALS — BP 136/80 | HR 100 | Temp 98.0°F | Resp 16 | Ht 64.0 in | Wt 204.8 lb

## 2024-02-19 DIAGNOSIS — J020 Streptococcal pharyngitis: Secondary | ICD-10-CM

## 2024-02-19 LAB — POCT RAPID STREP A (OFFICE): Rapid Strep A Screen: POSITIVE — AB

## 2024-02-19 MED ORDER — AMOXICILLIN 500 MG PO CAPS: 1000.0000 mg | ORAL_CAPSULE | Freq: Every day | ORAL | 0 refills | Status: AC

## 2024-02-19 MED ORDER — FLUCONAZOLE 150 MG PO TABS: ORAL_TABLET | ORAL | 0 refills | Status: AC

## 2024-02-19 NOTE — Patient Instructions (Addendum)
 Ibuprofen 400-600 mg (2-3 over the counter strength tabs) every 6 hours as needed for pain.  Consider throat lozenges, salt water gargles and an air humidifier for symptomatic care.   OK to take Tylenol  1000 mg (2 extra strength tabs) or 975 mg (3 regular strength tabs) every 6 hours as needed.  Throw out/replace toothbrush after 24 hours on antibiotics.   Let us  know if you need anything.

## 2024-02-19 NOTE — Progress Notes (Signed)
 SUBJECTIVE:   Vicki Le is a 59 y.o. female presents to the clinic for:  Chief Complaint  Patient presents with   Sore Throat    Sore Throat     Complains of sore throat for 2 weeks.  Other associated symptoms: sinus congestion, rhinorrhea, sore throat, and dry coughing.  Denies: itchy watery eyes, ear pain, ear drainage, myalgia, and fevers Sick Contacts: none known Therapy to date: antihistamines, nasal steroids, and cough drops, Mucinex   Tobacco Use History[1]  Patient's medications, allergies, past medical, surgical, social and family histories were reviewed and updated as appropriate.  OBJECTIVE:  BP 136/80 (BP Location: Left Arm, Patient Position: Sitting)   Pulse 100   Temp 98 F (36.7 C) (Oral)   Resp 16   Ht 5' 4 (1.626 m)   Wt 204 lb 12.8 oz (92.9 kg)   LMP 02/09/2016   SpO2 98%   BMI 35.15 kg/m  General: Awake, alert, appearing stated age Eyes: conjunctivae and sclerae clear Ears: normal TMs bilaterally Nose: no visible exudate Oropharynx: Tonsillar pillars are erythematous with exudate noted bilaterally, no asymmetry Neck: supple, no significant adenopathy Lungs: clear to auscultation, no wheezes, rales or rhonchi, symmetric air entry, normal effort Heart: RRR Skin:reveals no rash Psych: Age appropriate judgment and insight  ASSESSMENT/PLAN:  Strep throat - Plan: amoxicillin  (AMOXIL ) 500 MG capsule, POCT rapid strep A  Rapid strep test positive, 1/4 Cent[facility] criteria.  10 days of amoxicillin  1000 mg daily.  Continue to practice good hand hygiene and push fluids. Ibuprofen and acetaminophen  for pain. Replace toothbrush after 24 hours of being on abx. F/u prn. Pt voiced understanding and agreement to the plan.  Mabel Mt [place], [place] 02/19/2024 4:27 PM     [1]  Social History Tobacco Use  Smoking Status Never   Passive exposure: Never  Smokeless Tobacco Never

## 2024-02-21 ENCOUNTER — Ambulatory Visit: Admitting: Medical

## 2024-03-04 ENCOUNTER — Other Ambulatory Visit (HOSPITAL_COMMUNITY): Payer: Self-pay

## 2024-03-05 MED ORDER — BUDESONIDE-FORMOTEROL FUMARATE 160-4.5 MCG/ACT IN AERO: 2.0000 | INHALATION_SPRAY | Freq: Two times a day (BID) | RESPIRATORY_TRACT | 12 refills | Status: AC

## 2024-03-05 NOTE — Telephone Encounter (Signed)
 I Sent Vicki Le . generic symbicort  could be another option.  Breo and advair diskus are powders

## 2024-03-24 ENCOUNTER — Encounter: Admitting: Medical
